# Patient Record
Sex: Male | Born: 1935 | Race: White | Hispanic: No | Marital: Married | Smoking: Former smoker
Health system: Southern US, Community
[De-identification: ages and names within clinical notes are randomized; demographics above are authoritative.]

## PROBLEM LIST (undated history)

## (undated) DIAGNOSIS — I219 Acute myocardial infarction, unspecified: Secondary | ICD-10-CM

## (undated) DIAGNOSIS — I739 Peripheral vascular disease, unspecified: Secondary | ICD-10-CM

## (undated) DIAGNOSIS — Z9981 Dependence on supplemental oxygen: Secondary | ICD-10-CM

## (undated) DIAGNOSIS — C801 Malignant (primary) neoplasm, unspecified: Secondary | ICD-10-CM

## (undated) DIAGNOSIS — I779 Disorder of arteries and arterioles, unspecified: Secondary | ICD-10-CM

## (undated) DIAGNOSIS — I10 Essential (primary) hypertension: Secondary | ICD-10-CM

## (undated) DIAGNOSIS — Z950 Presence of cardiac pacemaker: Secondary | ICD-10-CM

## (undated) DIAGNOSIS — I480 Paroxysmal atrial fibrillation: Secondary | ICD-10-CM

## (undated) DIAGNOSIS — K2980 Duodenitis without bleeding: Secondary | ICD-10-CM

## (undated) DIAGNOSIS — E785 Hyperlipidemia, unspecified: Secondary | ICD-10-CM

## (undated) DIAGNOSIS — E119 Type 2 diabetes mellitus without complications: Secondary | ICD-10-CM

## (undated) DIAGNOSIS — J449 Chronic obstructive pulmonary disease, unspecified: Secondary | ICD-10-CM

## (undated) DIAGNOSIS — I2510 Atherosclerotic heart disease of native coronary artery without angina pectoris: Secondary | ICD-10-CM

## (undated) DIAGNOSIS — R0602 Shortness of breath: Secondary | ICD-10-CM

## (undated) HISTORY — DX: Presence of cardiac pacemaker: Z95.0

## (undated) HISTORY — DX: Atherosclerotic heart disease of native coronary artery without angina pectoris: I25.10

## (undated) HISTORY — DX: Paroxysmal atrial fibrillation: I48.0

## (undated) HISTORY — DX: Disorder of arteries and arterioles, unspecified: I77.9

## (undated) HISTORY — DX: Acute myocardial infarction, unspecified: I21.9

## (undated) HISTORY — DX: Dependence on supplemental oxygen: Z99.81

## (undated) HISTORY — DX: Duodenitis without bleeding: K29.80

## (undated) HISTORY — DX: Type 2 diabetes mellitus without complications: E11.9

## (undated) HISTORY — DX: Peripheral vascular disease, unspecified: I73.9

## (undated) HISTORY — DX: Shortness of breath: R06.02

## (undated) HISTORY — DX: Chronic obstructive pulmonary disease, unspecified: J44.9

## (undated) HISTORY — DX: Hyperlipidemia, unspecified: E78.5

## (undated) HISTORY — PX: OTHER SURGICAL HISTORY: SHX169

---

## 1989-04-10 DIAGNOSIS — I219 Acute myocardial infarction, unspecified: Secondary | ICD-10-CM

## 1989-04-10 HISTORY — DX: Acute myocardial infarction, unspecified: I21.9

## 1998-04-10 HISTORY — PX: CORONARY ARTERY BYPASS GRAFT: SHX141

## 2000-08-20 ENCOUNTER — Encounter: Payer: Self-pay | Admitting: Internal Medicine

## 2000-08-20 ENCOUNTER — Encounter: Admission: RE | Admit: 2000-08-20 | Discharge: 2000-08-20 | Payer: Self-pay | Admitting: Internal Medicine

## 2000-09-17 ENCOUNTER — Encounter (INDEPENDENT_AMBULATORY_CARE_PROVIDER_SITE_OTHER): Payer: Self-pay | Admitting: Specialist

## 2000-09-17 ENCOUNTER — Other Ambulatory Visit: Admission: RE | Admit: 2000-09-17 | Discharge: 2000-09-17 | Payer: Self-pay | Admitting: Urology

## 2001-08-22 ENCOUNTER — Encounter: Admission: RE | Admit: 2001-08-22 | Discharge: 2001-08-22 | Payer: Self-pay | Admitting: Internal Medicine

## 2001-08-22 ENCOUNTER — Encounter: Payer: Self-pay | Admitting: Internal Medicine

## 2002-01-01 ENCOUNTER — Encounter (INDEPENDENT_AMBULATORY_CARE_PROVIDER_SITE_OTHER): Payer: Self-pay | Admitting: Specialist

## 2002-01-01 ENCOUNTER — Ambulatory Visit (HOSPITAL_COMMUNITY): Admission: RE | Admit: 2002-01-01 | Discharge: 2002-01-01 | Payer: Self-pay | Admitting: *Deleted

## 2003-05-12 ENCOUNTER — Encounter: Admission: RE | Admit: 2003-05-12 | Discharge: 2003-05-12 | Payer: Self-pay | Admitting: Internal Medicine

## 2005-08-11 ENCOUNTER — Encounter: Admission: RE | Admit: 2005-08-11 | Discharge: 2005-08-11 | Payer: Self-pay | Admitting: Internal Medicine

## 2008-07-14 ENCOUNTER — Encounter: Admission: RE | Admit: 2008-07-14 | Discharge: 2008-07-14 | Payer: Self-pay | Admitting: Internal Medicine

## 2008-07-16 ENCOUNTER — Ambulatory Visit: Payer: Self-pay | Admitting: Surgery

## 2008-07-16 ENCOUNTER — Encounter (INDEPENDENT_AMBULATORY_CARE_PROVIDER_SITE_OTHER): Payer: Self-pay | Admitting: Internal Medicine

## 2008-07-16 ENCOUNTER — Ambulatory Visit: Admission: RE | Admit: 2008-07-16 | Discharge: 2008-07-16 | Payer: Self-pay | Admitting: Internal Medicine

## 2008-07-18 ENCOUNTER — Ambulatory Visit (HOSPITAL_COMMUNITY): Admission: RE | Admit: 2008-07-18 | Payer: Medicare Other | Source: Ambulatory Visit | Admitting: Cardiothoracic Surgery

## 2010-04-10 HISTORY — PX: PACEMAKER INSERTION: SHX728

## 2010-04-17 ENCOUNTER — Emergency Department (HOSPITAL_COMMUNITY)
Admission: EM | Admit: 2010-04-17 | Discharge: 2010-04-17 | Payer: Self-pay | Source: Home / Self Care | Admitting: Emergency Medicine

## 2010-04-25 LAB — PROTIME-INR
INR: 1.02 (ref 0.00–1.49)
Prothrombin Time: 13.6 seconds (ref 11.6–15.2)

## 2010-04-25 LAB — CBC
HCT: 44.3 % (ref 39.0–52.0)
Hemoglobin: 14.6 g/dL (ref 13.0–17.0)
MCH: 30.5 pg (ref 26.0–34.0)
MCHC: 33 g/dL (ref 30.0–36.0)
MCV: 92.7 fL (ref 78.0–100.0)
Platelets: 130 10*3/uL — ABNORMAL LOW (ref 150–400)
RBC: 4.78 MIL/uL (ref 4.22–5.81)
RDW: 13.4 % (ref 11.5–15.5)
WBC: 6.8 10*3/uL (ref 4.0–10.5)

## 2010-04-25 LAB — DIFFERENTIAL
Basophils Absolute: 0 10*3/uL (ref 0.0–0.1)
Basophils Relative: 1 % (ref 0–1)
Eosinophils Absolute: 0.3 10*3/uL (ref 0.0–0.7)
Eosinophils Relative: 4 % (ref 0–5)
Lymphocytes Relative: 35 % (ref 12–46)
Lymphs Abs: 2.4 10*3/uL (ref 0.7–4.0)
Monocytes Absolute: 0.7 10*3/uL (ref 0.1–1.0)
Monocytes Relative: 10 % (ref 3–12)
Neutro Abs: 3.4 10*3/uL (ref 1.7–7.7)
Neutrophils Relative %: 50 % (ref 43–77)

## 2010-04-25 LAB — URINALYSIS, ROUTINE W REFLEX MICROSCOPIC
Bilirubin Urine: NEGATIVE
Hgb urine dipstick: NEGATIVE
Ketones, ur: NEGATIVE mg/dL
Nitrite: NEGATIVE
Protein, ur: NEGATIVE mg/dL
Specific Gravity, Urine: 1.022 (ref 1.005–1.030)
Urine Glucose, Fasting: NEGATIVE mg/dL
Urobilinogen, UA: 0.2 mg/dL (ref 0.0–1.0)
pH: 5.5 (ref 5.0–8.0)

## 2010-04-25 LAB — POCT I-STAT, CHEM 8
BUN: 22 mg/dL (ref 6–23)
Calcium, Ion: 1.1 mmol/L — ABNORMAL LOW (ref 1.12–1.32)
Chloride: 104 mEq/L (ref 96–112)
Creatinine, Ser: 1.3 mg/dL (ref 0.4–1.5)
Glucose, Bld: 119 mg/dL — ABNORMAL HIGH (ref 70–99)
HCT: 43 % (ref 39.0–52.0)
Hemoglobin: 14.6 g/dL (ref 13.0–17.0)
Potassium: 3.7 mEq/L (ref 3.5–5.1)
Sodium: 141 mEq/L (ref 135–145)
TCO2: 26 mmol/L (ref 0–100)

## 2010-04-25 LAB — CK TOTAL AND CKMB (NOT AT ARMC)
CK, MB: 6.4 ng/mL (ref 0.3–4.0)
Relative Index: 3.1 — ABNORMAL HIGH (ref 0.0–2.5)
Total CK: 204 U/L (ref 7–232)

## 2010-04-25 LAB — COMPREHENSIVE METABOLIC PANEL
ALT: 17 U/L (ref 0–53)
AST: 25 U/L (ref 0–37)
Albumin: 3.8 g/dL (ref 3.5–5.2)
Alkaline Phosphatase: 43 U/L (ref 39–117)
BUN: 19 mg/dL (ref 6–23)
CO2: 24 mEq/L (ref 19–32)
Calcium: 8.9 mg/dL (ref 8.4–10.5)
Chloride: 106 mEq/L (ref 96–112)
Creatinine, Ser: 1.05 mg/dL (ref 0.4–1.5)
GFR calc Af Amer: >60 mL/min (ref >60)
GFR calc non Af Amer: >60 mL/min (ref >60)
Glucose, Bld: 112 mg/dL — ABNORMAL HIGH (ref 70–99)
Potassium: 4.2 mEq/L (ref 3.5–5.1)
Sodium: 138 mEq/L (ref 135–145)
Total Bilirubin: 1.2 mg/dL (ref 0.3–1.2)
Total Protein: 6.5 g/dL (ref 6.0–8.3)

## 2010-04-25 LAB — MAGNESIUM: Magnesium: 2.2 mg/dL (ref 1.5–2.5)

## 2010-04-25 LAB — TROPONIN I: Troponin I: 0.02 ng/mL (ref 0.00–0.06)

## 2010-04-25 LAB — TSH: TSH: 0.955 u[IU]/mL (ref 0.350–4.500)

## 2010-04-25 LAB — POCT CARDIAC MARKERS
CKMB, poc: 1.5 ng/mL (ref 1.0–8.0)
Myoglobin, poc: 212 ng/mL (ref 12–200)
Troponin i, poc: 0.31 ng/mL (ref 0.00–0.09)

## 2010-04-25 LAB — APTT: aPTT: 28 seconds (ref 24–37)

## 2010-04-25 LAB — HEMOGLOBIN A1C
Hgb A1c MFr Bld: 6.3 % — ABNORMAL HIGH (ref <5.7)
Mean Plasma Glucose: 134 mg/dL — ABNORMAL HIGH (ref <117)

## 2010-06-03 ENCOUNTER — Inpatient Hospital Stay (HOSPITAL_COMMUNITY)
Admission: EM | Admit: 2010-06-03 | Discharge: 2010-06-11 | DRG: 243 | Disposition: A | Discharge status: Home / Self Care | Payer: Medicare Other | Attending: Cardiovascular Disease | Admitting: Cardiovascular Disease

## 2010-06-03 ENCOUNTER — Emergency Department (HOSPITAL_COMMUNITY): Payer: Medicare Other

## 2010-06-03 DIAGNOSIS — I200 Unstable angina: Secondary | ICD-10-CM | POA: Diagnosis present

## 2010-06-03 DIAGNOSIS — I4891 Unspecified atrial fibrillation: Secondary | ICD-10-CM | POA: Diagnosis present

## 2010-06-03 DIAGNOSIS — I959 Hypotension, unspecified: Secondary | ICD-10-CM | POA: Diagnosis present

## 2010-06-03 DIAGNOSIS — J4489 Other specified chronic obstructive pulmonary disease: Secondary | ICD-10-CM | POA: Diagnosis present

## 2010-06-03 DIAGNOSIS — E785 Hyperlipidemia, unspecified: Secondary | ICD-10-CM | POA: Diagnosis present

## 2010-06-03 DIAGNOSIS — I129 Hypertensive chronic kidney disease with stage 1 through stage 4 chronic kidney disease, or unspecified chronic kidney disease: Secondary | ICD-10-CM | POA: Diagnosis present

## 2010-06-03 DIAGNOSIS — J449 Chronic obstructive pulmonary disease, unspecified: Secondary | ICD-10-CM | POA: Diagnosis present

## 2010-06-03 DIAGNOSIS — Z87891 Personal history of nicotine dependence: Secondary | ICD-10-CM

## 2010-06-03 DIAGNOSIS — E119 Type 2 diabetes mellitus without complications: Secondary | ICD-10-CM | POA: Diagnosis present

## 2010-06-03 DIAGNOSIS — I25810 Atherosclerosis of coronary artery bypass graft(s) without angina pectoris: Principal | ICD-10-CM | POA: Diagnosis present

## 2010-06-03 DIAGNOSIS — Z7982 Long term (current) use of aspirin: Secondary | ICD-10-CM

## 2010-06-03 DIAGNOSIS — I495 Sick sinus syndrome: Secondary | ICD-10-CM | POA: Diagnosis present

## 2010-06-03 DIAGNOSIS — N189 Chronic kidney disease, unspecified: Secondary | ICD-10-CM | POA: Diagnosis present

## 2010-06-03 DIAGNOSIS — I252 Old myocardial infarction: Secondary | ICD-10-CM

## 2010-06-03 DIAGNOSIS — N289 Disorder of kidney and ureter, unspecified: Secondary | ICD-10-CM | POA: Diagnosis present

## 2010-06-03 LAB — BASIC METABOLIC PANEL
Chloride: 101 mEq/L (ref 96–112)
Creatinine, Ser: 1.91 mg/dL — ABNORMAL HIGH (ref 0.4–1.5)
GFR calc Af Amer: 42 mL/min — ABNORMAL LOW (ref >60)
Potassium: 4.8 mEq/L (ref 3.5–5.1)
Sodium: 138 mEq/L (ref 135–145)

## 2010-06-03 LAB — DIFFERENTIAL
Basophils Absolute: 0 10*3/uL (ref 0.0–0.1)
Basophils Relative: 0 % (ref 0–1)
Eosinophils Absolute: 0.2 10*3/uL (ref 0.0–0.7)
Eosinophils Relative: 2 % (ref 0–5)
Lymphocytes Relative: 16 % (ref 12–46)
Lymphs Abs: 1.3 10*3/uL (ref 0.7–4.0)
Monocytes Absolute: 0.7 10*3/uL (ref 0.1–1.0)
Monocytes Relative: 8 % (ref 3–12)
Neutro Abs: 6.2 10*3/uL (ref 1.7–7.7)
Neutrophils Relative %: 74 % (ref 43–77)

## 2010-06-03 LAB — CBC
HCT: 44.3 % (ref 39.0–52.0)
Hemoglobin: 14.7 g/dL (ref 13.0–17.0)
MCH: 30.6 pg (ref 26.0–34.0)
MCHC: 33.2 g/dL (ref 30.0–36.0)
MCV: 92.1 fL (ref 78.0–100.0)
Platelets: 141 10*3/uL — ABNORMAL LOW (ref 150–400)
RBC: 4.81 MIL/uL (ref 4.22–5.81)
RDW: 13.2 % (ref 11.5–15.5)
WBC: 8.5 10*3/uL (ref 4.0–10.5)

## 2010-06-03 LAB — POCT CARDIAC MARKERS
CKMB, poc: <1 ng/mL — ABNORMAL LOW (ref 1.0–8.0)
Myoglobin, poc: 86.6 ng/mL (ref 12–200)
Troponin i, poc: 0.2 ng/mL — ABNORMAL HIGH (ref 0.00–0.09)

## 2010-06-03 LAB — CK TOTAL AND CKMB (NOT AT ARMC)
CK, MB: 2.3 ng/mL (ref 0.3–4.0)
Relative Index: INVALID (ref 0.0–2.5)
Total CK: 91 U/L (ref 7–232)

## 2010-06-03 LAB — PROTIME-INR
INR: 0.99 (ref 0.00–1.49)
Prothrombin Time: 13.3 seconds (ref 11.6–15.2)

## 2010-06-04 DIAGNOSIS — D381 Neoplasm of uncertain behavior of trachea, bronchus and lung: Secondary | ICD-10-CM

## 2010-06-04 LAB — CBC
MCH: 30.3 pg (ref 26.0–34.0)
MCHC: 33.2 g/dL (ref 30.0–36.0)
MCV: 91.2 fL (ref 78.0–100.0)
Platelets: 119 10*3/uL — ABNORMAL LOW (ref 150–400)
RBC: 4.79 MIL/uL (ref 4.22–5.81)

## 2010-06-04 LAB — HEPARIN LEVEL (UNFRACTIONATED): Heparin Unfractionated: 0.59 IU/mL (ref 0.30–0.70)

## 2010-06-04 LAB — CARDIAC PANEL(CRET KIN+CKTOT+MB+TROPI)
CK, MB: 2.3 ng/mL (ref 0.3–4.0)
Relative Index: 2 (ref 0.0–2.5)
Troponin I: <0.01 ng/mL (ref 0.00–0.06)

## 2010-06-04 LAB — LIPID PANEL
Triglycerides: 150 mg/dL — ABNORMAL HIGH (ref <150)
VLDL: 30 mg/dL (ref 0–40)

## 2010-06-05 LAB — CBC
HCT: 46.2 % (ref 39.0–52.0)
Hemoglobin: 15.7 g/dL (ref 13.0–17.0)
WBC: 7.4 10*3/uL (ref 4.0–10.5)

## 2010-06-05 LAB — BASIC METABOLIC PANEL
GFR calc non Af Amer: >60 mL/min (ref >60)
Potassium: 4.7 mEq/L (ref 3.5–5.1)
Sodium: 136 mEq/L (ref 135–145)

## 2010-06-06 LAB — CBC
HCT: 42.3 % (ref 39.0–52.0)
MCV: 90.8 fL (ref 78.0–100.0)
RBC: 4.66 MIL/uL (ref 4.22–5.81)
WBC: 6.1 10*3/uL (ref 4.0–10.5)

## 2010-06-06 LAB — POCT ACTIVATED CLOTTING TIME: Activated Clotting Time: 99 seconds

## 2010-06-06 LAB — GLUCOSE, CAPILLARY: Glucose-Capillary: 114 mg/dL — ABNORMAL HIGH (ref 70–99)

## 2010-06-07 LAB — BASIC METABOLIC PANEL
Chloride: 110 mEq/L (ref 96–112)
Creatinine, Ser: 0.94 mg/dL (ref 0.4–1.5)
GFR calc Af Amer: >60 mL/min (ref >60)
GFR calc non Af Amer: >60 mL/min (ref >60)
Potassium: 4.1 mEq/L (ref 3.5–5.1)

## 2010-06-07 LAB — CBC
MCH: 29.9 pg (ref 26.0–34.0)
Platelets: 113 10*3/uL — ABNORMAL LOW (ref 150–400)
RBC: 4.11 MIL/uL — ABNORMAL LOW (ref 4.22–5.81)
WBC: 5.2 10*3/uL (ref 4.0–10.5)

## 2010-06-07 LAB — GLUCOSE, CAPILLARY
Glucose-Capillary: 107 mg/dL — ABNORMAL HIGH (ref 70–99)
Glucose-Capillary: 88 mg/dL (ref 70–99)

## 2010-06-08 LAB — CBC
Hemoglobin: 13.8 g/dL (ref 13.0–17.0)
MCHC: 33.7 g/dL (ref 30.0–36.0)
RDW: 13 % (ref 11.5–15.5)
WBC: 6.7 10*3/uL (ref 4.0–10.5)

## 2010-06-08 LAB — GLUCOSE, CAPILLARY
Glucose-Capillary: 103 mg/dL — ABNORMAL HIGH (ref 70–99)
Glucose-Capillary: 121 mg/dL — ABNORMAL HIGH (ref 70–99)

## 2010-06-08 LAB — APTT: aPTT: 31 seconds (ref 24–37)

## 2010-06-09 ENCOUNTER — Inpatient Hospital Stay (HOSPITAL_COMMUNITY): Payer: Medicare Other

## 2010-06-09 HISTORY — PX: CARDIAC CATHETERIZATION: SHX172

## 2010-06-09 HISTORY — PX: CORONARY ANGIOPLASTY: SHX604

## 2010-06-09 LAB — GLUCOSE, CAPILLARY
Glucose-Capillary: 97 mg/dL (ref 70–99)
Glucose-Capillary: 96 mg/dL (ref 70–99)

## 2010-06-09 LAB — CBC
HCT: 40.9 % (ref 39.0–52.0)
Hemoglobin: 13.7 g/dL (ref 13.0–17.0)
WBC: 7.8 10*3/uL (ref 4.0–10.5)

## 2010-06-10 LAB — GLUCOSE, CAPILLARY: Glucose-Capillary: 106 mg/dL — ABNORMAL HIGH (ref 70–99)

## 2010-06-10 LAB — CBC
HCT: 46.2 % (ref 39.0–52.0)
Hemoglobin: 15.6 g/dL (ref 13.0–17.0)
MCH: 30.5 pg (ref 26.0–34.0)
MCHC: 33.8 g/dL (ref 30.0–36.0)
MCV: 90.2 fL (ref 78.0–100.0)

## 2010-06-10 LAB — BASIC METABOLIC PANEL
Chloride: 104 mEq/L (ref 96–112)
Creatinine, Ser: 0.96 mg/dL (ref 0.4–1.5)
GFR calc Af Amer: >60 mL/min (ref >60)
Potassium: 4.5 mEq/L (ref 3.5–5.1)

## 2010-06-10 LAB — PLATELET INHIBITION P2Y12: Platelet Function  P2Y12: 238 [PRU] (ref 194–418)

## 2010-06-10 LAB — PROTIME-INR: INR: 1 (ref 0.00–1.49)

## 2010-06-11 LAB — BASIC METABOLIC PANEL
Calcium: 9.2 mg/dL (ref 8.4–10.5)
Chloride: 105 mEq/L (ref 96–112)
Creatinine, Ser: 0.89 mg/dL (ref 0.4–1.5)
GFR calc Af Amer: >60 mL/min (ref >60)
Sodium: 141 mEq/L (ref 135–145)

## 2010-06-11 LAB — CBC
HCT: 44.3 % (ref 39.0–52.0)
Hemoglobin: 14.9 g/dL (ref 13.0–17.0)
MCHC: 33.6 g/dL (ref 30.0–36.0)

## 2010-06-11 LAB — GLUCOSE, CAPILLARY: Glucose-Capillary: 132 mg/dL — ABNORMAL HIGH (ref 70–99)

## 2010-06-13 LAB — POCT ACTIVATED CLOTTING TIME: Activated Clotting Time: 588 seconds

## 2010-06-13 NOTE — Procedures (Signed)
NAMEJSOEPH, PODESTA                ACCOUNT NO.:  [id number]  MEDICAL RECORD NO.:  [id number]           PATIENT TYPE:  I  LOCATION:  2917                         FACILITY:  [facility]  PHYSICIAN:  Nicki Guadalajara, M.D.     DATE OF BIRTH:  1930-11-10  DATE OF PROCEDURE:  06/06/2010 DATE OF DISCHARGE:                           CARDIAC CATHETERIZATION   INDICATIONS:  [name] is a 77 year old gentleman, who has established coronary artery disease and in 1991, apparently suffered an MI and underwent CABG revascularization surgery x5 with 2 sequential grafts including an SVG to the OM-1 and OM-2 and SVG to the RCA and PDA. He also had a LIMA to his LAD.  I believe the surgery was done by Dr. Particia Lather.  The patient may have seen Dr. Tyrone Sage at some time. He was admitted to [facility] following a syncopal spell in June 03, 2010.  Of note, while the patient has been in the hospital, he did have prolonged sinus arrest with pauses in greater than 5 seconds.  He also had an abnormal chest x-ray raising the possibility of lung CA which will require further workup.  He was seen by Dr. Cornelius Moras. The patient presents now for cardiac catheterization.  Of note, on June 03, 2010, creatinine was 1.91.  Creatinine did improve with hydration.  PROCEDURE:  After premedication with Versed 1 mg plus fentanyl 25 mcg, the patient was prepped and draped in usual fashion.  His right femoral artery was punctured anteriorly and a 5-French sheath was inserted. Diagnostic cardiac catheterization was done utilizing 5-French Judkins for left and right catheters.  The right catheter was also used for selective angiography into both grafts arising from the aorta.  A LIMA catheter was used for selective angiography into the left internal mammary artery.  A 5-French pigtail catheter was used for RAO ventriculography as well as distal aortography.  Hemostasis was obtained by direct manual  pressure.  HEMODYNAMIC DATA:  Central aortic pressure was 120/68.  Left ventricle pressure 120/70.  ANGIOGRAPHIC DATA:  Left main coronary artery at 70% ostial stenosis and bifurcated into LAD and circumflex.  The LAD was occluded after septal and small diagonal vessel.  The circumflex was occluded near its origin after a small atrial bridge.  The right coronary artery was totally occluded in the proximal to mid segment after giving rise to an anterior RV marginal branch.  The sequential vein graft which had supplied the OM-1 and OM-2 vessel was patent, but seemed to only fill one vessel.  There did appear to be 70% ostial narrowing in this graft arising from the aorta that was smooth.  This did not significantly improve following IC nitroglycerin administration.  This was visualized with the catheter outside the graft, arguing against catheter-induced spasm.  The vein graft which sequentially supplied the RCA and PDA gave rise to the proximal limb and then in the distal aspect of the second limb were in the native distal vessel, there was 80% stenosis.  The LIMA graft supplied the mid LAD and was angiographically normal. The LAD wrapped around the LV apex and there  was filling both distally and antegrade from the graft.  RAO ventriculography revealed preserved global LV function with an EF of 55%.  There was mild mid inferior hypocontractility.  Distal aortography demonstrated 20-30% narrowing in the right renal artery.  The left renal artery is widely patent.  There was no significant aortoiliac disease.  IMPRESSION: 1. Preserved global LV function with mild inferior hypocontractility     with an ejection fraction of approximately 55%. 2. Significant native coronary artery disease with 70% ostial left     main stenosis and total occlusion of the native proximal LAD and     circumflex vessels. 3. Total occlusion of the proximal to mid right coronary artery. 4. Patent LIMA  graft supplying the LAD. 5. Vein graft which previously was a sequential graft supplying the OM-     1 and OM-2, now only seemed to supplied one vessel.  There was     ostial narrowing of 70% not improved with IC nitroglycerin in this     vein graft. 6. The right graft is a sequential graft which supplies the RCA and     distal PDA system.  There was focal 80% stenosis either in the     native distal RCA before the PDA or the distal aspect of the graft. 7. A 20-30% right renal artery narrowing.  RECOMMENDATIONS:  Mr. Petrenko will require permanent pacemaker insertion. He does have renal insufficiency.  He will be hydrated following his diagnostic catheterization.  Plans will be made for him to have his pacemaker inserted prior to a dual antiplatelet therapy for his coronary intervention.  He also will require workup for probable primary lung CA.          ______________________________ Nicki Guadalajara, M.D.     TK/MEDQ  D:  06/06/2010  T:  06/07/2010  Job:  [id number]  cc:   Nanetta Batty, M.D. Salvatore Decent. Cornelius Moras, M.D.  Electronically Signed by Nicki Guadalajara M.D. on 06/13/2010 05:18:05 PM

## 2010-06-17 NOTE — Procedures (Signed)
  Keith, Barker                ACCOUNT NO.:  [id number]  MEDICAL RECORD NO.:  [id number]           PATIENT TYPE:  LOCATION:                                 FACILITY:  PHYSICIAN:  Thereasa Solo. Kanijah Groseclose, M.D. DATE OF BIRTH:  [birth year]  DATE OF PROCEDURE:  06/10/2010 DATE OF DISCHARGE:                           CARDIAC CATHETERIZATION   This 77 year old male had bypass surgery in 1991.  He had a cardiac catheterization 5 days earlier.  It revealed high-grade stenosis in the distal RCA that had to be approached through the vein graft.  Because of renal insufficiency, he was staged and comes back in for intervention.  He has a lung nodule that is going to need probably biopsy/excision in the near future, and because of this decision was made not to use a drug- eluting stent.  PROCEDURE:  After obtaining informed consent, the patient was prepped and draped in the usual sterile fashion exposing the right groin. Following local anesthetic with 1% Xylocaine, the Seldinger technique was employed and a 6-French introducer sheath was placed in the right femoral artery.  A JR-4 guide catheter was used.  It easily cannulated the vein graft to the right coronary artery.  A short Luge wire was then placed through the vein graft down the distal right into the posterolateral vessels.  The lesion was predilated with a 2.5 x 20 TREK balloon at 11 atmospheres for 45 seconds.  Stenting was accomplished with a Vision 2.75 x 23 balloon at 11 atmospheres for 40 seconds and final inflation at 12 atmospheres for 38 seconds.  Postdilatation was accomplished with an [place] TREK 3.0 x 20 balloon at 9 atmospheres for 36 seconds.  The patient had been given IV Angiomax and had an ACT well in excess of 400.  The area that had been 80% narrowed preintervention now appeared to be normal post.  There was TIMI 3 flow both pre and postintervention.  No evidence of dissection, distal embolization, or thrombus  formation was seen, and the graft appeared to be unchanged following the intervention.  The patient is already on Plavix.  A total of 75 mL of contrast was used because of his renal insufficiency.  We will hydrate him and check a BMP in the morning.          ______________________________ Thereasa Solo. Manveer Gomes, M.D.     ABL/MEDQ  D:  06/10/2010  T:  06/11/2010  Job:  [id number]  cc:   Nanetta Batty, M.D. Salvatore Decent. Cornelius Moras, M.D.  Electronically Signed by Julieanne Manson M.D. on 06/17/2010 02:26:30 PM

## 2010-06-22 NOTE — Discharge Summary (Signed)
[name], Barker                ACCOUNT NO.:  [id number]  MEDICAL RECORD NO.:  [id number]           PATIENT TYPE:  I  LOCATION:  6525                         FACILITY:  [facility]  PHYSICIAN:  Nanetta Batty, M.D.   DATE OF BIRTH:  1930-09-24  DATE OF ADMISSION:  06/03/2010 DATE OF DISCHARGE:  06/11/2010                              DISCHARGE SUMMARY   DISCHARGE DIAGNOSES: 1. Unstable angina negative myocardial infarction. 2. Coronary artery disease with bypass grafting in 1991, now with     angioplasty and stent to the vein graft to the right coronary     artery on June 09, 2008, with a nondrug-eluting Vision stent.     a.     Residual disease and ostial vein graft stenosis with 70% OM.     b.     EF 55% at cath. 3. Syncope with near-syncope and syncope 5-second pause of asystole     seen in the CCU.     a.     Permanent transvenous pacemaker placed on July 07, 2010,      [employer] pacemaker in the right subclavian. 4. Acute renal insufficiency, resolved. 5. Left upper lung lobe nodule suspicious for primary lung cancer.     a.     Evaluated by Dr. Laneta Simmers during hospitalization.  Plan had      been to treat his coronary issues first, follow up with Dr. Allyson Sabal,      and then we will send for PET scan, and then follow up with Dr.      Tyrone Sage. 6. Chronic obstructive pulmonary disease with desats to 88% and even     as low as 80% with first ambulation, been improved up to 88% and     85%, this is chronic problem for the patient, wife did not wish to     have oxygen at home. 7. Paroxysmal atrial fibrillation, currently maintaining sinus rhythm.  DISCHARGE CONDITION:  Improved.  PROCEDURES: 1. Combined left heart catheterization on June 06, 2010, with     graft visualization revealing vein graft stenosis to the right     coronary artery of 80%. 2. Percutaneous coronary intervention on June 10, 2010, by Dr. Clarene Duke     with percutaneous transluminal coronary angioplasty  and stent     deployment to the vein graft to the right coronary artery that was     successful with MultiVision non-DES stent. 3. June 08, 2010, placement of permanent transvenous pacemaker St.     Jude by Dr. Ritta Slot in the right subclavian.  DISCHARGE MEDICATIONS:  See medication reconciliation sheet.  Plavix was added to medications.  DISCHARGE INSTRUCTIONS: 1. See pacemaker sheet. 2. Wash cath site with soap and water.  Call if any bleeding,     swelling, or drainage. 3. Low-sodium heart-healthy diet. 4. Follow up Dr. Lynnea Ferrier June 13, 2010, for pacer site check. 5. Follow up with Dr. Allyson Sabal in 2-3 weeks, the office will call with     date and time.  The patient was planning to go to the beach,     instructed no swimming.  LABS AT DISCHARGE:  Hemoglobin 14.9, hematocrit 44.3, WBC 7.1, platelets 138.  Glucose 132, sodium 141, potassium 4.2, chloride 105, CO2 of 27, glucose 118, BUN 11, creatinine 0.89, calcium 9.2.  Cardiac enzymes were negative with CKs ranging 91-107, MB 0.03-0.01.  Lipids; total cholesterol 129, triglycerides 150, HDL 38, LDL 61.  MRSA was negative.  RADIOLOGY:  Initial chest x-ray, possible left upper lobe pulmonary nodule.  Further evaluation with nonemergent CT of chest is recommended. Underlying hyperinflation COPD without acute superimposed process.  CT of the chest without contrast, the plain film, abnormalities corresponds to a spiculated left upper lobe lung nodule, highly suspicious for primary bronchiogenic carcinoma.  Thoracic Surgery consultation is recommended.  Mildly enlarged AP window lymph node is suspicious for nodal metastasis especially given its location.  Superior segment left lower lobe spiculated nodule could represent synchronous primary bronchiogenic carcinoma, probable left adrenal adenomas versus hyperplasia and followup chest x-ray on June 09, 2010, pacemaker placement without complicating features.  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  EKG is sinus rhythm, inferior infarction, age indeterminate.  HOSPITAL COURSE:  A 77 year old white male with a history of coronary artery disease and MI in 1991 with bypass grafting in 1991 and history of left carotid endarterectomy presented to the emergency room on June 03, 2010, for chest pain, which lasted for 30 minutes.  He had, had previous episodes 2 days prior to this admission.  The chest pain, shortness of breath, and dyspnea began on Wednesday prior to this admission and most episodes lasted only 5 minutes, was rated 5/10 on 1- 10 scale at the worst, described as dull substernal several times he had radiation to his jaw.  He had associated blurred vision, hypotension, blood pressure down to 68/41, and decreased heart rate.  He did take 2 baby aspirin and he does state it was similar to an episode he had in January, though he did not pass out with this episode and in January he did.  He denied diaphoresis or palpitations at this time.  In the January episode, he had been scheduled for an outpatient stress test, but because he felt well he cancelled the outpatient stress test.  In January, he had a syncopal episode associated with chest pain.  He was admitted to [facility] on June 03, 2010.  During the night, he had significant sinus arrest, asystole the first 5 seconds at least and it was felt since this was not his first episode of syncope and most likely all were related to asystole, he would need a permanent transvenous pacemaker as well to treat his coronary artery disease.  He was on IV heparin, he underwent cardiac catheterization on June 06, 2010, was found to have an 80% vein graft to the RCA stenosis, EF was 55%.  He also was found to have 70% ostial vein graft to the OM stenosis.  Due to his lung nodule, he was originally seen by Dr. Laneta Simmers and  it was felt it is indeed suspicious for primary lung cancer, but to treat the cardiac issues first and then outpatient followup PET scan and CT and follow up with Dr. Tyrone Sage  The patient had a dual-chamber pacemaker placed a [employer]. Jude by Dr. Lynnea Ferrier on June 08, 2010, tolerated procedure well and in 1 day to recover creatinine was stable though he did have one episode of acute chronic kidney disease.  He underwent cardiac cath and intervention to the vein  graft to the RCA on June 10, 2010, and tolerated the procedure well.  On June 11, 2010, he was stable on morning rounds, he had not yet ambulated.  He had no complaints.  He was anxious to go home.  When he ambulated, his sats were up to 80% on room air, with oxygen it came up to 92% and then 94%.  He was ambulated again later and his sats were 88%, I had him ambulate again and he was 84% to 85%.  His wife stated that there were being followed for this, he had been running with oxygen levels that low, and she did not really want him to have oxygen at home. I discussed this with Dr. Royann Shivers and because he has had issues with this in the past, he felt it was safe to let the patient go home, but will need followup as well especially most likely having lung cancer. The wife and husband were quite anxious to go home without the oxygen.  We will need to evaluate the oxygen levels in our office when they come for followup.  He was given instructions on moving his arms.  I am somewhat concerned about both the patient and his wife, in January they did not wish to think there was anything bad happening with this syncopal episode and then cancelled the workup after they were allowed to leave the ER, now they are in denial that he does have oxygen saturations that are low, he probably would feel much better with oxygen.  Dr. Allyson Sabal and/or Dr. Lynnea Ferrier will need to assess this as an outpatient.     [name]. Annie Paras,  N.P.   ______________________________ Nanetta Batty, M.D.    LRI/MEDQ  D:  06/11/2010  T:  06/12/2010  Job:  [id number]  cc:   Dr. Tyrone Sage Dr. Su Hilt  Electronically Signed by Nada Boozer N.P. on 06/14/2010 05:47:44 PM Electronically Signed by Nanetta Batty M.D. on 06/22/2010 01:58:26 PM

## 2010-06-30 ENCOUNTER — Other Ambulatory Visit: Payer: Self-pay | Admitting: Cardiothoracic Surgery

## 2010-06-30 DIAGNOSIS — R918 Other nonspecific abnormal finding of lung field: Secondary | ICD-10-CM

## 2010-07-08 ENCOUNTER — Ambulatory Visit (HOSPITAL_COMMUNITY)
Admission: RE | Admit: 2010-07-08 | Discharge: 2010-07-08 | Disposition: A | Discharge status: Home / Self Care | Payer: Medicare Other | Source: Ambulatory Visit | Attending: Cardiothoracic Surgery | Admitting: Cardiothoracic Surgery

## 2010-07-08 ENCOUNTER — Encounter (HOSPITAL_COMMUNITY): Payer: Self-pay

## 2010-07-08 DIAGNOSIS — D3500 Benign neoplasm of unspecified adrenal gland: Secondary | ICD-10-CM | POA: Insufficient documentation

## 2010-07-08 DIAGNOSIS — Z950 Presence of cardiac pacemaker: Secondary | ICD-10-CM | POA: Insufficient documentation

## 2010-07-08 DIAGNOSIS — R918 Other nonspecific abnormal finding of lung field: Secondary | ICD-10-CM

## 2010-07-08 DIAGNOSIS — N400 Enlarged prostate without lower urinary tract symptoms: Secondary | ICD-10-CM | POA: Insufficient documentation

## 2010-07-08 DIAGNOSIS — I7090 Unspecified atherosclerosis: Secondary | ICD-10-CM | POA: Insufficient documentation

## 2010-07-08 DIAGNOSIS — K5730 Diverticulosis of large intestine without perforation or abscess without bleeding: Secondary | ICD-10-CM | POA: Insufficient documentation

## 2010-07-08 DIAGNOSIS — K8020 Calculus of gallbladder without cholecystitis without obstruction: Secondary | ICD-10-CM | POA: Insufficient documentation

## 2010-07-08 DIAGNOSIS — R599 Enlarged lymph nodes, unspecified: Secondary | ICD-10-CM | POA: Insufficient documentation

## 2010-07-08 DIAGNOSIS — N289 Disorder of kidney and ureter, unspecified: Secondary | ICD-10-CM | POA: Insufficient documentation

## 2010-07-08 DIAGNOSIS — M898X9 Other specified disorders of bone, unspecified site: Secondary | ICD-10-CM | POA: Insufficient documentation

## 2010-07-08 DIAGNOSIS — J438 Other emphysema: Secondary | ICD-10-CM | POA: Insufficient documentation

## 2010-07-08 DIAGNOSIS — J320 Chronic maxillary sinusitis: Secondary | ICD-10-CM | POA: Insufficient documentation

## 2010-07-08 DIAGNOSIS — J984 Other disorders of lung: Secondary | ICD-10-CM | POA: Insufficient documentation

## 2010-07-08 HISTORY — DX: Essential (primary) hypertension: I10

## 2010-07-08 LAB — PULMONARY FUNCTION TEST

## 2010-07-08 LAB — GLUCOSE, CAPILLARY: Glucose-Capillary: 110 mg/dL — ABNORMAL HIGH (ref 70–99)

## 2010-07-08 MED ORDER — FLUDEOXYGLUCOSE F - 18 (FDG) INJECTION
17.5000 | Freq: Once | INTRAVENOUS | Status: AC | PRN
  Administered 2010-07-08: 17.5 via INTRAVENOUS

## 2010-07-14 ENCOUNTER — Encounter (INDEPENDENT_AMBULATORY_CARE_PROVIDER_SITE_OTHER): Payer: Medicare Other | Admitting: Cardiothoracic Surgery

## 2010-07-14 DIAGNOSIS — D381 Neoplasm of uncertain behavior of trachea, bronchus and lung: Secondary | ICD-10-CM

## 2010-07-18 ENCOUNTER — Other Ambulatory Visit: Payer: Self-pay | Admitting: Cardiothoracic Surgery

## 2010-07-18 ENCOUNTER — Ambulatory Visit (HOSPITAL_COMMUNITY)
Admission: RE | Admit: 2010-07-18 | Discharge: 2010-07-18 | Disposition: A | Discharge status: Home / Self Care | Payer: Medicare Other | Source: Ambulatory Visit | Attending: Cardiothoracic Surgery | Admitting: Cardiothoracic Surgery

## 2010-07-18 ENCOUNTER — Encounter (HOSPITAL_COMMUNITY)
Admission: RE | Admit: 2010-07-18 | Discharge: 2010-07-18 | Disposition: A | Discharge status: Home / Self Care | Payer: Medicare Other | Source: Ambulatory Visit | Attending: Cardiothoracic Surgery | Admitting: Cardiothoracic Surgery

## 2010-07-18 DIAGNOSIS — R918 Other nonspecific abnormal finding of lung field: Secondary | ICD-10-CM

## 2010-07-18 DIAGNOSIS — Z01818 Encounter for other preprocedural examination: Secondary | ICD-10-CM | POA: Insufficient documentation

## 2010-07-18 DIAGNOSIS — Z01812 Encounter for preprocedural laboratory examination: Secondary | ICD-10-CM | POA: Insufficient documentation

## 2010-07-18 DIAGNOSIS — Z01810 Encounter for preprocedural cardiovascular examination: Secondary | ICD-10-CM | POA: Insufficient documentation

## 2010-07-18 DIAGNOSIS — J984 Other disorders of lung: Secondary | ICD-10-CM | POA: Insufficient documentation

## 2010-07-18 LAB — CBC
HCT: 43.4 % (ref 39.0–52.0)
Hemoglobin: 14.5 g/dL (ref 13.0–17.0)
MCH: 30.3 pg (ref 26.0–34.0)
MCHC: 33.4 g/dL (ref 30.0–36.0)
MCV: 90.6 fL (ref 78.0–100.0)
Platelets: 134 10*3/uL — ABNORMAL LOW (ref 150–400)
RBC: 4.79 MIL/uL (ref 4.22–5.81)
RDW: 13.8 % (ref 11.5–15.5)
WBC: 7 10*3/uL (ref 4.0–10.5)

## 2010-07-18 LAB — COMPREHENSIVE METABOLIC PANEL
ALT: 17 U/L (ref 0–53)
AST: 19 U/L (ref 0–37)
Albumin: 4.4 g/dL (ref 3.5–5.2)
Alkaline Phosphatase: 51 U/L (ref 39–117)
BUN: 18 mg/dL (ref 6–23)
CO2: 28 mEq/L (ref 19–32)
Calcium: 9.3 mg/dL (ref 8.4–10.5)
Chloride: 106 mEq/L (ref 96–112)
Creatinine, Ser: 1.02 mg/dL (ref 0.4–1.5)
GFR calc Af Amer: >60 mL/min (ref >60)
GFR calc non Af Amer: >60 mL/min (ref >60)
Glucose, Bld: 110 mg/dL — ABNORMAL HIGH (ref 70–99)
Potassium: 4.2 mEq/L (ref 3.5–5.1)
Sodium: 140 mEq/L (ref 135–145)
Total Bilirubin: 1.4 mg/dL — ABNORMAL HIGH (ref 0.3–1.2)
Total Protein: 7 g/dL (ref 6.0–8.3)

## 2010-07-18 LAB — SURGICAL PCR SCREEN
MRSA, PCR: NEGATIVE
Staphylococcus aureus: NEGATIVE

## 2010-07-18 LAB — TYPE AND SCREEN
ABO/RH(D): O NEG
Antibody Screen: NEGATIVE

## 2010-07-18 LAB — ABO/RH: ABO/RH(D): O NEG

## 2010-07-18 LAB — PLATELET INHIBITION P2Y12
P2Y12 % Inhibition: 6 %
Platelet Function  P2Y12: 246 [PRU] (ref 194–418)
Platelet Function Baseline: 262 [PRU] (ref 194–418)

## 2010-07-18 LAB — APTT: aPTT: 28 seconds (ref 24–37)

## 2010-07-18 LAB — PROTIME-INR
INR: 0.97 (ref 0.00–1.49)
Prothrombin Time: 13.1 seconds (ref 11.6–15.2)

## 2010-07-19 ENCOUNTER — Ambulatory Visit (HOSPITAL_COMMUNITY): Payer: Medicare Other

## 2010-07-19 ENCOUNTER — Other Ambulatory Visit: Payer: Self-pay | Admitting: Cardiothoracic Surgery

## 2010-07-19 ENCOUNTER — Ambulatory Visit (HOSPITAL_COMMUNITY)
Admission: RE | Admit: 2010-07-19 | Discharge: 2010-07-19 | Disposition: A | Discharge status: Home / Self Care | Payer: Medicare Other | Source: Ambulatory Visit | Attending: Cardiothoracic Surgery | Admitting: Cardiothoracic Surgery

## 2010-07-19 DIAGNOSIS — J449 Chronic obstructive pulmonary disease, unspecified: Secondary | ICD-10-CM | POA: Insufficient documentation

## 2010-07-19 DIAGNOSIS — Z950 Presence of cardiac pacemaker: Secondary | ICD-10-CM | POA: Insufficient documentation

## 2010-07-19 DIAGNOSIS — D381 Neoplasm of uncertain behavior of trachea, bronchus and lung: Secondary | ICD-10-CM

## 2010-07-19 DIAGNOSIS — Z951 Presence of aortocoronary bypass graft: Secondary | ICD-10-CM | POA: Insufficient documentation

## 2010-07-19 DIAGNOSIS — Z7902 Long term (current) use of antithrombotics/antiplatelets: Secondary | ICD-10-CM | POA: Insufficient documentation

## 2010-07-19 DIAGNOSIS — C801 Malignant (primary) neoplasm, unspecified: Secondary | ICD-10-CM | POA: Insufficient documentation

## 2010-07-19 DIAGNOSIS — Z7982 Long term (current) use of aspirin: Secondary | ICD-10-CM | POA: Insufficient documentation

## 2010-07-19 DIAGNOSIS — I252 Old myocardial infarction: Secondary | ICD-10-CM | POA: Insufficient documentation

## 2010-07-19 DIAGNOSIS — C771 Secondary and unspecified malignant neoplasm of intrathoracic lymph nodes: Secondary | ICD-10-CM | POA: Insufficient documentation

## 2010-07-19 DIAGNOSIS — J4489 Other specified chronic obstructive pulmonary disease: Secondary | ICD-10-CM | POA: Insufficient documentation

## 2010-07-19 LAB — GLUCOSE, CAPILLARY: Glucose-Capillary: 97 mg/dL (ref 70–99)

## 2010-07-21 ENCOUNTER — Encounter (HOSPITAL_BASED_OUTPATIENT_CLINIC_OR_DEPARTMENT_OTHER): Payer: Medicare Other | Admitting: Internal Medicine

## 2010-07-21 DIAGNOSIS — C3490 Malignant neoplasm of unspecified part of unspecified bronchus or lung: Secondary | ICD-10-CM

## 2010-07-21 LAB — CULTURE, RESPIRATORY W GRAM STAIN
Culture: NO GROWTH
Gram Stain: NONE SEEN

## 2010-07-22 ENCOUNTER — Ambulatory Visit (HOSPITAL_COMMUNITY): Payer: Medicare Other

## 2010-07-22 ENCOUNTER — Other Ambulatory Visit (HOSPITAL_COMMUNITY): Payer: Medicare Other

## 2010-07-22 ENCOUNTER — Ambulatory Visit (HOSPITAL_COMMUNITY)
Admission: RE | Admit: 2010-07-22 | Discharge: 2010-07-22 | Disposition: A | Discharge status: Home / Self Care | Payer: Medicare Other | Source: Ambulatory Visit | Attending: Cardiothoracic Surgery | Admitting: Cardiothoracic Surgery

## 2010-07-22 DIAGNOSIS — I2510 Atherosclerotic heart disease of native coronary artery without angina pectoris: Secondary | ICD-10-CM | POA: Insufficient documentation

## 2010-07-22 DIAGNOSIS — C3410 Malignant neoplasm of upper lobe, unspecified bronchus or lung: Secondary | ICD-10-CM | POA: Insufficient documentation

## 2010-07-22 DIAGNOSIS — C3490 Malignant neoplasm of unspecified part of unspecified bronchus or lung: Secondary | ICD-10-CM

## 2010-07-22 LAB — COMPREHENSIVE METABOLIC PANEL
ALT: 17 U/L (ref 0–53)
AST: 21 U/L (ref 0–37)
Albumin: 4.2 g/dL (ref 3.5–5.2)
Alkaline Phosphatase: 57 U/L (ref 39–117)
BUN: 10 mg/dL (ref 6–23)
CO2: 23 mEq/L (ref 19–32)
Calcium: 9 mg/dL (ref 8.4–10.5)
Chloride: 106 mEq/L (ref 96–112)
Creatinine, Ser: 0.9 mg/dL (ref 0.4–1.5)
GFR calc Af Amer: >60 mL/min (ref >60)
GFR calc non Af Amer: >60 mL/min (ref >60)
Glucose, Bld: 128 mg/dL — ABNORMAL HIGH (ref 70–99)
Potassium: 4 mEq/L (ref 3.5–5.1)
Sodium: 137 mEq/L (ref 135–145)
Total Bilirubin: 1.9 mg/dL — ABNORMAL HIGH (ref 0.3–1.2)
Total Protein: 7 g/dL (ref 6.0–8.3)

## 2010-07-22 LAB — PROTIME-INR
INR: 0.98 (ref 0.00–1.49)
Prothrombin Time: 13.2 seconds (ref 11.6–15.2)

## 2010-07-22 LAB — CBC
HCT: 43.7 % (ref 39.0–52.0)
Hemoglobin: 14.8 g/dL (ref 13.0–17.0)
MCH: 30.5 pg (ref 26.0–34.0)
MCHC: 33.9 g/dL (ref 30.0–36.0)
MCV: 90.1 fL (ref 78.0–100.0)
Platelets: 144 10*3/uL — ABNORMAL LOW (ref 150–400)
RBC: 4.85 MIL/uL (ref 4.22–5.81)
RDW: 13.7 % (ref 11.5–15.5)
WBC: 9.9 10*3/uL (ref 4.0–10.5)

## 2010-07-22 LAB — GLUCOSE, CAPILLARY: Glucose-Capillary: 122 mg/dL — ABNORMAL HIGH (ref 70–99)

## 2010-07-22 LAB — APTT: aPTT: 32 seconds (ref 24–37)

## 2010-07-25 LAB — GLUCOSE, CAPILLARY: Glucose-Capillary: 114 mg/dL — ABNORMAL HIGH (ref 70–99)

## 2010-07-26 ENCOUNTER — Other Ambulatory Visit: Payer: Self-pay | Admitting: Internal Medicine

## 2010-07-26 ENCOUNTER — Encounter (HOSPITAL_BASED_OUTPATIENT_CLINIC_OR_DEPARTMENT_OTHER): Payer: Medicare Other | Admitting: Internal Medicine

## 2010-07-26 DIAGNOSIS — C3490 Malignant neoplasm of unspecified part of unspecified bronchus or lung: Secondary | ICD-10-CM

## 2010-07-26 LAB — CBC WITH DIFFERENTIAL/PLATELET
Basophils Absolute: 0 10*3/uL (ref 0.0–0.1)
Eosinophils Absolute: 0.3 10*3/uL (ref 0.0–0.5)
HGB: 14.4 g/dL (ref 13.0–17.1)
MONO#: 0.3 10*3/uL (ref 0.1–0.9)
NEUT#: 4 10*3/uL (ref 1.5–6.5)
RBC: 4.73 10*6/uL (ref 4.20–5.82)
RDW: 14 % (ref 11.0–14.6)
WBC: 5.9 10*3/uL (ref 4.0–10.3)

## 2010-07-26 LAB — COMPREHENSIVE METABOLIC PANEL
Albumin: 4.4 g/dL (ref 3.5–5.2)
Alkaline Phosphatase: 61 U/L (ref 39–117)
BUN: 15 mg/dL (ref 6–23)
CO2: 24 mEq/L (ref 19–32)
Calcium: 9.6 mg/dL (ref 8.4–10.5)
Chloride: 104 mEq/L (ref 96–112)
Glucose, Bld: 100 mg/dL — ABNORMAL HIGH (ref 70–99)
Potassium: 4 mEq/L (ref 3.5–5.3)
Sodium: 139 mEq/L (ref 135–145)
Total Protein: 7.1 g/dL (ref 6.0–8.3)

## 2010-07-27 ENCOUNTER — Encounter (HOSPITAL_BASED_OUTPATIENT_CLINIC_OR_DEPARTMENT_OTHER): Payer: Medicare Other | Admitting: Internal Medicine

## 2010-07-27 DIAGNOSIS — Z5111 Encounter for antineoplastic chemotherapy: Secondary | ICD-10-CM

## 2010-07-27 DIAGNOSIS — C3490 Malignant neoplasm of unspecified part of unspecified bronchus or lung: Secondary | ICD-10-CM

## 2010-07-28 ENCOUNTER — Ambulatory Visit: Payer: Medicare Other | Attending: Radiation Oncology | Admitting: Radiation Oncology

## 2010-07-28 ENCOUNTER — Other Ambulatory Visit (HOSPITAL_COMMUNITY): Payer: Medicare Other

## 2010-07-28 ENCOUNTER — Encounter (HOSPITAL_BASED_OUTPATIENT_CLINIC_OR_DEPARTMENT_OTHER): Payer: Medicare Other | Admitting: Internal Medicine

## 2010-07-28 DIAGNOSIS — E785 Hyperlipidemia, unspecified: Secondary | ICD-10-CM | POA: Insufficient documentation

## 2010-07-28 DIAGNOSIS — Z951 Presence of aortocoronary bypass graft: Secondary | ICD-10-CM | POA: Insufficient documentation

## 2010-07-28 DIAGNOSIS — N400 Enlarged prostate without lower urinary tract symptoms: Secondary | ICD-10-CM | POA: Insufficient documentation

## 2010-07-28 DIAGNOSIS — Z5111 Encounter for antineoplastic chemotherapy: Secondary | ICD-10-CM

## 2010-07-28 DIAGNOSIS — J438 Other emphysema: Secondary | ICD-10-CM | POA: Insufficient documentation

## 2010-07-28 DIAGNOSIS — C3410 Malignant neoplasm of upper lobe, unspecified bronchus or lung: Secondary | ICD-10-CM | POA: Insufficient documentation

## 2010-07-28 DIAGNOSIS — I2510 Atherosclerotic heart disease of native coronary artery without angina pectoris: Secondary | ICD-10-CM | POA: Insufficient documentation

## 2010-07-28 DIAGNOSIS — Z79899 Other long term (current) drug therapy: Secondary | ICD-10-CM | POA: Insufficient documentation

## 2010-07-28 DIAGNOSIS — Z950 Presence of cardiac pacemaker: Secondary | ICD-10-CM | POA: Insufficient documentation

## 2010-07-28 DIAGNOSIS — Z7982 Long term (current) use of aspirin: Secondary | ICD-10-CM | POA: Insufficient documentation

## 2010-07-28 DIAGNOSIS — C3490 Malignant neoplasm of unspecified part of unspecified bronchus or lung: Secondary | ICD-10-CM

## 2010-07-28 DIAGNOSIS — Z510 Encounter for antineoplastic radiation therapy: Secondary | ICD-10-CM | POA: Insufficient documentation

## 2010-07-28 DIAGNOSIS — Z87891 Personal history of nicotine dependence: Secondary | ICD-10-CM | POA: Insufficient documentation

## 2010-07-29 ENCOUNTER — Encounter (HOSPITAL_COMMUNITY): Payer: Self-pay

## 2010-07-29 ENCOUNTER — Ambulatory Visit (HOSPITAL_COMMUNITY)
Admission: RE | Admit: 2010-07-29 | Discharge: 2010-07-29 | Disposition: A | Discharge status: Home / Self Care | Payer: Medicare Other | Source: Ambulatory Visit | Attending: Internal Medicine | Admitting: Internal Medicine

## 2010-07-29 ENCOUNTER — Encounter (HOSPITAL_BASED_OUTPATIENT_CLINIC_OR_DEPARTMENT_OTHER): Payer: Medicare Other | Admitting: Internal Medicine

## 2010-07-29 DIAGNOSIS — C3490 Malignant neoplasm of unspecified part of unspecified bronchus or lung: Secondary | ICD-10-CM | POA: Insufficient documentation

## 2010-07-29 DIAGNOSIS — G319 Degenerative disease of nervous system, unspecified: Secondary | ICD-10-CM | POA: Insufficient documentation

## 2010-07-29 DIAGNOSIS — Z79899 Other long term (current) drug therapy: Secondary | ICD-10-CM | POA: Insufficient documentation

## 2010-07-29 DIAGNOSIS — Z5111 Encounter for antineoplastic chemotherapy: Secondary | ICD-10-CM

## 2010-07-29 HISTORY — DX: Malignant (primary) neoplasm, unspecified: C80.1

## 2010-07-29 MED ORDER — IOHEXOL 300 MG/ML  SOLN
100.0000 mL | Freq: Once | INTRAMUSCULAR | Status: AC | PRN
  Administered 2010-07-29: 100 mL via INTRAVENOUS

## 2010-07-30 ENCOUNTER — Encounter (HOSPITAL_BASED_OUTPATIENT_CLINIC_OR_DEPARTMENT_OTHER): Payer: Medicare Other | Admitting: Internal Medicine

## 2010-07-30 DIAGNOSIS — C3490 Malignant neoplasm of unspecified part of unspecified bronchus or lung: Secondary | ICD-10-CM

## 2010-08-01 NOTE — Op Note (Signed)
  [name], [name]                ACCOUNT NO.:  [id number]  MEDICAL RECORD NO.:  [id number]           PATIENT TYPE:  O  LOCATION:  SDSC                         FACILITY:  [facility]  PHYSICIAN:  Sheliah Plane, MD    DATE OF BIRTH:  Jul 04, 1930  DATE OF PROCEDURE:  07/22/2010 DATE OF DISCHARGE:  07/22/2010                              OPERATIVE REPORT   PREOPERATIVE DIAGNOSIS:  Small cell carcinoma of the lung.  POSTOPERATIVE DIAGNOSIS:  Small cell carcinoma of the lung.  SURGICAL PROCEDURES:  Placement of left subclavian vein Port-A-Cath for vascular access.  SURGEON:  Sheliah Plane, MD  BRIEF HISTORY:  The patient is a 77 year old male who was diagnosed earlier in the week with carcinoma of the lung with a small left upper lobe lung nodule and hilar adenopathy.  On frozen section, this was thought to be non-small cell carcinoma but further testing, the final diagnosis was small cell carcinoma of the lung.  The patient was seen by Dr. [name] and Dr. [name] for evaluation and definitive treatment. Placement of a Port-A-Cath for vascular access was recommended to the patient who agreed and signed informed consent. Previously the patient had had a right subclavian vein, permanent pacemaker placed, so we elected to proceed to the left side.  DESCRIPTION OF PROCEDURE:  With light intravenous sedation and after time-out was performed, the chest was prepped with Betadine and draped in usual sterile manner.  1% lidocaine was infiltrated into the left infraclavicular area and over the left upper chest.  A 16 gauge needle was introduced into the left subclavian vein without difficulty and under fluoroscopic control, the guidewire was placed into this innominate vein and into the superior vena cava.  A counter incision was made over the left upper chest and a subcutaneous pocket created for the carpal port preattached reservoir.  The catheter was trimmed to the estimated final  length.  The port was flushed with heparinized saline and tunneled subcutaneously to the entrance site into the vein. Monitoring fluoroscopically, the dilator and peel-away sheath was placed over the guidewire into the superior vena cava.  The Port-A-Cath was introduced through the peel-away sheath.  The sheath was removed. Fluoroscopically the ports appeared to be in good position.  It aspirated and flushed blood easily.  A concentrated heparin solution 1.2 mL was left instilling in the port and catheter.  The incisions were then closed with interrupted 3-0 Vicryl and a 4-0 subcuticular stitch. Dermabond was placed on both incisions and dressing was applied.  The patient tolerated the procedure without obvious complication and was transferred to the recovery room postoperatively.  He will obtain a PA and lateral chest x-ray to confirm position of the Port-A-Cath for use for chemo starting next week.     Sheliah Plane, MD     EG/MEDQ  D:  07/24/2010  T:  07/24/2010  Job:  [id number]  cc:   Mohamed K. [name], M.D.  Electronically Signed by Sheliah Plane MD on 08/01/2010 11:00:58 AM

## 2010-08-01 NOTE — Op Note (Signed)
[name], [name]                ACCOUNT NO.:  [id number]  MEDICAL RECORD NO.:  [id number]           PATIENT TYPE:  O  LOCATION:  [facility]SC                         FACILITY:  [facility]  PHYSICIAN:  Sheliah Plane, MD    DATE OF BIRTH:  [birth year]  DATE OF PROCEDURE:  07/19/2010 DATE OF DISCHARGE:  07/19/2010                              OPERATIVE REPORT   PREOPERATIVE DIAGNOSIS:  Suspected left upper lobe lung cancer with mediastinal node involvement.  POSTOPERATIVE DIAGNOSIS:  Suspected left upper lobe lung cancer with mediastinal node involvement.  SURGICAL PROCEDURE:  Bronchoscopy, EBUS with transbronchial biopsies.  SURGEONS:  Sheliah Plane, MD/Donald Delrae Alfred, MD  BRIEF HISTORY:  The patient is a 77 year old male who previously had coronary artery bypass grafting.  Last month, he presented to the hospital because of new onset of angina and underwent a coronary stent with a bare-metal stent and also had some significant bradycardia.  A pacemaker was placed and he was discharged home.  During that admission, chest x-ray suggested a small left upper lobe lung nodule suspicious for lung cancer.  The patient was subsequently evaluated in the office including CT and PET scan which confirmed hypermetabolic small lesion in the left upper lobe with enlarged 10 L and 7 L lymph nodes.  To obtain a tissue diagnosis and staging, a bronchoscopy and EBUS was recommended to the patient.  In consultation with his cardiologist, his Plavix was held for several days and will be resumed postoperatively.  DESCRIPTION OF PROCEDURE:  The patient underwent general endotracheal anesthesia.  After standard time-out, a 2.8-mm fiberoptic scope was passed into the tracheobronchial tree and the right and left tracheobronchial tree was examined to the subsegmental level.  There were no obvious endobronchial lesions appreciated.  The cystoscope was then removed and EBUS scope was placed.  With the EBUS  scope identifying lymph nodes the 7 L lymph nodes were identified first just at the takeoff of the left mainstem carina.  Two passes with biopsy needle were taken transbronchial and submitted to pathology.  New needle was then used and the scope was passed just to the carina between the left upper and lower lobe.  The 10 R nodes were identified and two passes with a separate needle were obtained in this area and submitted.  After this, the scope was then brought back to the 7 L level.  An additional two passes with the new needle was performed.  The initial quick interpretation of the cytology confirmed in the 7 L nodes non-small cell carcinoma.  The scope was removed.  The regular bronchoscope was replaced and tracheobronchial trees cleared of secretions.  There was minimal evidence of bleeding.  Scope was removed.  The patient was extubated and transferred to the recovery room having tolerated the procedure without obvious complication.  A followup chest x-ray was obtained in the recovery room.  The patient will resume on his Plavix and will be seen in the multidisciplinary thoracic oncology clinic later this week for definitive planning treatment.     Sheliah Plane, MD     EG/MEDQ  D:  07/19/2010  T:  07/19/2010  Job:  [id number]  cc:   Mohamed K. [name], M.D. Lurline Hare, M.D.  Electronically Signed by Sheliah Plane MD on 08/01/2010 11:00:51 AM

## 2010-08-03 ENCOUNTER — Other Ambulatory Visit: Payer: Self-pay | Admitting: Internal Medicine

## 2010-08-03 ENCOUNTER — Encounter (HOSPITAL_BASED_OUTPATIENT_CLINIC_OR_DEPARTMENT_OTHER): Payer: Medicare Other | Admitting: Internal Medicine

## 2010-08-03 DIAGNOSIS — C3490 Malignant neoplasm of unspecified part of unspecified bronchus or lung: Secondary | ICD-10-CM

## 2010-08-03 LAB — CBC WITH DIFFERENTIAL/PLATELET
Eosinophils Absolute: 0.2 10*3/uL (ref 0.0–0.5)
MCV: 89.2 fL (ref 79.3–98.0)
MONO%: 0.8 % (ref 0.0–14.0)
NEUT#: 2.4 10*3/uL (ref 1.5–6.5)
RBC: 4.5 10*6/uL (ref 4.20–5.82)
RDW: 14 % (ref 11.0–14.6)
WBC: 3.9 10*3/uL — ABNORMAL LOW (ref 4.0–10.3)
lymph#: 1.2 10*3/uL (ref 0.9–3.3)

## 2010-08-03 LAB — COMPREHENSIVE METABOLIC PANEL
AST: 13 U/L (ref 0–37)
Albumin: 4.2 g/dL (ref 3.5–5.2)
Alkaline Phosphatase: 73 U/L (ref 39–117)
Chloride: 100 mEq/L (ref 96–112)
Glucose, Bld: 103 mg/dL — ABNORMAL HIGH (ref 70–99)
Potassium: 4.2 mEq/L (ref 3.5–5.3)
Sodium: 137 mEq/L (ref 135–145)
Total Protein: 6.7 g/dL (ref 6.0–8.3)

## 2010-08-10 ENCOUNTER — Other Ambulatory Visit: Payer: Self-pay | Admitting: Internal Medicine

## 2010-08-10 ENCOUNTER — Encounter (HOSPITAL_BASED_OUTPATIENT_CLINIC_OR_DEPARTMENT_OTHER): Payer: Medicare Other | Admitting: Internal Medicine

## 2010-08-10 DIAGNOSIS — C3490 Malignant neoplasm of unspecified part of unspecified bronchus or lung: Secondary | ICD-10-CM

## 2010-08-10 LAB — CBC WITH DIFFERENTIAL/PLATELET
BASO%: 0 % (ref 0.0–2.0)
Basophils Absolute: 0 10*3/uL (ref 0.0–0.1)
Eosinophils Absolute: 0.1 10*3/uL (ref 0.0–0.5)
HCT: 39.2 % (ref 38.4–49.9)
HGB: 13.4 g/dL (ref 13.0–17.1)
LYMPH%: 12.1 % — ABNORMAL LOW (ref 14.0–49.0)
MCHC: 34 g/dL (ref 32.0–36.0)
MONO#: 0.6 10*3/uL (ref 0.1–0.9)
NEUT%: 83.5 % — ABNORMAL HIGH (ref 39.0–75.0)
Platelets: 70 10*3/uL — ABNORMAL LOW (ref 140–400)
lymph#: 1.8 10*3/uL (ref 0.9–3.3)

## 2010-08-10 LAB — COMPREHENSIVE METABOLIC PANEL
ALT: 22 U/L (ref 0–53)
BUN: 17 mg/dL (ref 6–23)
CO2: 23 mEq/L (ref 19–32)
Calcium: 9.5 mg/dL (ref 8.4–10.5)
Chloride: 104 mEq/L (ref 96–112)
Creatinine, Ser: 0.89 mg/dL (ref 0.40–1.50)
Glucose, Bld: 114 mg/dL — ABNORMAL HIGH (ref 70–99)
Total Bilirubin: 0.4 mg/dL (ref 0.3–1.2)

## 2010-08-15 LAB — FUNGUS CULTURE W SMEAR: Fungal Smear: NONE SEEN

## 2010-08-17 ENCOUNTER — Other Ambulatory Visit: Payer: Self-pay | Admitting: Internal Medicine

## 2010-08-17 ENCOUNTER — Encounter (HOSPITAL_BASED_OUTPATIENT_CLINIC_OR_DEPARTMENT_OTHER): Payer: Medicare Other | Admitting: Internal Medicine

## 2010-08-17 DIAGNOSIS — Z5111 Encounter for antineoplastic chemotherapy: Secondary | ICD-10-CM

## 2010-08-17 DIAGNOSIS — C3490 Malignant neoplasm of unspecified part of unspecified bronchus or lung: Secondary | ICD-10-CM

## 2010-08-17 LAB — COMPREHENSIVE METABOLIC PANEL
ALT: 18 U/L (ref 0–53)
AST: 17 U/L (ref 0–37)
Albumin: 4.1 g/dL (ref 3.5–5.2)
BUN: 22 mg/dL (ref 6–23)
CO2: 24 mEq/L (ref 19–32)
Calcium: 8.6 mg/dL (ref 8.4–10.5)
Chloride: 102 mEq/L (ref 96–112)
Creatinine, Ser: 0.93 mg/dL (ref 0.40–1.50)
Potassium: 3.9 mEq/L (ref 3.5–5.3)

## 2010-08-17 LAB — CBC WITH DIFFERENTIAL/PLATELET
BASO%: 0.5 % (ref 0.0–2.0)
Eosinophils Absolute: 0 10*3/uL (ref 0.0–0.5)
HCT: 37.1 % — ABNORMAL LOW (ref 38.4–49.9)
HGB: 12.5 g/dL — ABNORMAL LOW (ref 13.0–17.1)
LYMPH%: 12.3 % — ABNORMAL LOW (ref 14.0–49.0)
MCHC: 33.7 g/dL (ref 32.0–36.0)
MONO#: 0.5 10*3/uL (ref 0.1–0.9)
NEUT#: 6.9 10*3/uL — ABNORMAL HIGH (ref 1.5–6.5)
NEUT%: 80.7 % — ABNORMAL HIGH (ref 39.0–75.0)
Platelets: 144 10*3/uL (ref 140–400)
WBC: 8.5 10*3/uL (ref 4.0–10.3)
lymph#: 1.1 10*3/uL (ref 0.9–3.3)
nRBC: 0 % (ref 0–0)

## 2010-08-18 ENCOUNTER — Encounter (HOSPITAL_BASED_OUTPATIENT_CLINIC_OR_DEPARTMENT_OTHER): Payer: Medicare Other | Admitting: Internal Medicine

## 2010-08-18 DIAGNOSIS — Z5111 Encounter for antineoplastic chemotherapy: Secondary | ICD-10-CM

## 2010-08-18 DIAGNOSIS — C3490 Malignant neoplasm of unspecified part of unspecified bronchus or lung: Secondary | ICD-10-CM

## 2010-08-19 ENCOUNTER — Encounter (HOSPITAL_BASED_OUTPATIENT_CLINIC_OR_DEPARTMENT_OTHER): Payer: Medicare Other | Admitting: Internal Medicine

## 2010-08-19 DIAGNOSIS — C3490 Malignant neoplasm of unspecified part of unspecified bronchus or lung: Secondary | ICD-10-CM

## 2010-08-19 DIAGNOSIS — Z5111 Encounter for antineoplastic chemotherapy: Secondary | ICD-10-CM

## 2010-08-20 ENCOUNTER — Encounter (HOSPITAL_BASED_OUTPATIENT_CLINIC_OR_DEPARTMENT_OTHER): Payer: Medicare Other | Admitting: Internal Medicine

## 2010-08-20 DIAGNOSIS — C3490 Malignant neoplasm of unspecified part of unspecified bronchus or lung: Secondary | ICD-10-CM

## 2010-08-31 LAB — AFB CULTURE WITH SMEAR (NOT AT ARMC): Acid Fast Smear: NONE SEEN

## 2010-09-02 ENCOUNTER — Ambulatory Visit (HOSPITAL_COMMUNITY)
Admission: RE | Admit: 2010-09-02 | Discharge: 2010-09-02 | Disposition: A | Discharge status: Home / Self Care | Payer: Medicare Other | Source: Ambulatory Visit | Attending: Internal Medicine | Admitting: Internal Medicine

## 2010-09-02 DIAGNOSIS — C3490 Malignant neoplasm of unspecified part of unspecified bronchus or lung: Secondary | ICD-10-CM | POA: Insufficient documentation

## 2010-09-02 DIAGNOSIS — D3500 Benign neoplasm of unspecified adrenal gland: Secondary | ICD-10-CM | POA: Insufficient documentation

## 2010-09-02 DIAGNOSIS — K8020 Calculus of gallbladder without cholecystitis without obstruction: Secondary | ICD-10-CM | POA: Insufficient documentation

## 2010-09-02 DIAGNOSIS — N289 Disorder of kidney and ureter, unspecified: Secondary | ICD-10-CM | POA: Insufficient documentation

## 2010-09-02 DIAGNOSIS — J984 Other disorders of lung: Secondary | ICD-10-CM | POA: Insufficient documentation

## 2010-09-02 DIAGNOSIS — K449 Diaphragmatic hernia without obstruction or gangrene: Secondary | ICD-10-CM | POA: Insufficient documentation

## 2010-09-02 DIAGNOSIS — R0602 Shortness of breath: Secondary | ICD-10-CM | POA: Insufficient documentation

## 2010-09-02 DIAGNOSIS — J438 Other emphysema: Secondary | ICD-10-CM | POA: Insufficient documentation

## 2010-09-02 MED ORDER — IOHEXOL 300 MG/ML  SOLN
100.0000 mL | Freq: Once | INTRAMUSCULAR | Status: AC | PRN
  Administered 2010-09-02: 100 mL via INTRAVENOUS

## 2010-09-07 ENCOUNTER — Encounter (HOSPITAL_BASED_OUTPATIENT_CLINIC_OR_DEPARTMENT_OTHER): Payer: Medicare Other | Admitting: Internal Medicine

## 2010-09-07 ENCOUNTER — Other Ambulatory Visit: Payer: Self-pay | Admitting: Internal Medicine

## 2010-09-07 DIAGNOSIS — C3490 Malignant neoplasm of unspecified part of unspecified bronchus or lung: Secondary | ICD-10-CM

## 2010-09-07 DIAGNOSIS — Z5111 Encounter for antineoplastic chemotherapy: Secondary | ICD-10-CM

## 2010-09-07 LAB — CBC WITH DIFFERENTIAL/PLATELET
BASO%: 0.4 % (ref 0.0–2.0)
EOS%: 1.2 % (ref 0.0–7.0)
MCH: 30 pg (ref 27.2–33.4)
MCHC: 33.4 g/dL (ref 32.0–36.0)
MONO#: 0.6 10*3/uL (ref 0.1–0.9)
RDW: 15.9 % — ABNORMAL HIGH (ref 11.0–14.6)
WBC: 6.9 10*3/uL (ref 4.0–10.3)
lymph#: 0.6 10*3/uL — ABNORMAL LOW (ref 0.9–3.3)

## 2010-09-07 LAB — COMPREHENSIVE METABOLIC PANEL
ALT: 11 U/L (ref 0–53)
AST: 16 U/L (ref 0–37)
Albumin: 4 g/dL (ref 3.5–5.2)
CO2: 24 mEq/L (ref 19–32)
Calcium: 9.2 mg/dL (ref 8.4–10.5)
Chloride: 104 mEq/L (ref 96–112)
Potassium: 3.9 mEq/L (ref 3.5–5.3)
Sodium: 139 mEq/L (ref 135–145)
Total Protein: 6.2 g/dL (ref 6.0–8.3)

## 2010-09-08 ENCOUNTER — Encounter (HOSPITAL_BASED_OUTPATIENT_CLINIC_OR_DEPARTMENT_OTHER): Payer: Medicare Other | Admitting: Internal Medicine

## 2010-09-08 DIAGNOSIS — Z5111 Encounter for antineoplastic chemotherapy: Secondary | ICD-10-CM

## 2010-09-08 DIAGNOSIS — C3490 Malignant neoplasm of unspecified part of unspecified bronchus or lung: Secondary | ICD-10-CM

## 2010-09-09 ENCOUNTER — Encounter (HOSPITAL_BASED_OUTPATIENT_CLINIC_OR_DEPARTMENT_OTHER): Payer: Medicare Other | Admitting: Internal Medicine

## 2010-09-09 DIAGNOSIS — Z5111 Encounter for antineoplastic chemotherapy: Secondary | ICD-10-CM

## 2010-09-09 DIAGNOSIS — C3490 Malignant neoplasm of unspecified part of unspecified bronchus or lung: Secondary | ICD-10-CM

## 2010-09-10 ENCOUNTER — Encounter (HOSPITAL_BASED_OUTPATIENT_CLINIC_OR_DEPARTMENT_OTHER): Payer: Medicare Other | Admitting: Internal Medicine

## 2010-09-10 DIAGNOSIS — C3490 Malignant neoplasm of unspecified part of unspecified bronchus or lung: Secondary | ICD-10-CM

## 2010-09-13 ENCOUNTER — Inpatient Hospital Stay (HOSPITAL_COMMUNITY)
Admission: EM | Admit: 2010-09-13 | Discharge: 2010-09-23 | DRG: 377 | Disposition: A | Discharge status: Home / Self Care | Payer: Medicare Other | Attending: Internal Medicine | Admitting: Internal Medicine

## 2010-09-13 DIAGNOSIS — I2510 Atherosclerotic heart disease of native coronary artery without angina pectoris: Secondary | ICD-10-CM | POA: Diagnosis present

## 2010-09-13 DIAGNOSIS — Z923 Personal history of irradiation: Secondary | ICD-10-CM

## 2010-09-13 DIAGNOSIS — D6481 Anemia due to antineoplastic chemotherapy: Secondary | ICD-10-CM | POA: Diagnosis present

## 2010-09-13 DIAGNOSIS — R1319 Other dysphagia: Secondary | ICD-10-CM | POA: Diagnosis present

## 2010-09-13 DIAGNOSIS — Z951 Presence of aortocoronary bypass graft: Secondary | ICD-10-CM

## 2010-09-13 DIAGNOSIS — K560 Paralytic ileus: Secondary | ICD-10-CM | POA: Diagnosis present

## 2010-09-13 DIAGNOSIS — C3490 Malignant neoplasm of unspecified part of unspecified bronchus or lung: Secondary | ICD-10-CM | POA: Diagnosis present

## 2010-09-13 DIAGNOSIS — Z9221 Personal history of antineoplastic chemotherapy: Secondary | ICD-10-CM

## 2010-09-13 DIAGNOSIS — J449 Chronic obstructive pulmonary disease, unspecified: Secondary | ICD-10-CM | POA: Diagnosis present

## 2010-09-13 DIAGNOSIS — K208 Other esophagitis without bleeding: Secondary | ICD-10-CM | POA: Diagnosis present

## 2010-09-13 DIAGNOSIS — E876 Hypokalemia: Secondary | ICD-10-CM | POA: Diagnosis not present

## 2010-09-13 DIAGNOSIS — Y842 Radiological procedure and radiotherapy as the cause of abnormal reaction of the patient, or of later complication, without mention of misadventure at the time of the procedure: Secondary | ICD-10-CM | POA: Diagnosis present

## 2010-09-13 DIAGNOSIS — Z950 Presence of cardiac pacemaker: Secondary | ICD-10-CM

## 2010-09-13 DIAGNOSIS — D62 Acute posthemorrhagic anemia: Secondary | ICD-10-CM | POA: Diagnosis present

## 2010-09-13 DIAGNOSIS — I10 Essential (primary) hypertension: Secondary | ICD-10-CM | POA: Diagnosis present

## 2010-09-13 DIAGNOSIS — T451X5A Adverse effect of antineoplastic and immunosuppressive drugs, initial encounter: Secondary | ICD-10-CM | POA: Diagnosis present

## 2010-09-13 DIAGNOSIS — K922 Gastrointestinal hemorrhage, unspecified: Principal | ICD-10-CM | POA: Diagnosis present

## 2010-09-13 DIAGNOSIS — J4489 Other specified chronic obstructive pulmonary disease: Secondary | ICD-10-CM | POA: Diagnosis present

## 2010-09-13 DIAGNOSIS — D61810 Antineoplastic chemotherapy induced pancytopenia: Secondary | ICD-10-CM | POA: Diagnosis present

## 2010-09-13 DIAGNOSIS — J189 Pneumonia, unspecified organism: Secondary | ICD-10-CM | POA: Diagnosis present

## 2010-09-13 LAB — ABO/RH: ABO/RH(D): O NEG

## 2010-09-13 LAB — DIFFERENTIAL
Basophils Relative: 0 % (ref 0–1)
Eosinophils Relative: 0 % (ref 0–5)
Lymphocytes Relative: 1 % — ABNORMAL LOW (ref 12–46)
Monocytes Absolute: 0.1 10*3/uL (ref 0.1–1.0)
Monocytes Relative: 1 % — ABNORMAL LOW (ref 3–12)
Neutrophils Relative %: 98 % — ABNORMAL HIGH (ref 43–77)

## 2010-09-13 LAB — CBC
HCT: 17.8 % — ABNORMAL LOW (ref 39.0–52.0)
MCV: 89.4 fL (ref 78.0–100.0)
RBC: 1.99 MIL/uL — ABNORMAL LOW (ref 4.22–5.81)
RDW: 16.5 % — ABNORMAL HIGH (ref 11.5–15.5)
WBC: 14.7 10*3/uL — ABNORMAL HIGH (ref 4.0–10.5)

## 2010-09-13 LAB — COMPREHENSIVE METABOLIC PANEL
BUN: 66 mg/dL — ABNORMAL HIGH (ref 6–23)
Calcium: 8.1 mg/dL — ABNORMAL LOW (ref 8.4–10.5)
Glucose, Bld: 190 mg/dL — ABNORMAL HIGH (ref 70–99)
Sodium: 133 mEq/L — ABNORMAL LOW (ref 135–145)
Total Protein: 4.7 g/dL — ABNORMAL LOW (ref 6.0–8.3)

## 2010-09-13 LAB — PROTIME-INR
INR: 1.21 (ref 0.00–1.49)
Prothrombin Time: 15.5 seconds — ABNORMAL HIGH (ref 11.6–15.2)

## 2010-09-14 LAB — CBC
HCT: 22.9 % — ABNORMAL LOW (ref 39.0–52.0)
Hemoglobin: 8.1 g/dL — ABNORMAL LOW (ref 13.0–17.0)
MCHC: 35.4 g/dL (ref 30.0–36.0)
RDW: 15.7 % — ABNORMAL HIGH (ref 11.5–15.5)
WBC: 5 10*3/uL (ref 4.0–10.5)

## 2010-09-14 LAB — HEMOGLOBIN AND HEMATOCRIT, BLOOD
HCT: 25 % — ABNORMAL LOW (ref 39.0–52.0)
Hemoglobin: 8.7 g/dL — ABNORMAL LOW (ref 13.0–17.0)

## 2010-09-14 LAB — BASIC METABOLIC PANEL
CO2: 27 mEq/L (ref 19–32)
Calcium: 8.2 mg/dL — ABNORMAL LOW (ref 8.4–10.5)
GFR calc Af Amer: >60 mL/min (ref >60)
GFR calc non Af Amer: >60 mL/min (ref >60)
Potassium: 3.8 mEq/L (ref 3.5–5.1)
Sodium: 139 mEq/L (ref 135–145)

## 2010-09-15 ENCOUNTER — Inpatient Hospital Stay (HOSPITAL_COMMUNITY): Payer: Medicare Other

## 2010-09-15 LAB — CBC
Hemoglobin: 6.8 g/dL — CL (ref 13.0–17.0)
MCV: 84 fL (ref 78.0–100.0)
Platelets: 69 10*3/uL — ABNORMAL LOW (ref 150–400)
Platelets: 52 10*3/uL — ABNORMAL LOW (ref 150–400)
RBC: 2.3 MIL/uL — ABNORMAL LOW (ref 4.22–5.81)
RDW: 15.1 % (ref 11.5–15.5)
WBC: 0.9 10*3/uL — CL (ref 4.0–10.5)
WBC: 0.9 10*3/uL — CL (ref 4.0–10.5)

## 2010-09-15 LAB — DIFFERENTIAL
Eosinophils Absolute: 0 10*3/uL (ref 0.0–0.7)
Lymphocytes Relative: 0 % — ABNORMAL LOW (ref 12–46)
Monocytes Absolute: 0 10*3/uL — ABNORMAL LOW (ref 0.1–1.0)
Monocytes Relative: 0 % — ABNORMAL LOW (ref 3–12)
Neutrophils Relative %: 0 % — ABNORMAL LOW (ref 43–77)

## 2010-09-15 LAB — BASIC METABOLIC PANEL
CO2: 27 mEq/L (ref 19–32)
Chloride: 107 mEq/L (ref 96–112)
GFR calc Af Amer: >60 mL/min (ref >60)
Potassium: 3.7 mEq/L (ref 3.5–5.1)
Sodium: 137 mEq/L (ref 135–145)

## 2010-09-16 LAB — CROSSMATCH
Unit division: 0
Unit division: 0

## 2010-09-16 LAB — DIFFERENTIAL
Band Neutrophils: 0 % (ref 0–10)
Basophils Absolute: 0 10*3/uL (ref 0.0–0.1)
Eosinophils Absolute: 0 10*3/uL (ref 0.0–0.7)
Lymphocytes Relative: 0 % — ABNORMAL LOW (ref 12–46)
Monocytes Absolute: 0 10*3/uL — ABNORMAL LOW (ref 0.1–1.0)
Neutrophils Relative %: 0 % — ABNORMAL LOW (ref 43–77)

## 2010-09-16 LAB — MRSA CULTURE

## 2010-09-16 LAB — CBC
MCH: 29.1 pg (ref 26.0–34.0)
MCHC: 34.5 g/dL (ref 30.0–36.0)
Platelets: 42 10*3/uL — ABNORMAL LOW (ref 150–400)
RBC: 3.13 MIL/uL — ABNORMAL LOW (ref 4.22–5.81)

## 2010-09-16 LAB — BASIC METABOLIC PANEL
Calcium: 7.5 mg/dL — ABNORMAL LOW (ref 8.4–10.5)
Chloride: 104 mEq/L (ref 96–112)
Creatinine, Ser: 0.57 mg/dL (ref 0.4–1.5)
GFR calc Af Amer: >60 mL/min (ref >60)

## 2010-09-17 ENCOUNTER — Inpatient Hospital Stay (HOSPITAL_COMMUNITY): Payer: Medicare Other

## 2010-09-17 LAB — DIFFERENTIAL
Eosinophils Absolute: 0 10*3/uL (ref 0.0–0.7)
Lymphocytes Relative: 0 % — ABNORMAL LOW (ref 12–46)
Monocytes Absolute: 0 10*3/uL — ABNORMAL LOW (ref 0.1–1.0)
Neutrophils Relative %: 0 % — ABNORMAL LOW (ref 43–77)

## 2010-09-17 LAB — URINALYSIS, ROUTINE W REFLEX MICROSCOPIC
Glucose, UA: NEGATIVE mg/dL
Ketones, ur: NEGATIVE mg/dL
Leukocytes, UA: NEGATIVE
Specific Gravity, Urine: 1.019 (ref 1.005–1.030)
pH: 5.5 (ref 5.0–8.0)

## 2010-09-17 LAB — ALBUMIN: Albumin: 2.5 g/dL — ABNORMAL LOW (ref 3.5–5.2)

## 2010-09-17 LAB — CBC
HCT: 32.6 % — ABNORMAL LOW (ref 39.0–52.0)
MCH: 29.4 pg (ref 26.0–34.0)
MCV: 85.6 fL (ref 78.0–100.0)
Platelets: 24 10*3/uL — CL (ref 150–400)
Platelets: 17 10*3/uL — CL (ref 150–400)
RDW: 14.7 % (ref 11.5–15.5)
RDW: 14.4 % (ref 11.5–15.5)
WBC: 0.4 10*3/uL — CL (ref 4.0–10.5)

## 2010-09-17 LAB — BASIC METABOLIC PANEL
Chloride: 104 mEq/L (ref 96–112)
GFR calc Af Amer: >60 mL/min (ref >60)
Potassium: 3.8 mEq/L (ref 3.5–5.1)

## 2010-09-17 LAB — MAGNESIUM: Magnesium: 1.9 mg/dL (ref 1.5–2.5)

## 2010-09-18 LAB — CROSSMATCH
ABO/RH(D): O NEG
Unit division: 0

## 2010-09-18 LAB — DIFFERENTIAL
Band Neutrophils: 0 % (ref 0–10)
Basophils Absolute: 0 10*3/uL (ref 0.0–0.1)
Eosinophils Absolute: 0 10*3/uL (ref 0.0–0.7)
Eosinophils Relative: 0 % (ref 0–5)
Lymphs Abs: 0 10*3/uL — ABNORMAL LOW (ref 0.7–4.0)
Monocytes Relative: 0 % — ABNORMAL LOW (ref 3–12)
nRBC: 0 /100 WBC

## 2010-09-18 LAB — URINE CULTURE
Colony Count: NO GROWTH
Culture  Setup Time: 201206092130
Special Requests: NEGATIVE

## 2010-09-18 LAB — COMPREHENSIVE METABOLIC PANEL
CO2: 27 mEq/L (ref 19–32)
Calcium: 5.6 mg/dL — CL (ref 8.4–10.5)
Creatinine, Ser: 0.57 mg/dL (ref 0.4–1.5)
GFR calc Af Amer: >60 mL/min (ref >60)
GFR calc non Af Amer: >60 mL/min (ref >60)
Glucose, Bld: 159 mg/dL — ABNORMAL HIGH (ref 70–99)
Sodium: 137 mEq/L (ref 135–145)
Total Protein: 5 g/dL — ABNORMAL LOW (ref 6.0–8.3)

## 2010-09-18 LAB — PHOSPHORUS: Phosphorus: 2.3 mg/dL (ref 2.3–4.6)

## 2010-09-18 LAB — MAGNESIUM: Magnesium: 1.8 mg/dL (ref 1.5–2.5)

## 2010-09-18 LAB — EXPECTORATED SPUTUM ASSESSMENT W GRAM STAIN, RFLX TO RESP C

## 2010-09-18 LAB — STREP PNEUMONIAE URINARY ANTIGEN: Strep Pneumo Urinary Antigen: NEGATIVE

## 2010-09-19 ENCOUNTER — Inpatient Hospital Stay (HOSPITAL_COMMUNITY): Payer: Medicare Other

## 2010-09-19 LAB — CBC
Platelets: 6 10*3/uL — CL (ref 150–400)
RBC: 3.44 MIL/uL — ABNORMAL LOW (ref 4.22–5.81)
RDW: 14.4 % (ref 11.5–15.5)
WBC: 1.7 10*3/uL — ABNORMAL LOW (ref 4.0–10.5)

## 2010-09-19 LAB — DIFFERENTIAL
Band Neutrophils: 27 % — ABNORMAL HIGH (ref 0–10)
Basophils Absolute: 0 10*3/uL (ref 0.0–0.1)
Basophils Relative: 0 % (ref 0–1)
Blasts: 0 %
Myelocytes: 0 %
Neutro Abs: 1.1 10*3/uL — ABNORMAL LOW (ref 1.7–7.7)
Neutrophils Relative %: 37 % — ABNORMAL LOW (ref 43–77)
Promyelocytes Absolute: 1 %

## 2010-09-19 LAB — COMPREHENSIVE METABOLIC PANEL
AST: 11 U/L (ref 0–37)
CO2: 29 mEq/L (ref 19–32)
Chloride: 102 mEq/L (ref 96–112)
Creatinine, Ser: 0.58 mg/dL (ref 0.4–1.5)
GFR calc Af Amer: >60 mL/min (ref >60)
GFR calc non Af Amer: >60 mL/min (ref >60)
Glucose, Bld: 122 mg/dL — ABNORMAL HIGH (ref 70–99)
Total Bilirubin: 0.5 mg/dL (ref 0.3–1.2)

## 2010-09-19 LAB — LEGIONELLA ANTIGEN, URINE: Legionella Antigen, Urine: NEGATIVE

## 2010-09-20 ENCOUNTER — Inpatient Hospital Stay (HOSPITAL_COMMUNITY): Payer: Medicare Other

## 2010-09-20 LAB — DIFFERENTIAL
Band Neutrophils: 0 % (ref 0–10)
Basophils Absolute: 0.1 10*3/uL (ref 0.0–0.1)
Basophils Relative: 1 % (ref 0–1)
Eosinophils Absolute: 0.1 10*3/uL (ref 0.0–0.7)
Eosinophils Relative: 1 % (ref 0–5)
Metamyelocytes Relative: 0 %
Myelocytes: 0 %

## 2010-09-20 LAB — CBC
Hemoglobin: 10.3 g/dL — ABNORMAL LOW (ref 13.0–17.0)
MCH: 29.3 pg (ref 26.0–34.0)
MCHC: 34.6 g/dL (ref 30.0–36.0)
MCV: 84.9 fL (ref 78.0–100.0)
MCV: 87.4 fL (ref 78.0–100.0)
Platelets: 7 10*3/uL — CL (ref 150–400)
RBC: 3.51 MIL/uL — ABNORMAL LOW (ref 4.22–5.81)
RDW: 14.4 % (ref 11.5–15.5)
WBC: 7 10*3/uL (ref 4.0–10.5)

## 2010-09-20 LAB — BASIC METABOLIC PANEL
Chloride: 103 mEq/L (ref 96–112)
Creatinine, Ser: 0.79 mg/dL (ref 0.4–1.5)
GFR calc Af Amer: >60 mL/min (ref >60)
Potassium: 3.8 mEq/L (ref 3.5–5.1)

## 2010-09-20 LAB — CULTURE, RESPIRATORY W GRAM STAIN: Culture: NORMAL

## 2010-09-20 LAB — VANCOMYCIN, TROUGH: Vancomycin Tr: 15.1 ug/mL (ref 10.0–20.0)

## 2010-09-21 LAB — BASIC METABOLIC PANEL
BUN: 11 mg/dL (ref 6–23)
Calcium: 8.8 mg/dL (ref 8.4–10.5)
Creatinine, Ser: 0.87 mg/dL (ref 0.4–1.5)
GFR calc non Af Amer: >60 mL/min (ref >60)
Glucose, Bld: 96 mg/dL (ref 70–99)
Potassium: 3.6 mEq/L (ref 3.5–5.1)

## 2010-09-21 LAB — CBC
MCH: 29.1 pg (ref 26.0–34.0)
MCHC: 33.4 g/dL (ref 30.0–36.0)
MCV: 86.9 fL (ref 78.0–100.0)
Platelets: 27 10*3/uL — CL (ref 150–400)
RDW: 14.6 % (ref 11.5–15.5)

## 2010-09-21 LAB — DIFFERENTIAL
Band Neutrophils: 0 % (ref 0–10)
Basophils Absolute: 0 10*3/uL (ref 0.0–0.1)
Basophils Relative: 0 % (ref 0–1)
Eosinophils Absolute: 0.1 10*3/uL (ref 0.0–0.7)
Lymphocytes Relative: 1 % — ABNORMAL LOW (ref 12–46)
Lymphs Abs: 0.1 10*3/uL — ABNORMAL LOW (ref 0.7–4.0)
Monocytes Absolute: 0.4 10*3/uL (ref 0.1–1.0)
Monocytes Relative: 4 % (ref 3–12)
Promyelocytes Absolute: 0 %

## 2010-09-21 LAB — PREPARE PLATELET PHERESIS: Unit division: 0

## 2010-09-21 NOTE — Group Therapy Note (Signed)
[name], [name] NO.:  [id number]  MEDICAL RECORD NO.:  [id number]  LOCATION:  1234                         FACILITY:  [facility]  PHYSICIAN:  Ramiro Harvest, MD    DATE OF BIRTH:  03-11-1931                                PROGRESS NOTE   PRIMARY CARE PHYSICIAN: Antony Madura, M.D.  CARDIOLOGIST: Nanetta Batty, M.D.  ONCOLOGIST: Lajuana Matte, M.D.  CURRENT DIAGNOSES: 1. Lower gastrointestinal bleed, likely secondary to presumed     diverticular bleed, improved. 2. Anemia, multifactorial secondary to lower gastrointestinal bleed     and recent chemotherapy status post 6 units packed red blood cells. 3. Pancytopenia secondary to recent chemo.  The patient on Neupogen.     Being followed per oncology. 4. Left basilar pneumonia/healthcare-associated pneumonia, improving. 5. Hypocalcemia likely secondary to vitamin D deficiency, improving. 6. Probable ileus. 7. 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 8. Hypertension. 9. Coronary artery disease status post coronary artery bypass     grafting, stable. 10.Chronic obstructive pulmonary disease. 11.Small cell lung cancer, being followed per oncology.  CONSULTATIONS DONE DURING THIS HOSPITALIZATION: 1. GI consultation was done.  The patient was seen by Dr. Madilyn Fireman of     [facility] on September 13, 2010 and has been followed by Dr. Randa Evens and     Dr. Matthias Hughs of [facility]. 2. Oncology consultation was done.  The patient was seen in     consultation by Dr. Si Gaul of Hematology/Oncology on September 15, 2010.  DISCHARGE MEDICATIONS: Will be dictated per discharging physician.  DISPOSITION AND FOLLOWUP: Will be dictated per discharging physician.  PROCEDURES PERFORMED: 1. EGD was done on September 13, 2010 per Dr. Dorena Cookey that showed small     hiatal hernia, minimal changes of radiation esophagitis, otherwise     normal study with no upper GI source of active bleeding. 2. Colonoscopy was done  on September 16, 2010 per Dr. Carman Ching that     showed pan diverticulosis with a few small balls of what appeared     to be round clots, probably from a diverticular bleed without     active bleeding at this time.  The patient was also transfused 6     units packed red blood cells. 3. Abdominal x-ray was done September 15, 2010 that showed feeding tube     terminated at the proximal stomach.  A Panda tube was placed on     September 15, 2010 and [place] tube was removed on September 19, 2010. 4. Chest x-ray was done September 17, 2010 that showed new left basilar     airspace disease, raises concern for early pneumonia, emphysema,     prior to this is stable. 5. Acute abdominal series done September 19, 2010 shows small bowel     dilatation and air-fluid levels with minimal colonic gas indicative     of small-bowel obstruction, left basilar atelectasis.  BRIEF ADMISSION HISTORY AND PHYSICAL: [name] is a 77 year old pleasant Caucasian gentleman who recently was diagnosed with small-cell lung cancer.  The patient has been receiving chemo with cisplatin, carboplatin and etoposide with last chemo on Friday,  June 1.  The patient had also been receiving radiation therapy.  Last radiation was day prior to admission on September 12, 2010. The patient stated that he had been having fatigue, nausea and pain in the esophagus, likely reflecting esophagitis.  For the last few days, he had noticed blood in his urine and the morning of admission, he went to the bathroom and had significant bright red blood in addition to dark stool in the toilet.  The patient presented to the ED for further evaluation and management.  Denied any chest pain.  No loss of consciousness.  Did have some complaints of dizziness on the morning of admission.  Has had no vomiting but he has been nauseous.  However, this had started since chemo.  The patient had Neulasta but unable to tell on what date he had that.  For the rest of admission history and  physical, please see H&P dictated by Dr. Ashley Royalty of job number [id number].  PROBLEMS: 1. Lower GI bleed.  The patient was admitted with what looks like an     active GI bleed.  He did have marked elevation in his BUN and was     having active bleeding rectally.  The patient was made n.p.o.,     placed on IV fluids, placed on PPI.  He was typed and crossed and     transfused 2 units of packed red blood cells and was placed in the     step-down unit, monitored and followed.  A GI consultation was     obtained.  The patient was seen in consultation by Dr. Madilyn Fireman on     September 13, 2010, at which point in time it was felt the patient would     benefit from an EGD.  The patient did have a EGD which was done on     September 13, 2010 with results as stated above which were negative for a     source of GI bleed.  The patient was monitored and followed during     the hospitalization.  He had his GI bleed slowly improved.     However, his hemoglobin counts kept dropping during the     hospitalization.  His hemoglobin dropped down to 6.0 on the day of     admission where he was transfused 2 units and subsequently dropped     down to 6.8 whereby he was transfused another 2 units and the     patient did have a total of 6 units of packed red blood cells     during this time.  The patient did have a colonoscopy as stated     above, which showed clots of blood with no active bleeding.  It was     felt that the patient did have a presumed diverticular bleed.  His     diverticular bleed slowed down.  The patient has not had any     further bloody bowel movements over the past 2 to 3 days.  His     hemoglobin is currently 10 and his anemia is now subsequently felt     to be multifactorial secondary to lower GI bleed and his     pancytopenia secondary to chemo.  The patient has been placed on     MiraLax indefinitely.  She has been followed per GI during this     hospitalization.  The patient is currently in stable  condition in     terms of his GI  bleed. 2. Anemia.  On admission, the patient was noted to be anemic.     Initially felt the patient's anemia was secondary to acute blood     loss anemia secondary to lower GI bleed.  After the evaluation of     lower GI bleed, the patient with no further GI bleed continued to     have drops in his hemoglobin.  He was transfused a total of 6 units     packed red blood cells.  It was felt that the patient's anemia was     likely multifactorial secondary to GI bleed and his pancytopenia     secondary to recent chemo therapy.  He is being followed also and     transfusion threshold will be for hemoglobin less than 8.  His     hemoglobin as of today is currently stable at 10.0.  This will need     to be monitored and followed. 3. Pancytopenia.  During the hospitalization, the patient was noted to     be pancytopenic.  His white count dropped as low as 0.4.  His     platelets steadily declined down to 6,000.  The patient has been     placed on Neupogen.  He was initially placed on Avelox     prophylactically and it was being followed per oncology during this     hospitalization.  The patient has not had anymore further GI bleed.     Needs to be monitored fairly closely with his severe     thrombocytopenia.  We will defer to oncology as to when to     transfuse the patient platelets.  The patient is currently on     Neupogen.  His antibiotic coverage has been broadened.  He is     currently on vancomycin and Zosyn for presumed new left basilar     pneumonia.  The patient's pancytopenia has been followed per     oncology. 4. Left basilar pneumonia.  During the hospitalization, the patient     continued to have neutropenia.  He was placed on neutropenic     precautions.  He was placed initially empirically on Avelox.  The     patient continued to have some coughing spells.  His white count     continued to decrease and as such, a chest x-ray was obtained.      Chest x-ray which was obtained as stated above did show a new left     basilar pneumonia due to the patient's immunocompromised state and     active GI bleed.  His antibiotic coverage was broadened from Avelox     to vancomycin and Zosyn and he was also placed on Mucinex.  The     patient has improved clinically.  He initially did have Panda tube     placed secondary to odynophagia.  Panda tube has been removed.  The     patient is currently on a full liquid diet and tolerating.  The     patient is improving clinically.  He will maintain on vancomycin     and Zosyn for now and antibiotic coverage can be narrowed.  She     will need a total over 7 to 8 days course of antibiotic therapy. 5. Hypocalcemia.  During the hospitalization, the patient was noted to     be severely hypocalcemic and intact PTH was obtained which came     back elevated at 132.3.  Phosphorus  levels were in the low levels     of normal at 2.3.  It was felt the patient's hypocalcemia was     likely secondary to vitamin D deficiency.  The patient has been     placed on oral calcium and vitamin D with improvement with his     hypocalcemia as of September 20, 2010.  The patient's calcium is 8.4. 6. Probable ileus.  During the hospitalization on September 19, 2010, the     patient did have some abdominal distention, was having some emesis     as well and coughing.  Acute abdominal series was obtained and     there was some concern for small-bowel obstruction.  However, the     patient was passing gas.  The patient was having bowel movements.     Did not have any abdominal pain and was tolerating full liquids.     It was felt that the patient likely did have an ileus.  His     narcotics need to be monitored.  The patient has been mobilized.     Patient is having bowel movements.  No abdominal pain.  His     potassium needs to be kept greater than 4 and magnesium greater     than 2.  The patient needs to be mobilized and minimize  narcotics.     He is also being followed per GI. 7. Odynophagia.  The patient did have some complaints of odynophagia.     It was felt this was likely secondary to radiation esophagitis as     noted on upper EGD.  The patient was placed on Magic mouth wash.     Initially due to his odynophagia, he was placed on Panda tube for     nutrition.  The patient was monitored and Panda tube had     subsequently been removed secondary to his probable ileus.  The     patient is currently tolerating full liquids and this is per GI.     The rest of the patient's chronic issues have remained stable     during this hospitalization.  It has been a pleasure taking care of [name].     Ramiro Harvest, MD     DT/MEDQ  D:  09/20/2010  T:  09/20/2010  Job:  [id number]  cc:   Antony Madura, M.D. Fax: [phone]  Lajuana Matte, M.D. Fax: [phone]  Everardo All. Madilyn Fireman, M.D. Fax: [phone]  Bernette Redbird, M.D. Fax: [phone]  Llana Aliment. Malon Kindle., M.D. Fax: [phone]  Electronically Signed by Ramiro Harvest MD on 09/21/2010 02:50:24 PM

## 2010-09-22 LAB — CBC
MCH: 29.6 pg (ref 26.0–34.0)
MCV: 87.9 fL (ref 78.0–100.0)
Platelets: 25 10*3/uL — CL (ref 150–400)
RBC: 3.07 MIL/uL — ABNORMAL LOW (ref 4.22–5.81)

## 2010-09-23 LAB — CBC
HCT: 25.8 % — ABNORMAL LOW (ref 39.0–52.0)
MCHC: 34.1 g/dL (ref 30.0–36.0)
RDW: 14.5 % (ref 11.5–15.5)

## 2010-09-28 ENCOUNTER — Other Ambulatory Visit: Payer: Self-pay | Admitting: Internal Medicine

## 2010-09-28 ENCOUNTER — Encounter (HOSPITAL_BASED_OUTPATIENT_CLINIC_OR_DEPARTMENT_OTHER): Payer: Medicare Other | Admitting: Internal Medicine

## 2010-09-28 DIAGNOSIS — C3490 Malignant neoplasm of unspecified part of unspecified bronchus or lung: Secondary | ICD-10-CM

## 2010-09-28 DIAGNOSIS — Z5111 Encounter for antineoplastic chemotherapy: Secondary | ICD-10-CM

## 2010-09-28 LAB — CBC WITH DIFFERENTIAL/PLATELET
BASO%: 0.3 % (ref 0.0–2.0)
EOS%: 0.5 % (ref 0.0–7.0)
MCH: 29.6 pg (ref 27.2–33.4)
MCV: 88.4 fL (ref 79.3–98.0)
MONO%: 6.4 % (ref 0.0–14.0)
NEUT#: 9.8 10*3/uL — ABNORMAL HIGH (ref 1.5–6.5)
RBC: 3.72 10*6/uL — ABNORMAL LOW (ref 4.20–5.82)
RDW: 17.4 % — ABNORMAL HIGH (ref 11.0–14.6)
nRBC: 0 % (ref 0–0)

## 2010-09-28 LAB — COMPREHENSIVE METABOLIC PANEL
ALT: 14 U/L (ref 0–53)
Alkaline Phosphatase: 79 U/L (ref 39–117)
Sodium: 142 mEq/L (ref 135–145)
Total Bilirubin: 0.4 mg/dL (ref 0.3–1.2)
Total Protein: 6.5 g/dL (ref 6.0–8.3)

## 2010-09-29 ENCOUNTER — Encounter (HOSPITAL_BASED_OUTPATIENT_CLINIC_OR_DEPARTMENT_OTHER): Payer: Medicare Other | Admitting: Internal Medicine

## 2010-09-29 DIAGNOSIS — C3410 Malignant neoplasm of upper lobe, unspecified bronchus or lung: Secondary | ICD-10-CM

## 2010-09-29 DIAGNOSIS — Z5111 Encounter for antineoplastic chemotherapy: Secondary | ICD-10-CM

## 2010-09-30 ENCOUNTER — Encounter (HOSPITAL_BASED_OUTPATIENT_CLINIC_OR_DEPARTMENT_OTHER): Payer: Medicare Other | Admitting: Internal Medicine

## 2010-09-30 DIAGNOSIS — C3490 Malignant neoplasm of unspecified part of unspecified bronchus or lung: Secondary | ICD-10-CM

## 2010-09-30 DIAGNOSIS — Z5111 Encounter for antineoplastic chemotherapy: Secondary | ICD-10-CM

## 2010-10-01 ENCOUNTER — Encounter (HOSPITAL_BASED_OUTPATIENT_CLINIC_OR_DEPARTMENT_OTHER): Payer: Medicare Other | Admitting: Internal Medicine

## 2010-10-01 DIAGNOSIS — C3410 Malignant neoplasm of upper lobe, unspecified bronchus or lung: Secondary | ICD-10-CM

## 2010-10-01 DIAGNOSIS — Z5111 Encounter for antineoplastic chemotherapy: Secondary | ICD-10-CM

## 2010-10-05 ENCOUNTER — Encounter (HOSPITAL_BASED_OUTPATIENT_CLINIC_OR_DEPARTMENT_OTHER): Payer: Medicare Other | Admitting: Internal Medicine

## 2010-10-05 ENCOUNTER — Other Ambulatory Visit: Payer: Self-pay | Admitting: Internal Medicine

## 2010-10-05 DIAGNOSIS — Z5111 Encounter for antineoplastic chemotherapy: Secondary | ICD-10-CM

## 2010-10-05 DIAGNOSIS — C3490 Malignant neoplasm of unspecified part of unspecified bronchus or lung: Secondary | ICD-10-CM

## 2010-10-05 LAB — CBC WITH DIFFERENTIAL/PLATELET
BASO%: 0.3 % (ref 0.0–2.0)
Eosinophils Absolute: 0 10*3/uL (ref 0.0–0.5)
LYMPH%: 9.8 % — ABNORMAL LOW (ref 14.0–49.0)
MCHC: 34.3 g/dL (ref 32.0–36.0)
MCV: 90.7 fL (ref 79.3–98.0)
MONO%: 0.6 % (ref 0.0–14.0)
NEUT%: 88.7 % — ABNORMAL HIGH (ref 39.0–75.0)
Platelets: 104 10*3/uL — ABNORMAL LOW (ref 140–400)
RBC: 3.39 10*6/uL — ABNORMAL LOW (ref 4.20–5.82)

## 2010-10-05 LAB — COMPREHENSIVE METABOLIC PANEL
Alkaline Phosphatase: 87 U/L (ref 39–117)
Creatinine, Ser: 1.32 mg/dL (ref 0.50–1.35)
Glucose, Bld: 105 mg/dL — ABNORMAL HIGH (ref 70–99)
Sodium: 139 mEq/L (ref 135–145)
Total Bilirubin: 1.4 mg/dL — ABNORMAL HIGH (ref 0.3–1.2)
Total Protein: 6.5 g/dL (ref 6.0–8.3)

## 2010-10-13 ENCOUNTER — Other Ambulatory Visit: Payer: Self-pay | Admitting: Internal Medicine

## 2010-10-13 ENCOUNTER — Ambulatory Visit (HOSPITAL_COMMUNITY)
Admission: RE | Admit: 2010-10-13 | Discharge: 2010-10-13 | Disposition: A | Discharge status: Home / Self Care | Payer: Medicare Other | Source: Ambulatory Visit | Attending: Internal Medicine | Admitting: Internal Medicine

## 2010-10-13 DIAGNOSIS — Z951 Presence of aortocoronary bypass graft: Secondary | ICD-10-CM | POA: Insufficient documentation

## 2010-10-13 DIAGNOSIS — D3500 Benign neoplasm of unspecified adrenal gland: Secondary | ICD-10-CM | POA: Insufficient documentation

## 2010-10-13 DIAGNOSIS — Z950 Presence of cardiac pacemaker: Secondary | ICD-10-CM | POA: Insufficient documentation

## 2010-10-13 DIAGNOSIS — J984 Other disorders of lung: Secondary | ICD-10-CM | POA: Insufficient documentation

## 2010-10-13 DIAGNOSIS — J438 Other emphysema: Secondary | ICD-10-CM | POA: Insufficient documentation

## 2010-10-13 DIAGNOSIS — C3490 Malignant neoplasm of unspecified part of unspecified bronchus or lung: Secondary | ICD-10-CM | POA: Insufficient documentation

## 2010-10-13 DIAGNOSIS — R0789 Other chest pain: Secondary | ICD-10-CM | POA: Insufficient documentation

## 2010-10-13 MED ORDER — IOHEXOL 300 MG/ML  SOLN
100.0000 mL | Freq: Once | INTRAMUSCULAR | Status: AC | PRN
  Administered 2010-10-13: 100 mL via INTRAVENOUS

## 2010-10-13 NOTE — Discharge Summary (Signed)
[name], YELLIN NO.:  [id number]  MEDICAL RECORD NO.:  [id number]  LOCATION:  1421                         FACILITY:  [facility]  PHYSICIAN:  Keith Llano, MD       DATE OF BIRTH:  [birth year]  DATE OF ADMISSION:  09/13/2010 DATE OF DISCHARGE:                              DISCHARGE SUMMARY   PRIMARY CARE PHYSICIAN:  Dr. Burton Barker  ONCOLOGIST:  Dr. Lajuana Barker  REASON FOR ADMISSION:  Bleeding per rectum.  DISCHARGE DIAGNOSES: 1. Lower gastrointestinal bleed likely secondary to a presumed     diverticular bleed, resolved. 2. Anemia, multifactorial secondary to lower GI bleed and recent     chemotherapy status post 6 units of packed RBC transfusion. 3. Pancytopenia secondary to recent chemo. 4. Left basilar pneumonia. 5. Hypocalcemia likely secondary to vitamin D deficiency. 6. Probable ileus. 7. 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 8. Hypertension. 9. Coronary artery disease. 10.Chronic obstructive pulmonary disease. 11.Small-cell cancer, being followed by Oncology.  CONSULTATIONS: 1. GI consultation.  Dr. Matthias Barker from [facility] 2. Oncology consult.  DISCHARGE MEDICATIONS: 1. Multivitamin therapeutic 1 tablet p.o. daily. 2. Sucralfate 1 g per 10 mL suspension 1 g three times a day. 3. Advair Diskus 100/50 1 puff b.i.d. as needed for shortness of     breath. 4. Aspirin enteric coated 325 p.o. daily. 5. Acetaminophen 2 tablets every 4 hours as needed for pain. 6. Plavix 75 mg p.o. daily with meals. 7. Fentanyl patch 25 mcg per hour one patch every 3 days. 8. Hydrocodone/APAP  7.5/500 mg two tablets every 4 hours as needed     for pain. 9. Imdur 30 mg p.o. daily. 10.Lisinopril/hydrochlorothiazide 10/12.5 mg p.o. daily. 11.Metoprolol 25 mg p.o. b.i.d. 12.Nitroglycerin 0.4 every 5 days as needed up to 3 doses for chest     pain. 13.Simvastatin 20 mg p.o. daily. 14.Magic mouthwash with lidocaine 1 teaspoon full by mouth 15 to  20     minutes before meals.  BRIEF HISTORY EXAMINATION:  [name] is 77 year old Caucasian gentleman with recently diagnosed small cell cancer.  The patient was started on chemo with cisplatin, carboplatin and etoposide with last chemo being on September 09, 2010.  The patient also receiving radiation.  The patient came into the hospital complaining of fatigue, nausea and pain in the esophagus.  The patient also noticed some blood in his urine the morning of admission as well as significant bright red blood in the stool in toilet.  The patient presented to the ED for further evaluation.  Denies any chest pain, loss of consciousness, admitted for further evaluation.  RADIOLOGY: 1. Abdominal x-ray on September 20, 2010, showed stable partial small-bowel     obstruction. 2. Abdominal x-ray on September 19, 2010, small bowel dilatation, air-fluid     level with minimal colonic gas indicative of small-bowel     obstruction. 3. Small portable chest x-ray on September 17, 2010, new left basilar     airspace disease.  There is concern about early pneumonia.4. Feeding tube terminating in the proximal pouch.  BRIEF HOSPITAL COURSE: 1. Lower GI bleed.  The patient admitted to the hospital and because  of the bright red blood per rectum, GI was called.  The patient was     made n.p.o. and placed on IV fluids, placed on PPI, type and     crossed, transfused 2 units of packed RBCs and was placed in step-     down unit.  Monitored and followed.  EGD and colonoscopy was done     by GI, they were negative for GI bleed and the bleeding is probably     secondary to chemo.  They also recommended the patient to be on     MiraLax.  The patient and family indicated about that which can be     found as outpatient. 2. Anemia.  On admission, the patient noted to be anemic which was     felt to be secondary to acute blood loss anemia secondary to lower     GI bleed.  After the evaluation of lower GI bleed, the patient had      no further GI bleed and the H and H continued to drop.  The patient     got transfusion of total 6 units of packed RBCs.  Felt the     patient's anemia is likely to be multifactorial secondary to GI     bleed with pancytopenia secondary to recent chemotherapy.  The     patient is being followed every day by CBC.  On the day of     discharge, hemoglobin is 8.8 which has dropped from 10 two days     ago.  The patient will follow up with his oncologist 5 days from     now.  The patient instructed that if he has any symptoms of fatigue     or shortness of breath to call his oncologist to double check his     blood counts but his albumin should recover.  The patient placed on     multivitamins.  No evidence of further GI bleed. 3. Pancytopenia during this hospitalization secondary to recent     chemotherapy.  The patient ended up with 6 units of packed red     blood transfusion.  Neupogen was given for the leukopenia and 1     unit of apheresed platelet was transfused when his platelets dipped     down to 6.  The platelets on the day of discharge is 28 which is     better than the posttransfusion specimen which was 25. 4. Left basilar pneumonia.  During this admission, the patient     continued to have neutropenia, was placed on neutropenic     precautions.  The patient was placed empirically on Avelox and then     chest x-ray showed that he had left lower lobe pneumonia.  The     Avelox was switched to Zosyn and vancomycin.  The patient also was     placed on mucolytics and bronchodilators.  The patient had a total     of 10 days of antibiotics as he was receiving Avelox since     admission and then switched to Zosyn and vanc for 5 days. 5. Hypocalcemia.  The patient noted to be severely hypocalcemic with     intact PTH obtained, came back elevated at 132.3.  Phosphorus level     was low.  It was felt that hypocalcemia likely secondary to vitamin     D deficiency.  The patient has been  placed on oral calcium and     vitamin D.  His hypocalcemia improved.  His calcium is 8.4,     actually the corrected calcium is around 10. 6. Questionable ileus.  The patient did have some abdominal distention     and he had also some had some emesis, abdominal x-ray showed     questionable bowel obstruction.  The patient was placed on NG tube     for suction.  GI, the patient found to have bowel movements of the     advance diet and he was doing fine and the NG tube was removed.     The patient is doing fine. 7. Odynophagia.  Odynophagia is secondary to radiation esophagitis.     The patient placed on Magic mouthwash with lidocaine which helps a     lot.  The patient also on Carafate and the Carafate was switched to     suspension type.  At some point the patient was unable to meet his     nutritional requirements and he required Panda tube for nutrition     which was removed and the patient is eating well right now.  DISCHARGE INSTRUCTIONS: 1. Diet, heart-healthy with soft mechanical diet consistency because     of odynophagia and not dysphagia. 2. Activity as tolerated. 3. Disposition, home.     Keith Llano, MD     ME/MEDQ  D:  09/23/2010  T:  09/23/2010  Job:  [id number]  cc:   Antony Madura, M.D. Fax: [phone]  [name], M.D. Fax: [phone]  Electronically Signed by [name]  on 10/13/2010 01:59:46 PM

## 2010-10-17 NOTE — Op Note (Signed)
[name], [name]                ACCOUNT NO.:  [id number]  MEDICAL RECORD NO.:  [id number]  LOCATION:  1234                         FACILITY:  [facility]  PHYSICIAN:  Amsi Grimley L. Malon Kindle., M.D.DATE OF BIRTH:  06/25/1930  DATE OF PROCEDURE:  09/16/2010 DATE OF DISCHARGE:                              OPERATIVE REPORT   PROCEDURE:  Colonoscopy.  MEDICATIONS:  Fentanyl 50 mcg, Versed 3 mg IV.  SCOPE:  Pediatric colonoscope.  INDICATIONS:  The patient is a very unfortunate 77 year old gentleman who was recently diagnosed with small cell lung cancer, has undergone chemotherapy and radiation therapy.  He came into the hospital with an active GI bleed and has had persistent problems with chest pain with swallowing.  He underwent an endoscopy earlier which showed slight radiation irritation to his esophagus with no active bleeding.  This has continued to be a problem for him.  He has continued to drop his hemoglobin, have a small amount of blood, at times it has been dark and other times bright.  He has been gently cleaned out for colonoscopy. His chest discomfort precluded him drinking the colonoscopy prep, so a Panda tube was inserted for assistance with the prep as well as for enteral feedings.  His white counts have declined to less than 1000 white cells.  The plan was to go ahead and do the colonoscopy if this was an easy procedure or at least a sigmoidoscopy just to make certain that there was no left colon cancer.  This was discussed in detail with the patient and his wife.  If the colonoscopy was difficult in view of the low white count, we would simply just of a sigmoidoscopy.  DESCRIPTION OF PROCEDURE:  The procedure had been explained in detail to the patient's wife and consent obtained.  In the left lateral decubitus position, a digital exam was performed.  The scope was inserted, the prep was quite good.  There were several balls of hard stool and a few that appeared to be  balls of clots in the colon but the prep was really quite good.  We were easily able to advance to the cecum with no pressure, no position changes and no manual abdominal pressure.  It was very easy to reach.  The ileocecal valve and appendiceal orifice were identified.  There was pandiverticulosis throughout the colon but no active bleeding.  No polyps, AVMs or neoplasms were seen.  The scope was withdrawn after the colon was decompressed.  ASSESSMENT:  Pandiverticulosis with a few small balls of what appeared to be round clots probably from a diverticular bleed without active bleeding at this time.  PLAN:  We will keep the patient on MiraLax indefinitely to keep his stools soft and loose and we will go ahead and initiate tube feedings.          ______________________________ Llana Aliment Malon Kindle., M.D.     Waldron Session  D:  09/16/2010  T:  09/16/2010  Job:  [id number]  cc:   Antony Madura, M.D. Fax: [phone]  Nanetta Batty, M.D. Fax: [phone]  Lajuana Matte, M.D. Fax: [phone]  Lurline Hare, M.D.  Electronically Signed by Carman Ching M.D. on  10/17/2010 10:24:47 AM

## 2010-10-18 NOTE — H&P (Signed)
[name], [name] NO.:  [id number]  MEDICAL RECORD NO.:  [id number]           PATIENT TYPE:  LOCATION:                                 FACILITY:  PHYSICIAN:  Sheliah Plane, MD    DATE OF BIRTH:  [birth year]  DATE OF ADMISSION: DATE OF DISCHARGE:                             HISTORY & PHYSICAL   REQUESTING PHYSICIAN:  Nanetta Batty, MD  REASON FOR CONSULTATION:  New left lung lesion.  HISTORY OF PRESENT ILLNESS:  The patient is a 77 year old male known to me from coronary artery bypass grafting in the late 1990s.  He had done well until he presented to [facility] on several occasions with syncope, finally on June 03, 2010, he was admitted and further evaluation found to have greater than 5-second pauses and underwent cardiac catheterization to evaluate his grafts initially placed in 1991. A stent was placed in the distal vein graft to the right with a non drug- eluting stent with a Multi-Link vision cobalt chromium stent and a pacemaker was also placed.  On his chest x-ray relating to the pacemaker placement, a left upper lobe lung nodule was noted.  This led to a CT scan, which suggested a spiculated nodule in the left upper lobe with mediastinal adenopathy.  The patient is now referred as an outpatient for further evaluation of his left upper lobe lung nodule as this is suspicious for primary lung cancer.  Pulmonary functions and PET scan were performed.  PAST MEDICAL HISTORY:  Significant as noted above for, 1. Coronary artery disease. 2. Pacemaker placement. 3. History of cholelithiasis. 4. Left adrenal adenoma. 5. Prostatic hypertrophy. 6. Severe emphysema with baseline FEV-1 of 0.94 and diffusion capacity     in the 26% to 45% range.  CURRENT MEDICATIONS: 1. Plavix 75 mg daily. 2. Advair 100/50 two puffs b.i.d. 3. Simvastatin 20 a day. 4. Lisinopril/hydrochlorothiazide 10/12.5. 5. Aspirin 325 mg. 6. Metoprolol 25 b.i.d. 7.  Hydrocodone. 8. Imdur 30 mg a day. 9. Nitroglycerin p.r.n. 10.Benadryl 25 p.r.n.  He has no known allergies.  Fifteen-point review of systems is completed and significant for shortness of breath with exertion, especially when walking.  He has had no change in bowel or bladder.  Denies hemoptysis.  Did have a pneumococcal vaccination in 2011.  Flu shot in 2011.  He is currently a nonsmoker and has not smoked for 5 years.  PHYSICAL EXAMINATION:  VITAL SIGNS:  The patient's blood pressure 125/76, pulse 76, respiratory rate is 20, O2 sats 90% on room air. NECK:  I do not appreciate any cervical or supraclavicular adenopathy. LUNGS:  He has distant breath sounds. CARDIAC:  Regular rate and rhythm. EXTREMITIES:  He has no calf tenderness. ABDOMEN:  Benign without palpable masses.  The patient's CT scan shows hypermetabolic activity in a relatively small left upper lobe lesion and significant uptake and left hilar nodes and AP window nodes.  At this point, it is highly likely that the patient has a primary carcinoma of the lung with metastasis to the local lymph nodes possibly stage IIIA by scanning.  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  His case has been presented at the [facility].  At this point, I have recommended to him that we proceed with bronchoscopy and EBUS and possible mediastinoscopy to obtain a tissue diagnosis with his poor pulmonary function and what appears to be significant nodal disease, therapy will likely involve radiation and chemotherapy and not primary resection.  I have discussed the probable diagnosis with the patient and he is willing to proceed.     Sheliah Plane, MD     EG/MEDQ  D:  07/14/2010  T:  07/15/2010  Job:  [id number]  cc:   Nanetta Batty, M.D.  Electronically Signed by Sheliah Plane MD on 08/01/2010  11:00:42 AM

## 2010-10-20 ENCOUNTER — Other Ambulatory Visit: Payer: Self-pay | Admitting: Internal Medicine

## 2010-10-20 ENCOUNTER — Encounter (HOSPITAL_BASED_OUTPATIENT_CLINIC_OR_DEPARTMENT_OTHER): Payer: Medicare Other | Admitting: Internal Medicine

## 2010-10-20 DIAGNOSIS — C3490 Malignant neoplasm of unspecified part of unspecified bronchus or lung: Secondary | ICD-10-CM

## 2010-10-20 DIAGNOSIS — C3410 Malignant neoplasm of upper lobe, unspecified bronchus or lung: Secondary | ICD-10-CM

## 2010-10-26 NOTE — Op Note (Signed)
  [name], [name]                ACCOUNT NO.:  [id number]  MEDICAL RECORD NO.:  [id number]  LOCATION:  1234                         FACILITY:  [facility]  PHYSICIAN:  Jobin Montelongo C. Madilyn Fireman, M.D.    DATE OF BIRTH:  05/31/1930  DATE OF PROCEDURE: DATE OF DISCHARGE:                              OPERATIVE REPORT   PROCEDURE:  Esophagogastroduodenoscopy.  INDICATIONS FOR PROCEDURE:  Hematochezia with increased BUN and creatinine ratio and recent history of radiation with symptoms of radiation esophagitis, he is also on Plavix.  PROCEDURE IN DETAIL:  The patient was placed in the left lateral decubitus position and placed on the pulse monitor with continuous low- flow oxygen delivered by nasal cannula.  He was sedated with 50 mcg IV fentanyl and 4 mg IV Versed.  Olympus video endoscope was advanced under direct vision into the oropharynx and esophagus.  The esophagus was straight and of normal caliber with the squamocolumnar line at 38 cm above at 2-cm hiatal hernia.  There appeared to be some mild thickening and mild fibrosis in the middle esophagus consistent with mild radiation esophagitis with no friability or stigma of hemorrhage.  The stomach was entered and a small amount of liquid secretions were suctioned from the fundus.  Retroflexed view of the cardia was unremarkable except for the hiatal hernia.  The fundus, body, antrum, and pylorus all appeared normal.  The duodenum was entered and both bulb and second portion were inspected and appeared to be within normal limits.  The scope was then withdrawn, and the patient returned to the recovery room in stable condition.  He tolerated the procedure well.  There were no immediate complications.  IMPRESSION: 1. Small hiatal hernia. 2. Minimal changes of radiation esophagitis, otherwise normal study     with no upper gastrointestinal source of active bleeding.  PLAN:  Treat his lower GI bleed with colonoscopy in the next 1-3 days.         ______________________________ Everardo All Madilyn Fireman, M.D.     JCH/MEDQ  D:  09/13/2010  T:  09/14/2010  Job:  [id number]  Electronically Signed by Dorena Cookey M.D. on 10/26/2010 07:01:09 PM

## 2010-10-26 NOTE — Consult Note (Signed)
  [name], [name]                ACCOUNT NO.:  [id number]  MEDICAL RECORD NO.:  [id number]  LOCATION:  1234                         FACILITY:  [facility]  PHYSICIAN:  Brekyn Huntoon C. Madilyn Fireman, M.D.    DATE OF BIRTH:  September 08, 1930  DATE OF CONSULTATION:  09/13/2010 DATE OF DISCHARGE:                                CONSULTATION   REASON FOR CONSULTATION:  GI bleeding.  HISTORY OF PRESENT ILLNESS:  The patient is a 77 year old white male recently diagnosed with lung cancer status post of 2 cycles of radiation and cisplatin based chemotherapy who presents with reported painless bright red blood per rectum x4 with near syncope today.  He had hemoglobin 6.0, BUN 66, creatinine 1.2.  Two months ago, his hemoglobin was normal at 14, his BUN was normal at 10.  The patient is on Plavix since having coronary stent placed in February.  He was presenting with syncope at that time and he had a pacemaker placed as well and it was then when his lung cancer was incidentally diagnosed and confirmed by biopsy by Dr. Tyrone Sage.  This is non-small cell cancer being treated by radiation and chemo at this time.  The patient does admit to some odynophagia which he likely relates to his probable radiation esophagitis, although this has not been diagnosed endoscopically.  He had a colonoscopy by Dr. Bosie Clos in 2008 which showed pan- diverticulosis and one small colon polyp.  He has never had an upper endoscopy.  PAST MEDICAL HISTORY:  Bare-metal stent for coronary artery disease placed in February 2012, lung cancer, history of cholelithiasis, prostatic hypertrophy, emphysema, pacemaker implantation  MEDICATIONS:  Plavix, Advair, simvastatin, lisinopril/hydrochlorothiazide, aspirin 325 mg a day, metoprolol 25 mg b.i.d., Imdur 30 mg a day.  PHYSICAL EXAMINATION:  GENERAL:  Pale with scalp alopecia.  Alert, oriented, no acute distress. HEART:  Regular rate and rhythm without murmurs. LUNGS:  Clear. ABDOMEN:  Soft,  nondistended with normoactive bowel sounds.  No hepatosplenomegaly, mass or guarding.  IMPRESSION:  GI bleeding which sounds on the surface like lower GI bleeding but with elevated BUN, dramatic drop in hemoglobin and being on Plavix.  He needs EGD to rule out upper GI bleeding.  PLAN:  We will proceed with EGD later today.  If lower GI bleed, we will manage expectantly at least for a couple of days and then if bleeding persists pursue colonoscopy.          ______________________________ Everardo All. Madilyn Fireman, M.D.     JCH/MEDQ  D:  09/13/2010  T:  09/14/2010  Job:  [id number]  cc:   Lajuana Matte, M.D. Fax: [phone]  Thereasa Solo. Little, M.D. Fax: [phone]  Antony Madura, M.D. Fax: [phone]  Electronically Signed by Dorena Cookey M.D. on 10/26/2010 07:01:06 PM

## 2010-10-27 NOTE — H&P (Signed)
NAMECLAYDEN, Barker NO.:  [id number]  MEDICAL RECORD NO.:  [id number]  LOCATION:  1234                         FACILITY:  [facility]  PHYSICIAN:  Altha Harm, MDDATE OF BIRTH:  07-16-1930  DATE OF ADMISSION:  09/13/2010 DATE OF DISCHARGE:                             HISTORY & PHYSICAL   PRIMARY CARE PHYSICIAN:  Burton Apley, MD  CARDIOLOGIST:  Nanetta Batty, MD  MEDICAL ONCOLOGIST:  Si Gaul, MD  RADIATION ONCOLOGIST:  Lurline Hare, MD  CHIEF COMPLAINT:  Bleeding per rectum.  HISTORY OF PRESENT ILLNESS:  [name] is a 77 year old gentleman who was recently diagnosed with small-cell lung cancer this year.  The patient has been receiving chemotherapy with cisplatin, carboplatin, and etoposide with last chemo on Friday, 6/1.  He has also been receiving radiation therapy, last radiation was yesterday, 09/12/2010.  The patient states he has been having fatigue, nausea, and pain in the esophagus, likely reflecting esophagitis.  For the last few days, he has noticed blood in his urine and this morning when he went bathroom, he had significant bright red blood in addition to dark stool in the toilet.  The patient came to the emergency room for further evaluation and management.  The patient denies any chest pain.  He denies any loss of consciousness.  He does have complaints of dizziness this morning. He has had no vomiting but he has been nauseous.  However, this is since starting his chemotherapy.  The patient had Neulasta but he is unable to tell me the date on which he had it.  PAST MEDICAL HISTORY:  Significant for: 1. Coronary artery disease, status post CABG remotely and recent     stenting. 2. Adrenal adenoma. 3. 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 4. Small-cell lung cancer. 5. COPD. 6. Cholelithiasis.  FAMILY HISTORY:  Significant for coronary artery disease in the father, mother and  sister.  SOCIAL HISTORY:  The patient lives with his wife, [name], who can be reached at [phone].  She is the healthcare power of attorney. There is no tobacco, alcohol or drug use.  CURRENT MEDICATIONS:  While not available at this time, will be reviewed once the pharmacy has reconciled them .  However, the patient is on Plavix and aspirin at home.  ALLERGIES:  No known drug allergies.  REVIEW OF SYSTEMS:  All other systems negative.  STUDIES IN THE EMERGENCY ROOM:  Hemogram shows a white blood cell count of 14.7, hemoglobin of 6.0, hematocrit of 17.8, platelet count of 154. Sodium 133, potassium 4.0, chloride 101, bicarb 23, BUN 66, creatinine 1.12.  Please note that the patient had a normal creatinine of 0.9 on August 08, 2010, and a BUN of 10 at that time.  No radiologic abdominal studies were done and a urinalysis is not done at this time.  PHYSICAL EXAMINATION:  GENERAL:  The patient is very pale appearing, lying in bed in no acute distress. VITAL SIGNS:  Temperature is 98, heart rate 85, blood pressure 103/50, respiratory rate 18, O2 saturations are 99% on 2 L. HEENT:  The patient has conjunctival pallor.  Equal excursion bilaterally.  Pupils equally round and  reactive to light.  Extraocular movements are intact.  Oropharynx is moist.  No exudate, erythema or lesions are noted. NECK:  Trachea is midline.  No masses, no thyromegaly, no JVD, no carotid bruit. CARDIOVASCULAR:  The patient is tachycardic.  No murmurs, rubs or gallops are noted.  PMI is nondisplaced.  No heaves or thrills on palpation. LUNGS:  Clear to auscultation.  No wheezing or rhonchi noted. ABDOMEN:  Obese, soft, nontender, nondistended.  No masses, no hepatosplenomegaly is noted. EXTREMITIES:  No clubbing, cyanosis or edema.  Capillary refill is greater than 5 seconds and the patient has pallor in the nail beds. LYMPH NODE SURVEY:  No cervical, axillary or inguinal  lymphadenopathy noted. MUSCULOSKELETAL:  No warmth, swelling or erythema around the joints. NEUROLOGIC:  No focal neurological deficits noted.  Cranial nerves II- XII are grossly intact. PSYCHIATRIC:  Alert and oriented x3, good insight and cognition, good recent and remote recall.  ASSESSMENT AND PLAN:  This is a patient who presents with what appears to be an active GI bleed.  Given his markedly elevated BUN and the active bleeding that he is having rectally, I think the patient will likely need to have an endoscopy done this evening.  I have asked Dr. Madilyn Fireman to see the patient; he is currently evaluating the patient.  In the meantime, we will type and cross and transfuse the patient 2 units of blood to improve his oxygen-carrying capacity and to replace the ongoing blood loss.  The patient will be admitted to the step-down unit at this time.  In terms of his elevation of white blood cell count, the patient has recently received Neulasta and we will verify that with Dr. [name] tomorrow.  I do not think that this reflects an infectious process, however, we will watch the patient carefully.  The patient does have signs of the blood loss and likely has some orthostatic changes. However, given the fact that the patient is having dizziness, his hemoglobin this low and he is still having active bleeding, we will not ask the patient to stand up to get orthostatics on him, but we will gently give the patient IV fluids in addition to his blood.  I will check an hemoglobin and hematocrit q.6 h on the patient once he has completed endoscopy.  I just spoke with Dr. Madilyn Fireman and he plans to take the patient to upper endoscopy emergently.  In terms of his small-cell lung cancer, we will notify Radiation Oncology and Medical Oncology of the patient's presence here and at this time they will be held until he is more medically stable.  From a cardiovascular standpoint, the patient does not appear to  have any problems, however, given his coronary artery disease it is even more important to have the patient transfused to improve his oxygen-carrying capacity.  In terms of his chronic medications, they will be reviewed and ordered as indicated.  Further testing and therapeutic approach will be dependent on the patient's clinical course and initial testing.     Altha Harm, MD     MAM/MEDQ  D:  09/13/2010  T:  09/13/2010  Job:  [id number]  cc:   Nanetta Batty, M.D. Fax: [phone]  Lurline Hare, M.D.  Lajuana Matte, M.D. Fax: [phone]  Antony Madura, M.D. Fax: [phone]  Electronically Signed by Marthann Schiller MD on 10/27/2010 11:57:29 AM

## 2010-11-03 ENCOUNTER — Ambulatory Visit
Admission: RE | Admit: 2010-11-03 | Discharge: 2010-11-03 | Disposition: A | Discharge status: Home / Self Care | Payer: Medicare Other | Source: Ambulatory Visit | Attending: Radiation Oncology | Admitting: Radiation Oncology

## 2010-11-03 DIAGNOSIS — C3490 Malignant neoplasm of unspecified part of unspecified bronchus or lung: Secondary | ICD-10-CM | POA: Insufficient documentation

## 2010-11-09 ENCOUNTER — Ambulatory Visit
Admission: RE | Admit: 2010-11-09 | Discharge: 2010-11-09 | Disposition: A | Discharge status: Home / Self Care | Payer: Medicare Other | Source: Ambulatory Visit | Attending: Radiation Oncology | Admitting: Radiation Oncology

## 2010-11-09 DIAGNOSIS — L538 Other specified erythematous conditions: Secondary | ICD-10-CM | POA: Insufficient documentation

## 2010-11-09 DIAGNOSIS — R5383 Other fatigue: Secondary | ICD-10-CM | POA: Insufficient documentation

## 2010-11-09 DIAGNOSIS — R110 Nausea: Secondary | ICD-10-CM | POA: Insufficient documentation

## 2010-11-09 DIAGNOSIS — R5381 Other malaise: Secondary | ICD-10-CM | POA: Insufficient documentation

## 2010-11-09 DIAGNOSIS — C3490 Malignant neoplasm of unspecified part of unspecified bronchus or lung: Secondary | ICD-10-CM | POA: Insufficient documentation

## 2010-11-09 DIAGNOSIS — Z510 Encounter for antineoplastic radiation therapy: Secondary | ICD-10-CM | POA: Insufficient documentation

## 2010-12-01 ENCOUNTER — Encounter (HOSPITAL_BASED_OUTPATIENT_CLINIC_OR_DEPARTMENT_OTHER): Payer: Medicare Other | Admitting: Internal Medicine

## 2010-12-01 DIAGNOSIS — C3410 Malignant neoplasm of upper lobe, unspecified bronchus or lung: Secondary | ICD-10-CM

## 2010-12-05 ENCOUNTER — Encounter: Payer: Self-pay | Admitting: Emergency Medicine

## 2010-12-05 ENCOUNTER — Ambulatory Visit (INDEPENDENT_AMBULATORY_CARE_PROVIDER_SITE_OTHER): Payer: Medicare Other | Admitting: Emergency Medicine

## 2010-12-05 DIAGNOSIS — C3490 Malignant neoplasm of unspecified part of unspecified bronchus or lung: Secondary | ICD-10-CM

## 2010-12-05 DIAGNOSIS — J449 Chronic obstructive pulmonary disease, unspecified: Secondary | ICD-10-CM

## 2010-12-05 NOTE — Assessment & Plan Note (Signed)
Following with Drs [name] and [name] - planning for repeat imaging in October

## 2010-12-05 NOTE — Assessment & Plan Note (Signed)
We will start Spiriva 1 inhalation daily Continue your Advair twice a day Please start using your oxygen with all exertion. We will work on getting you a more portable system.  Follow up with Dr Gillis Boardley in 3 -4 weeks to asses your progress.  

## 2010-12-05 NOTE — Progress Notes (Signed)
  Subjective:    Patient ID: [name], male    DOB: Feb 13, 1931, 77 y.o.   MRN: [id number]  HPI 77 yo man, former tobacco 100 pk-yr, CAD s/p CABG and PTCI, hx small cell lung CA in 3/12 s/p chemo and XRT last in 6/12, just completed whole brain radiation. F/u imaging with improvement in the small cell CA, to have repeat CT in October. He presents today with fatigue, exertional dyspnea. Had been on Advair prior to the lung CA. He does not have SABA at this time. He was started on O2, doesn't wear reliably, needs it w exertion.    Review of Systems  Constitutional: Positive for appetite change and unexpected weight change. Negative for fever.  HENT: Negative for ear pain, nosebleeds, congestion, sore throat, rhinorrhea, sneezing, trouble swallowing, dental problem, postnasal drip and sinus pressure.   Eyes: Negative for redness and itching.  Respiratory: Positive for cough and shortness of breath. Negative for chest tightness and wheezing.   Cardiovascular: Negative for palpitations and leg swelling.  Gastrointestinal: Negative for nausea and vomiting.  Genitourinary: Negative for dysuria.  Musculoskeletal: Negative for joint swelling.  Skin: Negative for rash.  Neurological: Negative for headaches.  Hematological: Bruises/bleeds easily.  Psychiatric/Behavioral: Negative for dysphoric mood. The patient is not nervous/anxious.    Past Medical History  Diagnosis Date  . Hypertension   . Cancer     small cell lung cancer  . Heart attack   . Emphysema   . Hyperlipidemia   . Allergic rhinitis   . Heart failure   . Cholelithiasis      Family History  Problem Relation Age of Onset  . Coronary artery disease Mother   . Coronary artery disease Father   . Coronary artery disease Sister      History   Social History  . Marital Status: Married    Spouse Name: N/A    Number of Children: N/A  . Years of Education: N/A   Occupational History  . Not on file.   Social History  Main Topics  . Smoking status: Former Smoker -- 2.0 packs/day for 50 years    Types: Cigarettes    Quit date: 02/08/2006  . Smokeless tobacco: Never Used  . Alcohol Use: Yes     rare  . Drug Use: Not on file  . Sexually Active: Not on file   Other Topics Concern  . Not on file   Social History Narrative  . No narrative on file     No Known Allergies   No outpatient prescriptions prior to visit.        Objective:   Physical Exam  Gen: Pleasant, well-nourished, in no distress,  normal affect  ENT: No lesions,  mouth clear,  oropharynx clear, no postnasal drip  Neck: No JVD, no TMG, no carotid bruits  Lungs: No use of accessory muscles, very distant, no wheeze  Cardiovascular: RRR, heart sounds normal, no murmur or gallops, no peripheral edema  Musculoskeletal: No deformities, no cyanosis or clubbing  Neuro: alert, non focal  Skin: Warm, no lesions or rashes     Assessment & Plan:

## 2010-12-05 NOTE — Patient Instructions (Signed)
We will start Spiriva 1 inhalation daily Continue your Advair twice a day Please start using your oxygen with all exertion. We will work on getting you a more portable system.  Follow up with Dr [name] in 3 -4 weeks to asses your progress.

## 2010-12-29 ENCOUNTER — Ambulatory Visit
Admission: RE | Admit: 2010-12-29 | Discharge: 2010-12-29 | Disposition: A | Discharge status: Home / Self Care | Payer: Medicare Other | Source: Ambulatory Visit | Attending: Radiation Oncology | Admitting: Radiation Oncology

## 2010-12-30 ENCOUNTER — Encounter: Payer: Self-pay | Admitting: Emergency Medicine

## 2010-12-30 ENCOUNTER — Ambulatory Visit (INDEPENDENT_AMBULATORY_CARE_PROVIDER_SITE_OTHER): Payer: Medicare Other | Admitting: Emergency Medicine

## 2010-12-30 VITALS — BP 98/60 | HR 68 | Temp 97.5°F | Ht 68.0 in | Wt 148.8 lb

## 2010-12-30 DIAGNOSIS — J449 Chronic obstructive pulmonary disease, unspecified: Secondary | ICD-10-CM

## 2010-12-30 NOTE — Progress Notes (Signed)
  Subjective:    Patient ID: [name], male    DOB: July 09, 1930, 77 y.o.   MRN: [id number]  HPI 77 yo man, former tobacco 100 pk-yr, CAD s/p CABG and PTCI, hx small cell lung CA in 3/12 s/p chemo and XRT last in 6/12, just completed whole brain radiation. F/u imaging with improvement in the small cell CA, to have repeat CT in October. He presents today with fatigue, exertional dyspnea. Had been on Advair prior to the lung CA. He does not have SABA at this time. He was started on O2, doesn't wear reliably, needs it w exertion.   ROV 12/30/10 -- follow up, hx CAD, tobacco w COPD, small cell lung CA s/p chemo and XRT.  Last time we added Spiriva to Advair - he believes that his breathing is much better. He has not been wearing his his O2 as reliably. He had PFT in April '12. He has some congestion/URI type symptoms. Improved. Still with some UA irritation.    Objective:   Physical Exam  Gen: Pleasant, well-nourished, in no distress,  normal affect  ENT: No lesions,  mouth clear,  oropharynx clear, no postnasal drip, some hoarse voice  Neck: No JVD, no TMG, no carotid bruits  Lungs: No use of accessory muscles, very distant, no wheeze  Cardiovascular: RRR, heart sounds normal, no murmur or gallops, no peripheral edema  Musculoskeletal: No deformities, no cyanosis or clubbing  Neuro: alert, non focal  Skin: Warm, no lesions or rashes     Assessment & Plan:  COPD (chronic obstructive pulmonary disease) Improved on Spiriva + Advair. Has some hoarse voice (on Advair, ACE-I)  - continue these BD's - get him SABA to use prn - walking oximetry today - ROV 6 months or prn

## 2010-12-30 NOTE — Patient Instructions (Signed)
Please continue your Spiriva and Advair as you are taking them Use Ventolin 2 puffs if needed for shortness of breath Walking oximetry today Follow with Dr [name] in 6 months or sooner if you have any problems.

## 2010-12-30 NOTE — Assessment & Plan Note (Signed)
Improved on Spiriva + Advair. Has some hoarse voice (on Advair, ACE-I)  - continue these BD's - get him SABA to use prn - walking oximetry today - ROV 6 months or prn

## 2011-01-10 ENCOUNTER — Telehealth: Payer: Self-pay | Admitting: Emergency Medicine

## 2011-01-10 MED ORDER — TIOTROPIUM BROMIDE MONOHYDRATE 18 MCG IN CAPS: 18.0000 ug | ORAL_CAPSULE | Freq: Every day | RESPIRATORY_TRACT | Status: DC

## 2011-01-10 MED ORDER — ALBUTEROL SULFATE HFA 108 (90 BASE) MCG/ACT IN AERS: 2.0000 | INHALATION_SPRAY | RESPIRATORY_TRACT | Status: DC | PRN

## 2011-01-10 NOTE — Telephone Encounter (Signed)
I spoke with pt wife and she states pt needed a refill on his spiriva and ventolin. I advised will send new rx's and nothing further was needed

## 2011-01-12 ENCOUNTER — Other Ambulatory Visit: Payer: Self-pay | Admitting: Internal Medicine

## 2011-01-12 ENCOUNTER — Ambulatory Visit (HOSPITAL_COMMUNITY)
Admission: RE | Admit: 2011-01-12 | Discharge: 2011-01-12 | Disposition: A | Discharge status: Home / Self Care | Payer: Medicare Other | Source: Ambulatory Visit | Attending: Internal Medicine | Admitting: Internal Medicine

## 2011-01-12 ENCOUNTER — Encounter (HOSPITAL_BASED_OUTPATIENT_CLINIC_OR_DEPARTMENT_OTHER): Payer: Medicare Other | Admitting: Internal Medicine

## 2011-01-12 DIAGNOSIS — C3490 Malignant neoplasm of unspecified part of unspecified bronchus or lung: Secondary | ICD-10-CM | POA: Insufficient documentation

## 2011-01-12 DIAGNOSIS — J438 Other emphysema: Secondary | ICD-10-CM | POA: Insufficient documentation

## 2011-01-12 LAB — CBC WITH DIFFERENTIAL/PLATELET
Eosinophils Absolute: 0.3 10*3/uL (ref 0.0–0.5)
MCV: 87.6 fL (ref 79.3–98.0)
MONO#: 0.4 10*3/uL (ref 0.1–0.9)
MONO%: 8.6 % (ref 0.0–14.0)
NEUT#: 3.6 10*3/uL (ref 1.5–6.5)
RBC: 4.04 10*6/uL — ABNORMAL LOW (ref 4.20–5.82)
RDW: 15.1 % — ABNORMAL HIGH (ref 11.0–14.6)
WBC: 5.1 10*3/uL (ref 4.0–10.3)
lymph#: 0.8 10*3/uL — ABNORMAL LOW (ref 0.9–3.3)
nRBC: 0 % (ref 0–0)

## 2011-01-12 LAB — CMP (CANCER CENTER ONLY)
ALT(SGPT): 15 U/L (ref 10–47)
CO2: 27 mEq/L (ref 18–33)
Potassium: 4.6 mEq/L (ref 3.3–4.7)
Sodium: 142 mEq/L (ref 128–145)
Total Bilirubin: 0.7 mg/dl (ref 0.20–1.60)
Total Protein: 6.7 g/dL (ref 6.4–8.1)

## 2011-01-12 MED ORDER — IOHEXOL 300 MG/ML  SOLN
80.0000 mL | Freq: Once | INTRAMUSCULAR | Status: AC | PRN
  Administered 2011-01-12: 80 mL via INTRAVENOUS

## 2011-01-17 ENCOUNTER — Encounter (HOSPITAL_BASED_OUTPATIENT_CLINIC_OR_DEPARTMENT_OTHER): Payer: Medicare Other | Admitting: Internal Medicine

## 2011-01-17 DIAGNOSIS — Z85118 Personal history of other malignant neoplasm of bronchus and lung: Secondary | ICD-10-CM

## 2011-01-17 DIAGNOSIS — F411 Generalized anxiety disorder: Secondary | ICD-10-CM

## 2011-01-17 DIAGNOSIS — R110 Nausea: Secondary | ICD-10-CM

## 2011-01-28 ENCOUNTER — Emergency Department (HOSPITAL_COMMUNITY): Payer: Medicare Other

## 2011-01-28 ENCOUNTER — Inpatient Hospital Stay (HOSPITAL_COMMUNITY)
Admission: EM | Admit: 2011-01-28 | Discharge: 2011-02-03 | DRG: 381 | Disposition: A | Discharge status: Home / Self Care | Payer: Medicare Other | Attending: Internal Medicine | Admitting: Internal Medicine

## 2011-01-28 DIAGNOSIS — J449 Chronic obstructive pulmonary disease, unspecified: Secondary | ICD-10-CM | POA: Diagnosis present

## 2011-01-28 DIAGNOSIS — I2510 Atherosclerotic heart disease of native coronary artery without angina pectoris: Secondary | ICD-10-CM | POA: Diagnosis present

## 2011-01-28 DIAGNOSIS — I9589 Other hypotension: Secondary | ICD-10-CM | POA: Diagnosis present

## 2011-01-28 DIAGNOSIS — I4891 Unspecified atrial fibrillation: Secondary | ICD-10-CM | POA: Diagnosis present

## 2011-01-28 DIAGNOSIS — C3410 Malignant neoplasm of upper lobe, unspecified bronchus or lung: Secondary | ICD-10-CM | POA: Diagnosis present

## 2011-01-28 DIAGNOSIS — N400 Enlarged prostate without lower urinary tract symptoms: Secondary | ICD-10-CM | POA: Diagnosis present

## 2011-01-28 DIAGNOSIS — J4489 Other specified chronic obstructive pulmonary disease: Secondary | ICD-10-CM | POA: Diagnosis present

## 2011-01-28 DIAGNOSIS — K315 Obstruction of duodenum: Principal | ICD-10-CM | POA: Diagnosis present

## 2011-01-28 DIAGNOSIS — E559 Vitamin D deficiency, unspecified: Secondary | ICD-10-CM | POA: Diagnosis present

## 2011-01-28 DIAGNOSIS — K2980 Duodenitis without bleeding: Secondary | ICD-10-CM | POA: Diagnosis present

## 2011-01-28 DIAGNOSIS — Z9861 Coronary angioplasty status: Secondary | ICD-10-CM

## 2011-01-28 DIAGNOSIS — R112 Nausea with vomiting, unspecified: Secondary | ICD-10-CM | POA: Diagnosis present

## 2011-01-28 DIAGNOSIS — N179 Acute kidney failure, unspecified: Secondary | ICD-10-CM | POA: Diagnosis present

## 2011-01-28 DIAGNOSIS — Z950 Presence of cardiac pacemaker: Secondary | ICD-10-CM

## 2011-01-28 DIAGNOSIS — Z923 Personal history of irradiation: Secondary | ICD-10-CM

## 2011-01-28 DIAGNOSIS — E860 Dehydration: Secondary | ICD-10-CM | POA: Diagnosis present

## 2011-01-28 DIAGNOSIS — E119 Type 2 diabetes mellitus without complications: Secondary | ICD-10-CM | POA: Diagnosis present

## 2011-01-28 DIAGNOSIS — T502X5A Adverse effect of carbonic-anhydrase inhibitors, benzothiadiazides and other diuretics, initial encounter: Secondary | ICD-10-CM | POA: Diagnosis present

## 2011-01-28 LAB — DIFFERENTIAL
Basophils Absolute: 0 10*3/uL (ref 0.0–0.1)
Eosinophils Absolute: 0.1 10*3/uL (ref 0.0–0.7)
Eosinophils Relative: 2 % (ref 0–5)

## 2011-01-28 LAB — COMPREHENSIVE METABOLIC PANEL
ALT: 10 U/L (ref 0–53)
Alkaline Phosphatase: 63 U/L (ref 39–117)
CO2: 26 mEq/L (ref 19–32)
Calcium: 9.2 mg/dL (ref 8.4–10.5)
GFR calc Af Amer: 43 mL/min — ABNORMAL LOW (ref >90)
GFR calc non Af Amer: 37 mL/min — ABNORMAL LOW (ref >90)
Glucose, Bld: 102 mg/dL — ABNORMAL HIGH (ref 70–99)
Potassium: 4 mEq/L (ref 3.5–5.1)
Sodium: 132 mEq/L — ABNORMAL LOW (ref 135–145)

## 2011-01-28 LAB — CBC
Platelets: 159 10*3/uL (ref 150–400)
RDW: 15.3 % (ref 11.5–15.5)
WBC: 6 10*3/uL (ref 4.0–10.5)

## 2011-01-28 LAB — POCT I-STAT TROPONIN I: Troponin i, poc: 0.01 ng/mL (ref 0.00–0.08)

## 2011-01-29 LAB — GLUCOSE, CAPILLARY
Glucose-Capillary: 97 mg/dL (ref 70–99)
Glucose-Capillary: 120 mg/dL — ABNORMAL HIGH (ref 70–99)

## 2011-01-29 LAB — BASIC METABOLIC PANEL
Calcium: 8.7 mg/dL (ref 8.4–10.5)
Chloride: 103 mEq/L (ref 96–112)
Creatinine, Ser: 1.28 mg/dL (ref 0.50–1.35)
GFR calc Af Amer: 61 mL/min — ABNORMAL LOW (ref >90)
GFR calc non Af Amer: 52 mL/min — ABNORMAL LOW (ref >90)

## 2011-01-29 LAB — URINALYSIS, ROUTINE W REFLEX MICROSCOPIC
Bilirubin Urine: NEGATIVE
Nitrite: NEGATIVE
Specific Gravity, Urine: 1.02 (ref 1.005–1.030)
Urobilinogen, UA: 0.2 mg/dL (ref 0.0–1.0)

## 2011-01-29 LAB — DIFFERENTIAL
Eosinophils Absolute: 0.1 10*3/uL (ref 0.0–0.7)
Eosinophils Relative: 3 % (ref 0–5)
Lymphs Abs: 0.7 10*3/uL (ref 0.7–4.0)

## 2011-01-29 LAB — POCT I-STAT TROPONIN I: Troponin i, poc: 0.01 ng/mL (ref 0.00–0.08)

## 2011-01-29 LAB — CBC
MCHC: 33.5 g/dL (ref 30.0–36.0)
MCV: 85.9 fL (ref 78.0–100.0)
Platelets: 129 10*3/uL — ABNORMAL LOW (ref 150–400)
RDW: 15.3 % (ref 11.5–15.5)
WBC: 4.5 10*3/uL (ref 4.0–10.5)

## 2011-01-30 ENCOUNTER — Inpatient Hospital Stay (HOSPITAL_COMMUNITY): Payer: Medicare Other

## 2011-01-30 DIAGNOSIS — K2980 Duodenitis without bleeding: Secondary | ICD-10-CM

## 2011-01-30 HISTORY — DX: Duodenitis without bleeding: K29.80

## 2011-01-30 LAB — BASIC METABOLIC PANEL
CO2: 27 mEq/L (ref 19–32)
Chloride: 103 mEq/L (ref 96–112)
Glucose, Bld: 96 mg/dL (ref 70–99)
Potassium: 3.9 mEq/L (ref 3.5–5.1)
Sodium: 136 mEq/L (ref 135–145)

## 2011-01-30 LAB — GLUCOSE, CAPILLARY
Glucose-Capillary: 115 mg/dL — ABNORMAL HIGH (ref 70–99)
Glucose-Capillary: 105 mg/dL — ABNORMAL HIGH (ref 70–99)

## 2011-01-31 ENCOUNTER — Other Ambulatory Visit: Payer: Self-pay | Admitting: Gastroenterology

## 2011-01-31 LAB — BASIC METABOLIC PANEL
BUN: 8 mg/dL (ref 6–23)
Chloride: 105 mEq/L (ref 96–112)
GFR calc Af Amer: >90 mL/min (ref >90)
GFR calc non Af Amer: 80 mL/min — ABNORMAL LOW (ref >90)
Glucose, Bld: 85 mg/dL (ref 70–99)
Potassium: 3.7 mEq/L (ref 3.5–5.1)
Sodium: 138 mEq/L (ref 135–145)

## 2011-01-31 LAB — GLUCOSE, CAPILLARY
Glucose-Capillary: 98 mg/dL (ref 70–99)
Glucose-Capillary: 100 mg/dL — ABNORMAL HIGH (ref 70–99)

## 2011-02-01 LAB — GLUCOSE, CAPILLARY
Glucose-Capillary: 96 mg/dL (ref 70–99)
Glucose-Capillary: 123 mg/dL — ABNORMAL HIGH (ref 70–99)
Glucose-Capillary: 112 mg/dL — ABNORMAL HIGH (ref 70–99)

## 2011-02-02 LAB — BASIC METABOLIC PANEL
BUN: 6 mg/dL (ref 6–23)
CO2: 25 mEq/L (ref 19–32)
Calcium: 8.3 mg/dL — ABNORMAL LOW (ref 8.4–10.5)
Chloride: 101 mEq/L (ref 96–112)
Creatinine, Ser: 0.84 mg/dL (ref 0.50–1.35)
Glucose, Bld: 93 mg/dL (ref 70–99)

## 2011-02-02 LAB — GLUCOSE, CAPILLARY
Glucose-Capillary: 107 mg/dL — ABNORMAL HIGH (ref 70–99)
Glucose-Capillary: 116 mg/dL — ABNORMAL HIGH (ref 70–99)
Glucose-Capillary: 109 mg/dL — ABNORMAL HIGH (ref 70–99)
Glucose-Capillary: 123 mg/dL — ABNORMAL HIGH (ref 70–99)

## 2011-02-03 LAB — BASIC METABOLIC PANEL
Calcium: 8.7 mg/dL (ref 8.4–10.5)
GFR calc Af Amer: >90 mL/min (ref >90)
GFR calc non Af Amer: 80 mL/min — ABNORMAL LOW (ref >90)
Glucose, Bld: 103 mg/dL — ABNORMAL HIGH (ref 70–99)
Potassium: 3.8 mEq/L (ref 3.5–5.1)
Sodium: 136 mEq/L (ref 135–145)

## 2011-02-05 NOTE — H&P (Signed)
[name], STIPP NO.:  [id number]  MEDICAL RECORD NO.:  [id number]  LOCATION:  1303                         FACILITY:  [facility]  PHYSICIAN:  Della Goo, M.D. DATE OF BIRTH:  Jul 23, 1930  DATE OF ADMISSION:  01/28/2011 DATE OF DISCHARGE:                             HISTORY & PHYSICAL   PRIMARY CARE PHYSICIAN:  Antony Madura, MD.  ONCOLOGIST:  Lajuana Matte, MD  CHIEF COMPLAINT:  Weakness.  HISTORY OF PRESENT ILLNESS:  This is a 77 year old male with a history of small cell lung cancer whose last chemotherapy treatments had been months ago per his report.  He presents to the emergency department with complaints of severe weakness and decreased p.o. intake of foods and liquids secondary to chronic nausea.  He states he has had nausea for months.  He denies having any vomiting.  Denies having any diarrhea. Denies having any chest pain or shortness of breath.  PAST MEDICAL HISTORY:  Significant for: 1. Small cell lung cancer. 2. Coronary artery disease. 3. Hypertension. 4. COPD. 5. Atrial fibrillation. 6. Asthma. 7. Previous history of GI bleeding. 8. Cholelithiasis. 9. Type 2 diabetes mellitus. 10.Benign prostatic hypertrophy. 11.Vitamin D deficiency.  PAST SURGICAL HISTORY:  History of a coronary artery bypass grafting surgery and a pacemaker placement.  MEDICATIONS:  Advair Diskus, aspirin, Plavix, Imdur, lisinopril/HCTZ, metoprolol tartrate, multivitamins, nitroglycerin p.r.n., omeprazole, Reglan, simvastatin, and Tylenol.  ALLERGIES:  No known drug allergies.  SOCIAL HISTORY:  The patient is a nonsmoker and nondrinker.  No history of illicit drug usage.  FAMILY HISTORY:  Noncontributory.  REVIEW OF SYSTEMS:  Pertinent as mentioned above.  PHYSICAL EXAMINATION FINDINGS:  GENERAL:  This is a 77 year old elderly Caucasian male who is well-developed and in no acute distress currently. VITAL SIGNS:  Temperature 98.3, blood  pressure is 95/50 to 93/53, heart rate 90, respirations 24, and O2 saturations 98% to 100%. HEENT:  Normocephalic and atraumatic.  Pupils equally round, reactive to light.  Extraocular movements are intact.  Funduscopic benign.  There is no scleral icterus.  Nares are patent bilaterally.  Oropharynx is clear. NECK:  Supple.  Full range of motion.  No thyromegaly, adenopathy, or jugular venous distention. CARDIOVASCULAR:  Regular rate and rhythm.  No murmurs, gallops, or rubs appreciated. LUNGS:  Clear to auscultation bilaterally.  No rales, rhonchi, or wheezes. ABDOMEN:  Positive bowel sounds.  Soft, nontender, and nondistended.  No hepatosplenomegaly.  No rebound, no guarding. EXTREMITIES:  Without cyanosis, clubbing, or edema. NEUROLOGIC:  Generalized weakness, but otherwise, there are no focal deficits.  LABORATORY STUDIES:  White blood cell count 6.0, hemoglobin 11.0, hematocrit 33.8, MCV 87.8, platelets 159, neutrophils 76%, and lymphocytes 12%.  Sodium 132, potassium 4.0, chloride 97, carbon dioxide 26, BUN 34, creatinine 1.69, and glucose of 102.  Urinalysis negative. Troponin was 0.01.  Chest x-ray reveals no acute disease processes.  ASSESSMENT:  A 77 year old male being admitted with: 1. Hypotension. 2. Generalized weakness secondary to #1. 3. Chronic nausea. 4. Failure to thrive syndrome. 5. Small cell lung cancer. 6. Acute renal failure. 7. Anemia of chronic disease.  PLAN:  The patient will be admitted.  He will be placed on  IV fluids for fluid resuscitation.  Antiemetic therapy has been ordered as needed as well.  His blood pressure medications will be placed on hold for now. The rounding MD will resume his blood pressure medications once his blood pressure has improved and stabilized.  His regular medications will be further reconciled.  DVT prophylaxis with SCDs have been ordered.  The patient's code status is a full code at this time. Further workup will ensue  pending results of the patient's clinical course.     Della Goo, M.D.     HJ/MEDQ  D:  01/29/2011  T:  01/29/2011  Job:  [id number]  cc:   Antony Madura, M.D. Fax: [phone]  Lajuana Matte, M.D.  Electronically Signed by Della Goo M.D. on 02/05/2011 07:18:51 PM

## 2011-02-09 NOTE — Consult Note (Signed)
NAMEHERVEY, [name]                ACCOUNT NO.:  [id number]  MEDICAL RECORD NO.:  [id number]  LOCATION:  1303                         FACILITY:  [facility]  PHYSICIAN:  Shirley Friar, MDDATE OF BIRTH:  21-Dec-1930  DATE OF CONSULTATION: DATE OF DISCHARGE:                                CONSULTATION   REASON FOR CONSULT:  Chronic nausea, failure to thrive.  HISTORY OF PRESENT ILLNESS:  [name] is a 77 year old white male who was seen by me last week for chronic nausea in the setting of chemoradiation therapy.  He finished chemo and chest radiation in June 2012 and completed prophylactic cranial radiation 2 months ago.  He has been having persistent nausea without vomiting, despite trials of various antiemetics.  He has had a poor appetite.  He denies any associated abdominal pain, odynophagia, or dysphagia.  He has lost 17 pounds unintentionally since April when his chemotherapy was started for colon cancer.  Of note, he had a GI bleed in June and had an upper endoscopy at that time, which showed mild radiation esophagitis and a colonoscopy which showed pandiverticulosis concerning for diverticular source for the bleeding.  The Plavix and aspirin that he was on was stopped during the GI bleeding episode and the Plavix was restarted due to presence of a coronary stent put in January 2012.  Chest CT in July 2012 showed no evidence for mets.  He was scheduled to have an outpatient upper endoscopy this week, but over the weekend, his wife reports that he refused to take in any food or liquid at all and she called the doctor on call, did not know what to do and was told to come to the emergency room.  She says his blood pressure was low in the 60s systolic.  On presentation is blood pressure is in the 90s systolic over 50s diastolic.  His blood pressure in the office was 100/64.  He reports he denies any vomiting and denies any abdominal pain.  He has no appetite due to chronic  nausea.  He has been given Zofran in the hospital and despite Zofran since admission overnight, he has continued to have nausea.  PAST MEDICAL HISTORY: 1. Small cell lung cancer status post chemoradiation and prophylactic     cranial radiation. 2. Coronary artery disease status post coronary stent. 3. Hypertension. 4. COPD. 5. Atrial fibrillation. 6. Asthma. 7. History of GI bleeding as stated above. 8. Cholelithiasis. 9. Type 2 diabetes. 10.Benign prostatic hypertrophy. 11.Status post coronary artery bypass graft. 12.Status post pacemaker placement.  CURRENT MEDICINES:  Insulin, Biotene, Zofran, p.o. scheduled and IV p.r.n.  ALLERGIES:  No known drug allergies.  FAMILY HISTORY:  Noncontributory.  SOCIAL HISTORY:  Former smoker, denies alcohol or drugs.  REVIEW OF SYSTEMS:  Negative from GI standpoint except as stated above.  PHYSICAL EXAMINATION:  VITAL SIGNS: Temperature 99.5, pulse 69, blood pressure 118/71. GENERAL:  Elderly frail, awake, no acute distress. ABDOMEN:  Soft, nontender, nondistended.  Active bowel sounds. LABORATORIES:  White blood count 4.5, hemoglobin 10.4, platelet count 129, BUN 14 creatinine 0.97.  Other labs listed in hospital record and reviewed.  IMPRESSION:  A 77 year old white male with chronic  nausea in the setting of lung cancer status post chemoradiation and prophylactic cranial radiation.  He needs an upper endoscopy to look for gastric source, but I doubt he has a gastric outlet obstruction.  I think his nausea is due to his prophylactic brain radiation possibly causing a CNS cause of his nausea.   Would do an upper endoscopy to rule out any gastric or esophageal source and if this is unremarkable then we will defer to Oncology for further management and treatment of his chronic nausea.  Agree with the Zofran.     Shirley Friar, MD     VCS/MEDQ  D:  01/30/2011  T:  01/30/2011  Job:  [id number]  cc:   Lajuana Matte,  M.D.  Nanetta Batty, M.D. Fax: [phone]  Electronically Signed by Charlott Rakes MD on 02/09/2011 10:50:41 AM

## 2011-02-09 NOTE — Discharge Summary (Signed)
[name], [name] NO.:  [id number]  MEDICAL RECORD NO.:  [id number]  LOCATION:  1303                         FACILITY:  [facility]  PHYSICIAN:  Ramiro Harvest, MD    DATE OF BIRTH:  26-Feb-1931  DATE OF ADMISSION:  01/28/2011 DATE OF DISCHARGE:  02/03/2011                        DISCHARGE SUMMARY    PRIMARY CARE PHYSICIAN:  Antony Madura, M.D.  GASTROENTEROLOGIST:  Shirley Friar, MD of [facility]  ONCOLOGIST:  Lajuana Matte, M.D.  DISCHARGE DIAGNOSES: 1. Benign duodenal stenosis. 2. Acute-on-chronic nausea secondary to benign duodenal stenosis,     resolved/improved. 3. Acute renal failure, resolved. 4. Hypotension, resolved. 5. History of lung cancer. 6. History of radiation esophagitis. 7. History of small cell lung cancer status post chemoradiation and     prophylactic cranial radiation. 8. Coronary artery disease, status post coronary stent. 9. Hypertension. 10.History of chronic obstructive pulmonary disease. 11.History of atrial fibrillation. 12.Asthma. 13.History of gastrointestinal bleed in the past. 14.History of cholelithiasis. 15.Type 2 diabetes. 16.Benign prostatic hypertrophy. 17.Status post coronary artery bypass graft. 18.Status post permanent pacemaker placement. 19.Vitamin D deficiency.  DISCHARGE MEDICATIONS: 1. Tylenol 650 mg p.o. q.4 hours p.r.n. 2. Glucerna 237 mL p.o. b.i.d. 3. Oxycodone 5 mg p.o. q.4 hours p.r.n. 4. Protonix 40 mg p.o. daily. 5. Advair Diskus 100/50 one puff twice daily. 6. Albuterol inhaler 2 puffs every 4 hours as needed. 7. Aspirin enteric-coated 325 mg p.o. daily. 8. Imdur 30 mg p.o. q.a.m. 9. Lisinopril/HCTZ 10/12.5 mg p.o. daily to resume in 3-4 days. 10.Metoprolol 25 mg p.o. b.i.d. 11.Nitroglycerin 0.4 mg sublingual q.5 minutes x3 p.r.n. 12.Plavix 75 mg p.o. daily.  Patient is to hold his Plavix on January 29, 2011. 13.Simvastatin 20 mg p.o. daily. 14.Spiriva 18 mcg inhalation  daily. 15.Zofran 8 mg p.o. t.i.d. p.r.n.  DISPOSITION AND FOLLOWUP:  Patient will be discharged home.  Patient is to follow up with Dr. Bosie Clos of [facility] in about 3-4 weeks.  Patient is also to follow up with Dr. Si Gaul of Oncology on February 22, 2011 at which time patient will need to be reassessed.  He will need a CBC done as well as a BMET.  Patient is also to follow up with PCP 1 week post-discharge.  Patient's ACE inhibitor/diuretic was held during this hospitalization secondary to hypotension.  Patient will be resumed on this 3-4 days post-discharge. He will need to follow up with PCP to reassess his blood pressure and other chronic medical issues.  CONSULTATIONS DONE:  GI consultation was done.  Patient was seen in consultation by Dr. Charlott Rakes of [facility] on January 30, 2011.  PROCEDURES PERFORMED: 1. Chest x-ray was done, January 28, 2011 that showed no acute changes     seen from prior recent CT.  Left upper lobe and paramediastinal     radiation changes again noted with stable appearance to associated     spiculation, stability is difficult to fully assess on radiograph,     findings of COPD. 2. Acute abdominal series done, January 30, 2011, shows no acute     findings. 3. An upper endoscopy was done on January 31, 2011 by  Dr. Charlott Rakes that showed duodenal stenosis status post biopsies, small     hiatal hernia.  BRIEF ADMISSION HISTORY AND PHYSICAL:  [name] is a 77 year old Caucasian gentleman with history of small cell lung cancer whose last chemo treatments had been months ago.  Patient presented to the ED with complaints of severe weakness and decreased oral intake of food and liquids secondary to his chronic nausea. Patient stated that he had nausea for months, denied having any vomiting, denied having any diarrhea, denied having any chest pain or shortness of breath.  For the rest of admission history and  physical, please see H and P that was dictated by Dr. Lovell Sheehan of job [phone].  HOSPITAL COURSE: 1. Benign duodenal stenosis.  Patient was admitted with acute-on-     chronic nausea.  Patient was placed on a scheduled Zofran with     antiemetics, also placed on IV fluids and supportive care and     monitored and followed.  Patient was made NPO.  Patient was     monitored during the hospitalization, however, patient did not have     any significant improvement.  Abdominal x-rays, which were done     were unremarkable.  A GI consultation was subsequently obtained.     Patient was seen in consultation by Dr. Charlott Rakes on January 30, 2011 and at that time, it was felt that the patient will     benefit from an upper endoscopy to rule out any gastric sources for     his nausea and vomiting.  Patient subsequently underwent an upper     EGD with results as stated above, which did show a duodenal     stenosis.  Biopsies were taken, also showed a small hiatal hernia.     Pathology for those biopsies, which were taken did show chronic     duodenitis consistent with peptic duodenitis, no villous atrophy,     no H. pylori, no malignancy was identified.  Patient was maintained     on scheduled Zofran.  His diet was slowly advanced.  He was placed     on a clear liquid diet and also placed on a proton pump inhibitor.     Patient improved slowly and clinically during the hospitalization.     His diet was further advanced to a solid meals/heart-healthy diet,     which the patient tolerated.  Patient's nausea improved     significantly, did not have any emesis, and did not have any     further nausea by day of discharge.  Patient was seen in     consultation by GI and followed throughout the hospitalization.     Patient will follow up as an outpatient with Dr. Bosie Clos secondary     to his duodenal stenosis, which was benign.  He will also follow up     with his oncologist on February 22, 2011  to include his PCP 1 week     post-discharge.  Patient will be discharged in stable and improved     condition. 2. Acute-on-chronic nausea secondary to benign duodenal stenosis. 3. Acute renal failure.  On admission, patient was noted to be     hypotensive.  Initial lab work obtained showed that patient was in     acute renal failure.  His creatinine was up to 1.69 with a BUN of     34.  It was felt this was  likely secondary to a prerenal azotemia.     Patient was hydrated with IV fluids with resolution of his acute     renal failure.  By day of discharge, patient's creatinine was down     to 0.91.  His ACE inhibitor and diuretics were held on admission.     As his renal function improved, it has been recommended that the     patient may resume this about 3-4 days post-discharge.  He will     need close followup with his PCP to assess his blood pressure and     his renal function.  Patient will be discharged in a stable and     improved condition. 4. Hypotension on admission.  Patient was noted to be hypotensive with     systolic blood pressures in the 90s. It was felt this was secondary     to dehydration and volume issues due to decreased oral intake.     Initial set of troponins, which were drawn were negative x2.     Patient did not have any chest pain.  Patient was hydrated with IV     fluids with clinical improvement and improvement in his blood     pressure.  Patient was maintained on his metoprolol.  His Imdur was     slowly resumed, but his ACE inhibitor and diuretic were held.     Patient's blood pressure improved clinically such that by day of     discharge, his blood pressure was 118/69.  Patient will be     discharged in stable and improved condition with close followup     with his PCP as stated above.  The rest of the patient's chronic     medical issues remained stable throughout the hospitalization and     patient will be discharged in a stable and improved  condition.  DISCHARGE VITAL SIGNS:  On the day of discharge, vital signs; temperature 97.9, pulse of 76, respirations 16, blood pressure 118/69, satting 96% on room air.  DISCHARGE LABS:  Sodium 136, potassium 3.8, chloride 103, bicarb 24, glucose 103, BUN 7, creatinine 0.91, calcium of 8.7.  It has been a pleasure taking care of [name].     Ramiro Harvest, MD     DT/MEDQ  D:  02/03/2011  T:  02/03/2011  Job:  [id number]  cc:   Antony Madura, M.D. Fax: [phone]  Shirley Friar, MD Fax: [phone]  Lajuana Matte, M.D.  Electronically Signed by Ramiro Harvest MD on 02/09/2011 10:31:53 AM

## 2011-02-16 ENCOUNTER — Ambulatory Visit
Admission: RE | Admit: 2011-02-16 | Discharge: 2011-02-16 | Disposition: A | Discharge status: Home / Self Care | Payer: Medicare Other | Source: Ambulatory Visit | Attending: Internal Medicine | Admitting: Internal Medicine

## 2011-02-16 ENCOUNTER — Other Ambulatory Visit: Payer: Self-pay | Admitting: Internal Medicine

## 2011-02-16 DIAGNOSIS — M25559 Pain in unspecified hip: Secondary | ICD-10-CM

## 2011-02-16 DIAGNOSIS — M549 Dorsalgia, unspecified: Secondary | ICD-10-CM

## 2011-02-18 ENCOUNTER — Encounter: Payer: Self-pay | Admitting: *Deleted

## 2011-02-21 ENCOUNTER — Other Ambulatory Visit: Payer: Self-pay | Admitting: Internal Medicine

## 2011-02-21 DIAGNOSIS — C3490 Malignant neoplasm of unspecified part of unspecified bronchus or lung: Secondary | ICD-10-CM

## 2011-02-22 ENCOUNTER — Other Ambulatory Visit: Payer: Self-pay | Admitting: Internal Medicine

## 2011-02-22 ENCOUNTER — Other Ambulatory Visit: Payer: Self-pay | Admitting: *Deleted

## 2011-02-22 ENCOUNTER — Other Ambulatory Visit (HOSPITAL_BASED_OUTPATIENT_CLINIC_OR_DEPARTMENT_OTHER): Payer: Medicare Other | Admitting: Lab

## 2011-02-22 ENCOUNTER — Telehealth: Payer: Self-pay | Admitting: Internal Medicine

## 2011-02-22 ENCOUNTER — Ambulatory Visit: Payer: Medicare Other

## 2011-02-22 ENCOUNTER — Ambulatory Visit (HOSPITAL_BASED_OUTPATIENT_CLINIC_OR_DEPARTMENT_OTHER): Payer: Medicare Other | Admitting: Physician Assistant

## 2011-02-22 VITALS — BP 139/72 | HR 80 | Temp 96.7°F | Ht 67.0 in | Wt 147.5 lb

## 2011-02-22 VITALS — BP 132/69 | HR 74 | Temp 96.8°F

## 2011-02-22 DIAGNOSIS — C3490 Malignant neoplasm of unspecified part of unspecified bronchus or lung: Secondary | ICD-10-CM

## 2011-02-22 DIAGNOSIS — C3410 Malignant neoplasm of upper lobe, unspecified bronchus or lung: Secondary | ICD-10-CM

## 2011-02-22 DIAGNOSIS — R0602 Shortness of breath: Secondary | ICD-10-CM

## 2011-02-22 DIAGNOSIS — M25559 Pain in unspecified hip: Secondary | ICD-10-CM

## 2011-02-22 LAB — CBC WITH DIFFERENTIAL/PLATELET
BASO%: 0.4 % (ref 0.0–2.0)
LYMPH%: 13.4 % — ABNORMAL LOW (ref 14.0–49.0)
MCHC: 33.7 g/dL (ref 32.0–36.0)
MONO#: 0.5 10*3/uL (ref 0.1–0.9)
MONO%: 9.4 % (ref 0.0–14.0)
NEUT#: 3.8 10*3/uL (ref 1.5–6.5)
Platelets: 128 10*3/uL — ABNORMAL LOW (ref 140–400)
RBC: 4.13 10*6/uL — ABNORMAL LOW (ref 4.20–5.82)
RDW: 15.8 % — ABNORMAL HIGH (ref 11.0–14.6)
WBC: 5.2 10*3/uL (ref 4.0–10.3)
nRBC: 0 % (ref 0–0)

## 2011-02-22 LAB — COMPREHENSIVE METABOLIC PANEL
AST: 13 U/L (ref 0–37)
Albumin: 3.8 g/dL (ref 3.5–5.2)
BUN: 17 mg/dL (ref 6–23)
CO2: 25 mEq/L (ref 19–32)
Calcium: 9.1 mg/dL (ref 8.4–10.5)
Chloride: 99 mEq/L (ref 96–112)
Creatinine, Ser: 0.89 mg/dL (ref 0.50–1.35)
Glucose, Bld: 97 mg/dL (ref 70–99)

## 2011-02-22 MED ORDER — SODIUM CHLORIDE 0.9 % IJ SOLN
10.0000 mL | INTRAMUSCULAR | Status: DC | PRN
  Administered 2011-02-22: 10 mL via INTRAVENOUS
  Filled 2011-02-22: qty 10

## 2011-02-22 MED ORDER — ALTEPLASE 2 MG IJ SOLR
2.0000 mg | Freq: Once | INTRAMUSCULAR | Status: DC | PRN
  Filled 2011-02-22: qty 2

## 2011-02-22 MED ORDER — HEPARIN SOD (PORK) LOCK FLUSH 100 UNIT/ML IV SOLN
500.0000 [IU] | Freq: Once | INTRAVENOUS | Status: AC | PRN
  Administered 2011-02-22: 500 [IU] via INTRAVENOUS
  Filled 2011-02-22: qty 5

## 2011-02-22 NOTE — Telephone Encounter (Signed)
gve the pt's wife the jan 2013 appt calendar along with the ct scan appt

## 2011-02-22 NOTE — Patient Instructions (Signed)
Call MD for problems 

## 2011-02-22 NOTE — Progress Notes (Signed)
No images are attached to the encounter. No scans are attached to the encounter. No scans are attached to the encounter. [facility] OFFICE PROGRESS NOTE  ROBERTS, [name], MD, MD [address] [place] [address]  DIAGNOSIS: Limited stage small cell lung cancer diagnosed in April of 2012  PRIOR THERAPY: 1. Status post systemic chemotherapy with carboplatin and etoposide, last dose was given 09/30/2010. This was concurrent with radiotherapy under the care of Dr. Champ Mungo. 2. Status post prophylactic cranial irradiation completed on August 21,2012.   CURRENT THERAPY: Observation  INTERVAL HISTORY: [name] 77 y.o. male returns for scheduled  hospital follow-up visit, accompanied by his wife. Since being discharged from the hospital, he has stopped taking Zofran because he no longer is troubled by nausea. His bowels are moving regularly and he has not noticed any blood in his urine or stool. He continues to have shortness of breath with exertion. He complains of left hip and leg pain. He was recently seen by his primary care physician for these complaint. X-rays were done and revealed "arthritis". He was placed on Robaxin and takes a half tablet by mouth twice daily.Marland Kitchen He uses oxygen at 3L/minute when he is exerting himself. He does not currently use the oxygen at rest or at night. His wife states that his PSA followed by Dr. Su Hilt has decreased recently from 15 down to 11.4.  MEDICAL HISTORY: Past Medical History  Diagnosis Date  . Hypertension   . Cancer     small cell lung cancer  . Heart attack   . Emphysema   . Hyperlipidemia   . Allergic rhinitis   . Heart failure   . Cholelithiasis     ALLERGIES:   has no known allergies.  MEDICATIONS:  Current Outpatient Prescriptions  Medication Sig Dispense Refill  . aspirin EC 325 MG tablet Take 325 mg by mouth daily.       . feeding supplement (GLUCERNA SHAKE) LIQD Take 237 mLs by mouth 2 (two)  times daily before a meal.        . oxycodone (OXY-IR) 5 MG capsule Take 5 mg by mouth every 4 (four) hours as needed.        Marland Kitchen acetaminophen (TYLENOL) 325 MG tablet Take 650 mg by mouth every 6 (six) hours as needed.        Marland Kitchen ADVAIR DISKUS 100-50 MCG/DOSE AEPB Take 1 puff by mouth daily. And an additional if needed      . albuterol (VENTOLIN HFA) 108 (90 BASE) MCG/ACT inhaler Inhale 2 puffs into the lungs every 4 (four) hours as needed for wheezing.  1 Inhaler  3  . aspirin 81 MG tablet Take 81 mg by mouth daily.       . clopidogrel (PLAVIX) 75 MG tablet Take 1 tablet by mouth daily.      . isosorbide mononitrate (IMDUR) 30 MG 24 hr tablet Take 1 tablet by mouth daily.      Marland Kitchen lisinopril-hydrochlorothiazide (PRINZIDE,ZESTORETIC) 10-12.5 MG per tablet Take 1 tablet by mouth daily.        . methocarbamol (ROBAXIN) 750 MG tablet       . metoCLOPramide (REGLAN) 10 MG tablet Take 1 tablet by mouth Twice daily.      . metoprolol tartrate (LOPRESSOR) 25 MG tablet Take 25 mg by mouth 2 (two) times daily.        . Multiple Vitamin (MULTIVITAMIN) tablet Take 1 tablet by mouth daily.        Marland Kitchen  nitroGLYCERIN (NITROSTAT) 0.4 MG SL tablet Place 0.4 mg under the tongue every 5 (five) minutes as needed.        Marland Kitchen omeprazole (PRILOSEC) 40 MG capsule Take 40 mg by mouth daily.        . pantoprazole (PROTONIX) 40 MG tablet       . prochlorperazine (COMPAZINE) 10 MG tablet as needed.      . simvastatin (ZOCOR) 20 MG tablet Take 20 mg by mouth at bedtime.        Marland Kitchen tiotropium (SPIRIVA HANDIHALER) 18 MCG inhalation capsule Place 1 capsule (18 mcg total) into inhaler and inhale daily.  30 capsule  3   No current facility-administered medications for this visit.   Facility-Administered Medications Ordered in Other Visits  Medication Dose Route Frequency Provider Last Rate Last Dose  . heparin lock flush 100 unit/mL  500 Units Intravenous Once PRN Mohamed K. Mohamed, MD   500 Units at 02/22/11 1558  . DISCONTD:  alteplase (CATHFLO ACTIVASE) injection 2 mg  2 mg Intracatheter Once PRN Mohamed K. Mohamed, MD      . DISCONTD: sodium chloride 0.9 % injection 10 mL  10 mL Intravenous PRN Mohamed K. [name], MD   10 mL at 02/22/11 1557    SURGICAL HISTORY:  Past Surgical History  Procedure Date  . Coronary artery bypass graft   . Pacemaker insertion   . Portacath placement4/18/12 tip svc     REVIEW OF SYSTEMS:  A comprehensive review of systems was negative except for: Respiratory: positive for dyspnea on exertion Musculoskeletal: positive for arthralgias and bone pain   PHYSICAL EXAMINATION: General appearance: alert, cooperative and no distress Head: Normocephalic, without obvious abnormality, atraumatic Neck: no adenopathy, no carotid bruit, no JVD, supple, symmetrical, trachea midline and thyroid not enlarged, symmetric, no tenderness/mass/nodules Lymph nodes: Cervical, supraclavicular, and axillary nodes normal. Resp: clear to auscultation bilaterally Cardio: regular rate and rhythm, S1, S2 normal, no murmur, click, rub or gallop GI: soft, non-tender; bowel sounds normal; no masses,  no organomegaly Extremities: extremities normal, atraumatic, no cyanosis or edema  ECOG PERFORMANCE STATUS: 1 - Symptomatic but completely ambulatory  Blood pressure 139/72, pulse 80, temperature 96.7 F (35.9 C), height 5\' 7"  (1.702 m), weight 147 lb 8 oz (66.906 kg).  LABORATORY DATA: Lab Results  Component Value Date   WBC 5.2 02/22/2011   HGB 11.9* 02/22/2011   HCT 35.3* 02/22/2011   MCV 85.5 02/22/2011   PLT 128* 02/22/2011      Chemistry      Component Value Date/Time   NA 137 02/22/2011 1411   NA 142 01/12/2011 1102   K 3.4* 02/22/2011 1411   K 4.6 01/12/2011 1102   CL 99 02/22/2011 1411   CL 104 01/12/2011 1102   CO2 25 02/22/2011 1411   CO2 27 01/12/2011 1102   BUN 17 02/22/2011 1411   BUN 14 01/12/2011 1102   CREATININE 0.89 02/22/2011 1411   CREATININE 0.8 01/12/2011 1102      Component  Value Date/Time   CALCIUM 9.1 02/22/2011 1411   CALCIUM 8.9 01/12/2011 1102   CALCIUM 7.6* 09/18/2010 1136   ALKPHOS 70 02/22/2011 1411   ALKPHOS 70 01/12/2011 1102   AST 13 02/22/2011 1411   AST 20 01/12/2011 1102   ALT 9 02/22/2011 1411   BILITOT 0.5 02/22/2011 1411   BILITOT 0.70 01/12/2011 1102       RADIOGRAPHIC STUDIES:  No results found.  ASSESSMENT/PLAN:  This is a very pleasant 77 year old white  male with a history of limited stage small cell lung cancer, treated to date as described above. Since his hospital discharge, he has returned to his baseline performance status and remains stable. He is due for a Port-A-Cath flush today and will proceed with that today as scheduled The patient was discussed with Dr. [name]. He will follo-up with Dr. [name] in 2 months with a repeat CBC-diff, CMET, and CT of the chest with contrast and a CT of the head with and with out contrast to reevaluate his disease.He will be due for another Port-A-Cath flush when he returns in January 2013 as well.     Conni Slipper, PA-C     All questions were answered. The patient knows to call the clinic with any problems, questions or concerns. We can certainly see the patient much sooner if necessary.

## 2011-03-11 HISTORY — PX: CAROTID ENDARTERECTOMY: SUR193

## 2011-03-29 ENCOUNTER — Encounter: Payer: Self-pay | Admitting: Emergency Medicine

## 2011-03-29 ENCOUNTER — Ambulatory Visit (INDEPENDENT_AMBULATORY_CARE_PROVIDER_SITE_OTHER): Payer: Medicare Other | Admitting: Emergency Medicine

## 2011-03-29 DIAGNOSIS — C3490 Malignant neoplasm of unspecified part of unspecified bronchus or lung: Secondary | ICD-10-CM

## 2011-03-29 DIAGNOSIS — J449 Chronic obstructive pulmonary disease, unspecified: Secondary | ICD-10-CM

## 2011-03-29 NOTE — Assessment & Plan Note (Signed)
For repeat Ct scans head and chest in January, then f/u Drs [name] and [name]

## 2011-03-29 NOTE — Patient Instructions (Signed)
Please continue your Advair and Spiriva Use albuterol if needed Wear your oxygen with exertion  Get your CT scans of the chest and head in January as planned Follow with Dr [name] in 6 months or sooner if you have any problems

## 2011-03-29 NOTE — Progress Notes (Signed)
  Subjective:    Patient ID: [name], male    DOB: 10-09-1930, 77 y.o.   MRN: [id number]  HPI 77 yo man, former tobacco 100 pk-yr, CAD s/p CABG and PTCI, hx small cell lung CA in 3/12 s/p chemo and XRT last in 6/12, just completed whole brain radiation. F/u imaging with improvement in the small cell CA, to have repeat CT in October. He presents today with fatigue, exertional dyspnea. Had been on Advair prior to the lung CA. He does not have SABA at this time. He was started on O2, doesn't wear reliably, needs it w exertion.   ROV 12/30/10 -- follow up, hx CAD, tobacco w COPD, small cell lung CA s/p chemo and XRT.  Last time we added Spiriva to Advair - he believes that his breathing is much better. He has not been wearing his his O2 as reliably. He had PFT in April '12. He has some congestion/URI type symptoms. Improved. Still with some UA irritation.   ROV 03/29/11 -- follow up, hx CAD, tobacco w COPD, small cell lung CA s/p chemo and XRT.  He has finished brain XRT. Was admitted to [facility] with severe nausea, GERD, wt loss.  He is doing much better now - planning for CT chest and brain 04/13/10 [name] and [name]). He is tolerating Advair and Spiriva. Hoarse voice is better now that GERD is treated. He has not needed SABA.    Objective:   Physical Exam  Gen: Pleasant, well-nourished, in no distress,  normal affect  ENT: No lesions,  mouth clear,  oropharynx clear, no postnasal drip, some hoarse voice  Neck: No JVD, no TMG, no carotid bruits  Lungs: No use of accessory muscles, very distant, no wheeze  Cardiovascular: RRR, heart sounds normal, no murmur or gallops, no peripheral edema  Musculoskeletal: No deformities, no cyanosis or clubbing  Neuro: alert, non focal  Skin: Warm, no lesions or rashes     Assessment & Plan:  COPD (chronic obstructive pulmonary disease) Please continue your Advair and Spiriva Use albuterol if needed Wear your oxygen with exertion  Follow with [name] in 6 months or sooner if you have any problems  Small cell lung cancer For repeat Ct scans head and chest in January, then f/u Drs [name] and [name]

## 2011-03-29 NOTE — Assessment & Plan Note (Signed)
Please continue your Advair and Spiriva Use albuterol if needed Wear your oxygen with exertion  Follow with Dr [name] in 6 months or sooner if you have any problems

## 2011-03-30 ENCOUNTER — Telehealth: Payer: Self-pay | Admitting: Emergency Medicine

## 2011-03-30 NOTE — Telephone Encounter (Signed)
Pt brought a letter from [facility] requesting that pt requalify for oxygen within 90 days of requalification in March 2013. Pt was in for OV on 03/29/11 w/ Rb and this was done. Sats from 03/29/11 Ov  faxed to [place] at [phone].

## 2011-04-13 ENCOUNTER — Ambulatory Visit (HOSPITAL_BASED_OUTPATIENT_CLINIC_OR_DEPARTMENT_OTHER): Payer: Medicare Other

## 2011-04-13 ENCOUNTER — Ambulatory Visit (HOSPITAL_COMMUNITY)
Admission: RE | Admit: 2011-04-13 | Discharge: 2011-04-13 | Disposition: A | Discharge status: Home / Self Care | Payer: Medicare Other | Source: Ambulatory Visit | Attending: Physician Assistant | Admitting: Physician Assistant

## 2011-04-13 ENCOUNTER — Other Ambulatory Visit (HOSPITAL_BASED_OUTPATIENT_CLINIC_OR_DEPARTMENT_OTHER): Payer: Medicare Other | Admitting: Lab

## 2011-04-13 ENCOUNTER — Other Ambulatory Visit: Payer: Self-pay | Admitting: Physician Assistant

## 2011-04-13 VITALS — BP 132/65 | HR 80 | Temp 97.0°F

## 2011-04-13 DIAGNOSIS — G319 Degenerative disease of nervous system, unspecified: Secondary | ICD-10-CM | POA: Insufficient documentation

## 2011-04-13 DIAGNOSIS — R0602 Shortness of breath: Secondary | ICD-10-CM | POA: Insufficient documentation

## 2011-04-13 DIAGNOSIS — Z8673 Personal history of transient ischemic attack (TIA), and cerebral infarction without residual deficits: Secondary | ICD-10-CM | POA: Insufficient documentation

## 2011-04-13 DIAGNOSIS — J438 Other emphysema: Secondary | ICD-10-CM | POA: Insufficient documentation

## 2011-04-13 DIAGNOSIS — C3490 Malignant neoplasm of unspecified part of unspecified bronchus or lung: Secondary | ICD-10-CM

## 2011-04-13 DIAGNOSIS — N289 Disorder of kidney and ureter, unspecified: Secondary | ICD-10-CM | POA: Insufficient documentation

## 2011-04-13 DIAGNOSIS — C3410 Malignant neoplasm of upper lobe, unspecified bronchus or lung: Secondary | ICD-10-CM

## 2011-04-13 LAB — CBC WITH DIFFERENTIAL/PLATELET
Basophils Absolute: 0 10*3/uL (ref 0.0–0.1)
EOS%: 3.8 % (ref 0.0–7.0)
Eosinophils Absolute: 0.2 10*3/uL (ref 0.0–0.5)
HCT: 36.3 % — ABNORMAL LOW (ref 38.4–49.9)
HGB: 12.4 g/dL — ABNORMAL LOW (ref 13.0–17.1)
MCH: 30.1 pg (ref 27.2–33.4)
MCV: 88.1 fL (ref 79.3–98.0)
MONO%: 9.3 % (ref 0.0–14.0)
NEUT#: 4.5 10*3/uL (ref 1.5–6.5)
NEUT%: 74.8 % (ref 39.0–75.0)
Platelets: 131 10*3/uL — ABNORMAL LOW (ref 140–400)

## 2011-04-13 LAB — CMP (CANCER CENTER ONLY)
ALT(SGPT): 19 U/L (ref 10–47)
AST: 18 U/L (ref 11–38)
CO2: 29 mEq/L (ref 18–33)
Calcium: 8.8 mg/dL (ref 8.0–10.3)
Chloride: 104 mEq/L (ref 98–108)
Creat: 0.8 mg/dl (ref 0.6–1.2)
Potassium: 4 mEq/L (ref 3.3–4.7)
Sodium: 142 mEq/L (ref 128–145)
Total Protein: 6.9 g/dL (ref 6.4–8.1)

## 2011-04-13 MED ORDER — IOHEXOL 300 MG/ML  SOLN
100.0000 mL | Freq: Once | INTRAMUSCULAR | Status: AC | PRN
  Administered 2011-04-13: 100 mL via INTRAVENOUS

## 2011-04-13 MED ORDER — SODIUM CHLORIDE 0.9 % IJ SOLN
10.0000 mL | INTRAMUSCULAR | Status: DC | PRN
  Administered 2011-04-13: 10 mL via INTRAVENOUS
  Filled 2011-04-13: qty 10

## 2011-04-13 MED ORDER — HEPARIN SOD (PORK) LOCK FLUSH 100 UNIT/ML IV SOLN
500.0000 [IU] | Freq: Once | INTRAVENOUS | Status: AC | PRN
  Administered 2011-04-13: 500 [IU] via INTRAVENOUS
  Filled 2011-04-13: qty 5

## 2011-04-13 NOTE — Progress Notes (Signed)
Port accessed, labs drawn and port left accessed for CT scan

## 2011-04-13 NOTE — Patient Instructions (Signed)
Call MD for problems 

## 2011-04-19 ENCOUNTER — Telehealth: Payer: Self-pay | Admitting: Internal Medicine

## 2011-04-19 ENCOUNTER — Ambulatory Visit (HOSPITAL_BASED_OUTPATIENT_CLINIC_OR_DEPARTMENT_OTHER): Payer: Medicare Other | Admitting: Internal Medicine

## 2011-04-19 VITALS — BP 112/67 | HR 74 | Temp 96.7°F | Ht 68.0 in | Wt 149.0 lb

## 2011-04-19 DIAGNOSIS — C3490 Malignant neoplasm of unspecified part of unspecified bronchus or lung: Secondary | ICD-10-CM

## 2011-04-19 DIAGNOSIS — Z9221 Personal history of antineoplastic chemotherapy: Secondary | ICD-10-CM

## 2011-04-19 DIAGNOSIS — Z85118 Personal history of other malignant neoplasm of bronchus and lung: Secondary | ICD-10-CM

## 2011-04-19 DIAGNOSIS — Z923 Personal history of irradiation: Secondary | ICD-10-CM

## 2011-04-19 NOTE — Telephone Encounter (Signed)
gve the pt his April 2013 appt calendar along with the ct scan appt 

## 2011-04-19 NOTE — Progress Notes (Signed)
[facility] OFFICE PROGRESS NOTE  ROBERTS, Keith Ammons, MD, MD [address] [place] [address]  PRINCIPAL DIAGNOSIS:  Limited-stage small cell lung cancer diagnosed in April of 2012.  PRIOR THERAPY:   1. Status post systemic chemotherapy with carboplatin and etoposide, last dose was given 09/30/2010.  This was concurrent with radiotherapy under the care of Dr. [name]. 2. Status post prophylactic cranial irradiation completed on November 29, 2010.  CURRENT THERAPY:  Observation.  INTERVAL HISTORY: [name] 78 y.o. male returns to the clinic today for followup visit that time by several family members. The patient is feeling very well today with no specific complaints. He denied having any significant chest pain or shortness of breath, no cough or hemoptysis. Has no significant weight loss or night sweats. The patient has repeat CT scan of the head and chest performed recently and he is here today for evaluation and discussion of his scan results.  MEDICAL HISTORY: Past Medical History  Diagnosis Date  . Hypertension   . Cancer     small cell lung cancer  . Heart attack   . Emphysema   . Hyperlipidemia   . Allergic rhinitis   . Heart failure   . Cholelithiasis     ALLERGIES:   has no known allergies.  MEDICATIONS:  Current Outpatient Prescriptions  Medication Sig Dispense Refill  . ADVAIR DISKUS 100-50 MCG/DOSE AEPB Take 1 puff by mouth 2 (two) times daily.       Marland Kitchen aspirin EC 325 MG tablet Take 325 mg by mouth daily.       . clopidogrel (PLAVIX) 75 MG tablet Take 1 tablet by mouth daily.      . isosorbide mononitrate (IMDUR) 30 MG 24 hr tablet Take 1 tablet by mouth daily.      Marland Kitchen lisinopril-hydrochlorothiazide (PRINZIDE,ZESTORETIC) 10-12.5 MG per tablet Take 1 tablet by mouth daily.        . methocarbamol (ROBAXIN) 750 MG tablet Take 750 mg by mouth as needed.       . metoprolol tartrate (LOPRESSOR) 25 MG tablet Take 25 mg by mouth 2 (two)  times daily.        . simvastatin (ZOCOR) 20 MG tablet Take 20 mg by mouth at bedtime.        Marland Kitchen tiotropium (SPIRIVA HANDIHALER) 18 MCG inhalation capsule Place 1 capsule (18 mcg total) into inhaler and inhale daily.  30 capsule  3  . acetaminophen (TYLENOL) 325 MG tablet Take 650 mg by mouth every 6 (six) hours as needed.        Marland Kitchen albuterol (VENTOLIN HFA) 108 (90 BASE) MCG/ACT inhaler Inhale 2 puffs into the lungs every 4 (four) hours as needed for wheezing.  1 Inhaler  3  . feeding supplement (GLUCERNA SHAKE) LIQD Take 237 mLs by mouth 2 (two) times daily before a meal.        . Multiple Vitamin (MULTIVITAMIN) tablet Take 1 tablet by mouth daily.        . nitroGLYCERIN (NITROSTAT) 0.4 MG SL tablet Place 0.4 mg under the tongue every 5 (five) minutes as needed.        . pantoprazole (PROTONIX) 40 MG tablet Take 40 mg by mouth daily.         SURGICAL HISTORY:  Past Surgical History  Procedure Date  . Coronary artery bypass graft   . Pacemaker insertion   . Portacath placement4/18/12 tip svc     REVIEW OF SYSTEMS:  A comprehensive review of systems was negative.   PHYSICAL EXAMINATION: General appearance: alert, cooperative and no distress Head: Normocephalic, without obvious abnormality, atraumatic Neck: no adenopathy Lymph nodes: Cervical, supraclavicular, and axillary nodes normal. Resp: clear to auscultation bilaterally Cardio: regular rate and rhythm, S1, S2 normal, no murmur, click, rub or gallop GI: soft, non-tender; bowel sounds normal; no masses,  no organomegaly Extremities: extremities normal, atraumatic, no cyanosis or edema Neurologic: Alert and oriented X 3, normal strength and tone. Normal symmetric reflexes. Normal coordination and gait  ECOG PERFORMANCE STATUS: 0 - Asymptomatic  Blood pressure 112/67, pulse 74, temperature 96.7 F (35.9 C), temperature source Oral, height 5\' 8"  (1.727 m), weight 149 lb (67.586 kg).  LABORATORY DATA: Lab Results  Component Value  Date   WBC 6.0 04/13/2011   HGB 12.4* 04/13/2011   HCT 36.3* 04/13/2011   MCV 88.1 04/13/2011   PLT 131* 04/13/2011      Chemistry      Component Value Date/Time   NA 142 04/13/2011 1256   NA 137 02/22/2011 1411   K 4.0 04/13/2011 1256   K 3.4* 02/22/2011 1411   CL 104 04/13/2011 1256   CL 99 02/22/2011 1411   CO2 29 04/13/2011 1256   CO2 25 02/22/2011 1411   BUN 21 04/13/2011 1256   BUN 17 02/22/2011 1411   CREATININE 0.8 04/13/2011 1256   CREATININE 0.89 02/22/2011 1411      Component Value Date/Time   CALCIUM 8.8 04/13/2011 1256   CALCIUM 9.1 02/22/2011 1411   CALCIUM 7.6* 09/18/2010 1136   ALKPHOS 60 04/13/2011 1256   ALKPHOS 70 02/22/2011 1411   AST 18 04/13/2011 1256   AST 13 02/22/2011 1411   ALT 9 02/22/2011 1411   BILITOT 0.70 04/13/2011 1256   BILITOT 0.5 02/22/2011 1411       RADIOGRAPHIC STUDIES: Ct Head W Wo Contrast  04/13/2011  *RADIOLOGY REPORT*  Clinical Data: Restaging of small cell lung cancer.  CT HEAD WITHOUT AND WITH CONTRAST  Technique:  Contiguous axial images were obtained from the base of the skull through the vertex without and with intravenous contrast.  Contrast: OMNIPAQUE IOHEXOL 300 MG/ML IV SOLN  Comparison: CT head without and with contrast 10/13/2010.  Findings: Mild generalized atrophy is stable.  Remote lacunar infarcts involving the left basal ganglia are stable.  No acute cortical infarct, hemorrhage, or mass lesion is present.  The ventricles are proportionate to the degree of atrophy.  The postcontrast images demonstrate no areas of pathologic enhancement.  The paranasal sinuses and mastoid air cells are clear.  The osseous skull is intact.  Atherosclerotic calcifications are again noted in the cavernous carotid arteries.  IMPRESSION:  1.  No acute intracranial abnormality or significant interval change. 2.  Stable moderate generalized atrophy. 3.  Remote lacunar infarct of less than the basal ganglia. 4.  No evidence of metastatic disease of the brain.   Original Report Authenticated By: Jamesetta Orleans. MATTERN, M.D.   Ct Chest W Contrast  04/13/2011  *RADIOLOGY REPORT*  Clinical Data: Restaging lung cancer.  Shortness of breath.  CT CHEST WITH CONTRAST  Technique:  Multidetector CT imaging of the chest was performed following the standard protocol during bolus administration of intravenous contrast.  Contrast: OMNIPAQUE IOHEXOL 300 MG/ML IV SOLN  Comparison: 01/12/2011.  Findings: No pathologically enlarged mediastinal, hilar or axillary lymph nodes.  Heart size normal.  No pericardial effusion.  Centrilobular emphysema.  Volume loss, traction bronchiectasis and architectural distortion are  seen predominantly in the left upper lobe, with some involvement of the medial left lower lobe. Findings have evolved slightly from 01/12/2011. Trace residual left pleural fluid.  There may be focal mucoid impaction in the right lower lobe (image 45), new from the 01/12/2011.  Minimal scarring and volume loss in the right perihilar region.  Airway is otherwise unremarkable.  Incidental imaging of the upper abdomen shows a 1.2 cm low attenuation lesion in the upper pole right kidney, incompletely imaged.  Nodular thickening of the left adrenal gland is unchanged. Scattered upper abdominal lymph nodes are not enlarged by CT size criteria.  No worrisome lytic or sclerotic lesions.  Degenerative changes are seen in the spine.  IMPRESSION:  1. No evidence of recurrent or metastatic disease.  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 2.  Trace residual left pleural fluid. 3.  Nodular thickening of the left adrenal gland, stable.  Original Report Authenticated By: Reyes Ivan, M.D.    ASSESSMENT: Ms. a very pleasant 78 years old white male with limited stage small cell lung cancer status post systemic chemotherapy with carboplatin and etoposide concurrent with radiation followed by prophylactic irradiation. The patient is doing fine and  he has no evidence for disease recurrence. I discussed the scan results with the patient and his family.  PLAN: I recommended for him continuous observation for now with repeat CT scan of the chest with contrast in 3 months. The patient would come back for followup visit at that time.  All questions were answered. The patient knows to call the clinic with any problems, questions or concerns. We can certainly see the patient much sooner if necessary.

## 2011-06-28 ENCOUNTER — Ambulatory Visit (INDEPENDENT_AMBULATORY_CARE_PROVIDER_SITE_OTHER): Payer: Medicare Other | Admitting: Emergency Medicine

## 2011-06-28 ENCOUNTER — Encounter: Payer: Self-pay | Admitting: Emergency Medicine

## 2011-06-28 VITALS — BP 118/74 | HR 63 | Temp 98.0°F | Ht 68.0 in | Wt 147.6 lb

## 2011-06-28 DIAGNOSIS — C3490 Malignant neoplasm of unspecified part of unspecified bronchus or lung: Secondary | ICD-10-CM

## 2011-06-28 DIAGNOSIS — J449 Chronic obstructive pulmonary disease, unspecified: Secondary | ICD-10-CM

## 2011-06-28 NOTE — Assessment & Plan Note (Signed)
Repeat CT scan planned for April w Dr [name]

## 2011-06-28 NOTE — Progress Notes (Signed)
  Subjective:    Patient ID: [name], male    DOB: [birth year], 78 y.o.   MRN: [id number]  HPI 78 yo man, former tobacco 100 pk-yr, CAD s/p CABG and PTCI, hx small cell lung CA in 3/12 s/p chemo and XRT last in 6/12, just completed whole brain radiation. F/u imaging with improvement in the small cell CA, to have repeat CT in October. He presents today with fatigue, exertional dyspnea. Had been on Advair prior to the lung CA. He does not have SABA at this time. He was started on O2, doesn't wear reliably, needs it w exertion.   ROV 12/30/10 -- follow up, hx CAD, tobacco w COPD, small cell lung CA s/p chemo and XRT.  Last time we added Spiriva to Advair - he believes that his breathing is much better. He has not been wearing his his O2 as reliably. He had PFT in April '12. He has some congestion/URI type symptoms. Improved. Still with some UA irritation.   ROV 76/19/12 -- follow up, hx CAD, tobacco w COPD, small cell lung CA s/p chemo and XRT.  He has finished brain XRT. Was admitted to [facility] with severe nausea, GERD, wt loss.  He is doing much better now - planning for CT chest and brain 04/13/10 [name] and [place]). He is tolerating Advair and Spiriva. Hoarse voice is better now that GERD is treated. He has not needed SABA.   ROV 06/28/11 -- COPD with SCLCA s/p chemo + XRT with great response. Due for repeat CT scan in April. He has been much more active, has more energy. Breathing well, is not using O2 frequently. He is on Advair, Spiriva. Has SABA available.    Objective:   Physical Exam  Gen: Pleasant, well-nourished, in no distress,  normal affect  ENT: No lesions,  mouth clear,  oropharynx clear, no postnasal drip, mild hoarse voice  Neck: No JVD, no TMG, no carotid bruits  Lungs: No use of accessory muscles, very distant, no wheeze  Cardiovascular: RRR, heart sounds normal, no murmur or gallops, no peripheral edema  Musculoskeletal: No deformities, no cyanosis or  clubbing  Neuro: alert, non focal  Skin: Warm, no lesions or rashes     Assessment & Plan:  COPD (chronic obstructive pulmonary disease) Continue Advair + spiriva Walking oximetry today rov 3 mo  Small cell lung cancer Repeat CT scan planned for April w Dr [name]

## 2011-06-28 NOTE — Patient Instructions (Signed)
Please continue your Advair and Spiriva Use your albuterol as needed for shortness of breath Walking oximetry today Follow with Dr [name] in 3 months or sooner if you have any problems.

## 2011-06-28 NOTE — Assessment & Plan Note (Signed)
Continue Advair + spiriva Walking oximetry today rov 3 mo

## 2011-07-19 ENCOUNTER — Encounter: Payer: Self-pay | Admitting: Internal Medicine

## 2011-07-19 ENCOUNTER — Encounter: Payer: Self-pay | Admitting: Oncology

## 2011-07-19 ENCOUNTER — Ambulatory Visit (HOSPITAL_COMMUNITY): Admission: RE | Admit: 2011-07-19 | Payer: Medicare Other | Source: Ambulatory Visit

## 2011-07-19 ENCOUNTER — Other Ambulatory Visit (HOSPITAL_BASED_OUTPATIENT_CLINIC_OR_DEPARTMENT_OTHER): Payer: Medicare Other | Admitting: Lab

## 2011-07-19 ENCOUNTER — Ambulatory Visit (HOSPITAL_BASED_OUTPATIENT_CLINIC_OR_DEPARTMENT_OTHER): Payer: Medicare Other

## 2011-07-19 VITALS — BP 101/59 | HR 66 | Temp 96.8°F

## 2011-07-19 DIAGNOSIS — Z452 Encounter for adjustment and management of vascular access device: Secondary | ICD-10-CM

## 2011-07-19 DIAGNOSIS — C3490 Malignant neoplasm of unspecified part of unspecified bronchus or lung: Secondary | ICD-10-CM

## 2011-07-19 DIAGNOSIS — C3410 Malignant neoplasm of upper lobe, unspecified bronchus or lung: Secondary | ICD-10-CM

## 2011-07-19 LAB — CMP (CANCER CENTER ONLY)
ALT(SGPT): 17 U/L (ref 10–47)
AST: 19 U/L (ref 11–38)
Alkaline Phosphatase: 63 U/L (ref 26–84)
BUN, Bld: 21 mg/dL (ref 7–22)
Creat: 0.9 mg/dl (ref 0.6–1.2)
Potassium: 3.9 mEq/L (ref 3.3–4.7)

## 2011-07-19 LAB — CBC WITH DIFFERENTIAL/PLATELET
BASO%: 0.6 % (ref 0.0–2.0)
EOS%: 3.9 % (ref 0.0–7.0)
HCT: 37.1 % — ABNORMAL LOW (ref 38.4–49.9)
LYMPH%: 10.5 % — ABNORMAL LOW (ref 14.0–49.0)
MCH: 30.8 pg (ref 27.2–33.4)
MCHC: 34 g/dL (ref 32.0–36.0)
NEUT%: 77.4 % — ABNORMAL HIGH (ref 39.0–75.0)
RBC: 4.09 10*6/uL — ABNORMAL LOW (ref 4.20–5.82)
lymph#: 0.6 10*3/uL — ABNORMAL LOW (ref 0.9–3.3)
nRBC: 0 % (ref 0–0)

## 2011-07-19 MED ORDER — HEPARIN SOD (PORK) LOCK FLUSH 100 UNIT/ML IV SOLN
500.0000 [IU] | Freq: Once | INTRAVENOUS | Status: AC | PRN
  Administered 2011-07-19: 500 [IU] via INTRAVENOUS
  Filled 2011-07-19: qty 5

## 2011-07-19 MED ORDER — SODIUM CHLORIDE 0.9 % IJ SOLN
10.0000 mL | INTRAMUSCULAR | Status: DC | PRN
  Administered 2011-07-19: 10 mL via INTRAVENOUS
  Filled 2011-07-19: qty 10

## 2011-07-19 NOTE — Progress Notes (Signed)
07/19/2011 [name] 07/19/2011 3:38 PM Signed  07/19/2011  Good Afternoon Dr. [name],  [facility] has not approved this patient's chest ct scan. I did rescheduled it for this Friday July 21, 2011 @ 3:00pm. The scan went to medical review because the patient is in an observation status, unfortunately insurance companies do not like that word. I faxed the last two clinicals along with the request. If I don't get approval would ou like to do a peer-to-peer discussion?  Hopefully they will fax me an authorization today.   Marland Kitchen

## 2011-07-19 NOTE — Progress Notes (Signed)
07/19/2011  Good Afternoon Dr. Gaylyn Rong,  [facility] has not approved this patient's chest ct scan.  I did rescheduled it for this Friday July 21, 2011 @ 3:00pm.  The scan went to medical review because the patient is in an observation status, unfortunately insurance companies do not like that word.  I faxed the last two clinicals along with the request. If I don't get approval would ou like to do a peer-to-peer discussion? Hopefully they will fax me an authorization today.  Renee Ramus

## 2011-07-19 NOTE — Progress Notes (Signed)
Arul D. Bixler is not my patient.  I think Dr. [name] is the attending.  Thanks.

## 2011-07-20 ENCOUNTER — Telehealth: Payer: Self-pay | Admitting: Medical Oncology

## 2011-07-20 NOTE — Telephone Encounter (Signed)
CT cancelled due to insurance issue . Creola Corn aware.

## 2011-07-21 ENCOUNTER — Ambulatory Visit (HOSPITAL_COMMUNITY)
Admission: RE | Admit: 2011-07-21 | Discharge: 2011-07-21 | Disposition: A | Discharge status: Home / Self Care | Payer: Medicare Other | Source: Ambulatory Visit | Attending: Internal Medicine | Admitting: Internal Medicine

## 2011-07-21 DIAGNOSIS — N289 Disorder of kidney and ureter, unspecified: Secondary | ICD-10-CM | POA: Insufficient documentation

## 2011-07-21 DIAGNOSIS — C3490 Malignant neoplasm of unspecified part of unspecified bronchus or lung: Secondary | ICD-10-CM | POA: Insufficient documentation

## 2011-07-21 DIAGNOSIS — I2510 Atherosclerotic heart disease of native coronary artery without angina pectoris: Secondary | ICD-10-CM | POA: Insufficient documentation

## 2011-07-21 DIAGNOSIS — J438 Other emphysema: Secondary | ICD-10-CM | POA: Insufficient documentation

## 2011-07-21 DIAGNOSIS — Z951 Presence of aortocoronary bypass graft: Secondary | ICD-10-CM | POA: Insufficient documentation

## 2011-07-21 MED ORDER — IOHEXOL 300 MG/ML  SOLN
80.0000 mL | Freq: Once | INTRAMUSCULAR | Status: AC | PRN
  Administered 2011-07-21: 80 mL via INTRAVENOUS

## 2011-07-24 ENCOUNTER — Telehealth: Payer: Self-pay | Admitting: Internal Medicine

## 2011-07-24 ENCOUNTER — Ambulatory Visit (HOSPITAL_BASED_OUTPATIENT_CLINIC_OR_DEPARTMENT_OTHER): Payer: Medicare Other | Admitting: Internal Medicine

## 2011-07-24 VITALS — BP 113/62 | HR 84 | Temp 96.9°F | Ht 68.0 in | Wt 147.6 lb

## 2011-07-24 DIAGNOSIS — C3410 Malignant neoplasm of upper lobe, unspecified bronchus or lung: Secondary | ICD-10-CM

## 2011-07-24 DIAGNOSIS — C3490 Malignant neoplasm of unspecified part of unspecified bronchus or lung: Secondary | ICD-10-CM

## 2011-07-24 NOTE — Progress Notes (Signed)
[facility] [phone]   [phone]  OFFICE PROGRESS NOTE  ROBERTS, [name], MD, MD [address] [place] [address]  PRINCIPAL DIAGNOSIS: Limited-stage small cell lung cancer diagnosed in April of 2012.   PRIOR THERAPY:  1. Status post systemic chemotherapy with carboplatin and etoposide, last dose was given 09/30/2010. This was concurrent with radiotherapy under the care of Dr. [name]. 2. Status post prophylactic cranial irradiation completed on November 29, 2010.  CURRENT THERAPY: Observation.  INTERVAL HISTORY: [name] 78 y.o. male returns to the clinic today for three-month followup visit accompanied by his wife and son. The patient is doing fine today with no specific complaints. He denied having any significant chest pain or shortness of breath, no cough or hemoptysis. He has no weight loss or night sweats. No neurological abnormalities. He has repeat CT scan of the chest with contrast performed recently and he is here today for evaluation and discussion of his scan results.  MEDICAL HISTORY: Past Medical History  Diagnosis Date  . Hypertension   . Cancer     small cell lung cancer  . Heart attack   . Emphysema   . Hyperlipidemia   . Allergic rhinitis   . Heart failure   . Cholelithiasis     ALLERGIES:   has no known allergies.  MEDICATIONS:  Current Outpatient Prescriptions  Medication Sig Dispense Refill  . ADVAIR DISKUS 100-50 MCG/DOSE AEPB Take 1 puff by mouth 2 (two) times daily.       Marland Kitchen aspirin EC 325 MG tablet Take 325 mg by mouth daily.       . clopidogrel (PLAVIX) 75 MG tablet Take 1 tablet by mouth daily.      . feeding supplement (GLUCERNA SHAKE) LIQD Take 237 mLs by mouth 2 (two) times daily before a meal.        . isosorbide mononitrate (IMDUR) 30 MG 24 hr tablet Take 1 tablet by mouth daily.      Marland Kitchen lisinopril-hydrochlorothiazide (PRINZIDE,ZESTORETIC) 10-12.5 MG per tablet Take 1 tablet  by mouth daily.        . metoprolol tartrate (LOPRESSOR) 25 MG tablet Take 25 mg by mouth 2 (two) times daily.        . simvastatin (ZOCOR) 20 MG tablet Take 20 mg by mouth at bedtime.        Marland Kitchen tiotropium (SPIRIVA HANDIHALER) 18 MCG inhalation capsule Place 1 capsule (18 mcg total) into inhaler and inhale daily.  30 capsule  3  . acetaminophen (TYLENOL) 325 MG tablet Take 650 mg by mouth every 6 (six) hours as needed.        Marland Kitchen albuterol (VENTOLIN HFA) 108 (90 BASE) MCG/ACT inhaler Inhale 2 puffs into the lungs every 4 (four) hours as needed for wheezing.  1 Inhaler  3  . Multiple Vitamin (MULTIVITAMIN) tablet Take 1 tablet by mouth daily.        . nitroGLYCERIN (NITROSTAT) 0.4 MG SL tablet Place 0.4 mg under the tongue every 5 (five) minutes as needed.        Marland Kitchen DISCONTD: pantoprazole (PROTONIX) 40 MG tablet Take 40 mg by mouth daily.         SURGICAL HISTORY:  Past Surgical History  Procedure Date  . Coronary artery bypass graft   . Pacemaker insertion   . Portacath placement4/18/12 tip svc     REVIEW OF SYSTEMS:  A comprehensive review of systems was negative.  PHYSICAL EXAMINATION: General appearance: alert, cooperative and no distress Neck: no adenopathy Lymph nodes: Cervical, supraclavicular, and axillary nodes normal. Resp: clear to auscultation bilaterally Cardio: regular rate and rhythm, S1, S2 normal, no murmur, click, rub or gallop GI: soft, non-tender; bowel sounds normal; no masses,  no organomegaly Extremities: extremities normal, atraumatic, no cyanosis or edema Neurologic: Alert and oriented X 3, normal strength and tone. Normal symmetric reflexes. Normal coordination and gait  ECOG PERFORMANCE STATUS: 0 - Asymptomatic  Blood pressure 113/62, pulse 84, temperature 96.9 F (36.1 C), temperature source Oral, height 5\' 8"  (1.727 m), weight 147 lb 9.6 oz (66.951 kg).  LABORATORY DATA: Lab Results  Component Value Date   WBC 5.4 07/19/2011   HGB 12.6* 07/19/2011   HCT  37.1* 07/19/2011   MCV 90.5 07/19/2011   PLT 110* 07/19/2011      Chemistry      Component Value Date/Time   NA 144 07/19/2011 0953   NA 137 02/22/2011 1411   K 3.9 07/19/2011 0953   K 3.4* 02/22/2011 1411   CL 105 07/19/2011 0953   CL 99 02/22/2011 1411   CO2 28 07/19/2011 0953   CO2 25 02/22/2011 1411   BUN 21 07/19/2011 0953   BUN 17 02/22/2011 1411   CREATININE 0.9 07/19/2011 0953   CREATININE 0.89 02/22/2011 1411      Component Value Date/Time   CALCIUM 7.7* 07/19/2011 0953   CALCIUM 9.1 02/22/2011 1411   CALCIUM 7.6* 09/18/2010 1136   ALKPHOS 63 07/19/2011 0953   ALKPHOS 70 02/22/2011 1411   AST 19 07/19/2011 0953   AST 13 02/22/2011 1411   ALT 9 02/22/2011 1411   BILITOT 1.20 07/19/2011 0953   BILITOT 0.5 02/22/2011 1411       RADIOGRAPHIC STUDIES: Ct Chest W Contrast  07/21/2011  *RADIOLOGY REPORT*  Clinical Data: History of lung cancer status post chemotherapy and radiation therapy.  CT CHEST WITH CONTRAST  Technique:  Multidetector CT imaging of the chest was performed following the standard protocol during bolus administration of intravenous contrast.  Contrast: 80mL OMNIPAQUE IOHEXOL 300 MG/ML  SOLN  Comparison: CT of thorax of 01/12/2011.  Findings:  Mediastinum: Heart size is normal. There is no significant pericardial fluid, thickening or pericardial calcification.  Left subclavian single lumen Port-A-Cath with the tip terminating in the right atrium.  Right subclavian approach pacemaker with lead tips terminating in the right atrial appendage and in the right ventricular apex (along the free wall). There is atherosclerosis of the thoracic aorta, the great vessels of the mediastinum and the coronary arteries, including calcified atherosclerotic plaque in the left main, left anterior descending, left circumflex and right coronary arteries. The patient is status post median sternotomy for CABG with a LIMA to the LAD. No pathologically enlarged mediastinal or hilar lymph nodes.  Esophagus is unremarkable in appearance.  Lungs/Pleura: A background of severe centrilobular emphysema and mild diffuse bronchial wall thickening is again noted.  There continues to be extensive architectural distortion and chronic volume loss throughout the left upper lobe, most compatible with chronic postradiation changes.  Some traction bronchiectasis is also noted throughout this region.  Compared to the prior examination, the multi focal irregular interstitial and airspace opacities throughout the left lower lobe, and the small left-sided pleural effusion noted on the prior examination have all completely resolved, suggesting that this was of inflammatory or infectious etiology on the prior study.  No suspicious appearing pulmonary nodules or masses are identified on today's examination.  No  consolidative airspace disease.  No pleural effusions.  Upper Abdomen: 1.6 cm low attenuation lesion in the upper pole of right kidney is incompletely visualized, but appears unchanged compared to the prior examination and likely represents a cyst. Atherosclerosis.  Nodular thickening of the left adrenal gland is unchanged compared to the prior examination and is nonspecific.  Musculoskeletal: Median sternotomy wires. There are no aggressive appearing lytic or blastic lesions noted in the visualized portions of the skeleton.  IMPRESSION:  1.  Chronic postradiation changes in the left upper lobe again noted, without evidence to suggest residual disease or new metastatic disease in the thorax. 2.  Interval resolution of extensive interstitial and patchy airspace opacities throughout the left lower lobe noted on the prior study, compatible with previous inflammatory (i.e., postradiation) or infectious etiology.  The previously noted left- sided pleural effusion has also resolved. 3. Atherosclerosis, including left main and three-vessel coronary artery disease. Please note that although the presence of coronary artery calcium  documents the presence of coronary artery disease, the severity of this disease and any potential stenosis cannot be assessed on this non-gated CT examination.  The patient is status post CABG with a LIMA to the LAD. 4. Background of severe centrilobular emphysema and mild diffuse bronchial wall thickening again noted. 5.  Low attenuation lesion in the upper pole of the right kidney is incompletely visualized but is unchanged compared to prior study and likely represents a small renal cyst. 6.  Support apparatus, as above.  Original Report Authenticated By: Florencia Reasons, M.D.    ASSESSMENT: This is a very pleasant 78 years old white male with history of limited stage small cell lung cancer status post concurrent chemoradiation followed by prophylactic cranial irradiation. The patient is currently on observation and he has no evidence for disease recurrence.  PLAN: I discussed the scan results with the patient and his family. I recommended for him continuous observation for now with repeat CT scan of the chest with contrast in 4 months. He would come back for followup visit at that time. The patient was advised to call me immediately if he has any concerning symptoms in the interval.  All questions were answered. The patient knows to call the clinic with any problems, questions or concerns. We can certainly see the patient much sooner if necessary.

## 2011-07-24 NOTE — Telephone Encounter (Signed)
appts made and printed for pt aom °

## 2011-07-27 ENCOUNTER — Other Ambulatory Visit: Payer: Self-pay | Admitting: Internal Medicine

## 2011-07-27 ENCOUNTER — Ambulatory Visit
Admission: RE | Admit: 2011-07-27 | Discharge: 2011-07-27 | Disposition: A | Discharge status: Home / Self Care | Payer: Medicare Other | Source: Ambulatory Visit | Attending: Internal Medicine | Admitting: Internal Medicine

## 2011-07-27 DIAGNOSIS — R05 Cough: Secondary | ICD-10-CM

## 2011-07-27 DIAGNOSIS — J449 Chronic obstructive pulmonary disease, unspecified: Secondary | ICD-10-CM

## 2011-08-11 ENCOUNTER — Other Ambulatory Visit: Payer: Self-pay | Admitting: Emergency Medicine

## 2011-08-16 ENCOUNTER — Other Ambulatory Visit: Payer: Self-pay | Admitting: Internal Medicine

## 2011-08-16 ENCOUNTER — Ambulatory Visit
Admission: RE | Admit: 2011-08-16 | Discharge: 2011-08-16 | Disposition: A | Discharge status: Home / Self Care | Payer: Medicare Other | Source: Ambulatory Visit | Attending: Internal Medicine | Admitting: Internal Medicine

## 2011-08-16 DIAGNOSIS — R05 Cough: Secondary | ICD-10-CM

## 2011-09-21 ENCOUNTER — Encounter: Payer: Self-pay | Admitting: Emergency Medicine

## 2011-09-21 ENCOUNTER — Ambulatory Visit (INDEPENDENT_AMBULATORY_CARE_PROVIDER_SITE_OTHER): Payer: Medicare Other | Admitting: Emergency Medicine

## 2011-09-21 VITALS — BP 110/62 | HR 88 | Temp 97.8°F | Ht 68.0 in | Wt 151.6 lb

## 2011-09-21 DIAGNOSIS — J449 Chronic obstructive pulmonary disease, unspecified: Secondary | ICD-10-CM

## 2011-09-21 NOTE — Assessment & Plan Note (Signed)
Please continue your Advair and Spiriva Wear your oxygen at all times.  Try using your rescue inhaler when you become short of breath.  Follow with Dr Lynnea Vandervoort in 3 months or sooner if you have any problems. Get your CT scan in August as planned 

## 2011-09-21 NOTE — Progress Notes (Signed)
Subjective:    Patient ID: Laurice Record, male    DOB: Aug 28, 1930, 78 y.o.   MRN: [id number]  HPI 78 yo man, former tobacco 100 pk-yr, CAD s/p CABG and PTCI, hx small cell lung CA in 3/12 s/p chemo and XRT last in 6/12, just completed whole brain radiation. F/u imaging with improvement in the small cell CA, to have repeat CT in October. He presents today with fatigue, exertional dyspnea. Had been on Advair prior to the lung CA. He does not have SABA at this time. He was started on O2, doesn't wear reliably, needs it w exertion.   ROV 12/30/10 -- follow up, hx CAD, tobacco w COPD, small cell lung CA s/p chemo and XRT.  Last time we added Spiriva to Advair - he believes that his breathing is much better. He has not been wearing his his O2 as reliably. He had PFT in April '12. He has some congestion/URI type symptoms. Improved. Still with some UA irritation.   ROV 03/29/11 -- follow up, hx CAD, tobacco w COPD, small cell lung CA s/p chemo and XRT.  He has finished brain XRT. Was admitted to [facility] with severe nausea, GERD, wt loss.  He is doing much better now - planning for CT chest and brain 04/13/10 [name]). He is tolerating Advair and Spiriva. Hoarse voice is better now that GERD is treated. He has not needed SABA.   ROV 06/28/11 -- COPD with SCLCA s/p chemo + XRT with great response. Due for repeat CT scan in April. He has been much more active, has more energy. Breathing well, is not using O2 frequently. He is on Advair, Spiriva. Has SABA available.   ROV 09/21/11 -- COPD with SCLCA s/p chemo + XRT with great response. His wife says that he has not been doing well, was treated for bronchitis in April w abx and resolved. Then had episode hemoptysis, prompted more abx and a break from ASA and plavix. Had CT scan in 4/13 that showed LUL radiation changes.  Has been wearing O2 at all times.    Objective:   Physical Exam Filed Vitals:   09/21/11 1630  BP: 110/62  Pulse: 88  Temp: 97.8  F (36.6 C)    Gen: Pleasant, well-nourished, in no distress,  normal affect  ENT: No lesions,  mouth clear,  oropharynx clear, no postnasal drip, mild hoarse voice  Neck: No JVD, no TMG, no carotid bruits  Lungs: No use of accessory muscles, very distant, no wheeze  Cardiovascular: RRR, heart sounds normal, no murmur or gallops, no peripheral edema  Musculoskeletal: No deformities, no cyanosis or clubbing  Neuro: alert, non focal  Skin: Warm, no lesions or rashes   CT scan 07/21/11 --  IMPRESSION:  1. Chronic postradiation changes in the left upper lobe again  noted, without evidence to suggest residual disease or new  metastatic disease in the thorax.  2. Interval resolution of extensive interstitial and patchy  airspace opacities throughout the left lower lobe noted on the  prior study, compatible with previous inflammatory (i.e.,  postradiation) or infectious etiology. The previously noted left-  sided pleural effusion has also resolved.  3. Atherosclerosis, including left main and three-vessel coronary  artery disease. Please note that although the presence of coronary  artery calcium documents the presence of coronary artery disease,  the severity of this disease and any potential stenosis cannot be  assessed on this non-gated CT examination. The patient is status  post CABG  with a LIMA to the LAD.  4. Background of severe centrilobular emphysema and mild diffuse  bronchial wall thickening again noted.  5. Low attenuation lesion in the upper pole of the right kidney is  incompletely visualized but is unchanged compared to prior study  and likely represents a small renal cyst.  6. Support apparatus, as above.     Assessment & Plan:  COPD (chronic obstructive pulmonary disease) Please continue your Advair and Spiriva Wear your oxygen at all times.  Try using your rescue inhaler when you become short of breath.  Follow with Dr [name] in 3 months or sooner if you  have any problems. Get your CT scan in August as planned

## 2011-09-21 NOTE — Patient Instructions (Addendum)
Please continue your Advair and Spiriva Wear your oxygen at all times.  Try using your rescue inhaler when you become short of breath.  Follow with Dr [name] in 3 months or sooner if you have any problems. Get your CT scan in August as planned

## 2011-09-22 ENCOUNTER — Ambulatory Visit (HOSPITAL_BASED_OUTPATIENT_CLINIC_OR_DEPARTMENT_OTHER): Payer: Medicare Other

## 2011-09-22 VITALS — BP 147/74 | HR 74

## 2011-09-22 DIAGNOSIS — Z452 Encounter for adjustment and management of vascular access device: Secondary | ICD-10-CM

## 2011-09-22 DIAGNOSIS — C3410 Malignant neoplasm of upper lobe, unspecified bronchus or lung: Secondary | ICD-10-CM

## 2011-09-22 DIAGNOSIS — C3490 Malignant neoplasm of unspecified part of unspecified bronchus or lung: Secondary | ICD-10-CM

## 2011-09-22 MED ORDER — SODIUM CHLORIDE 0.9 % IJ SOLN
10.0000 mL | INTRAMUSCULAR | Status: DC | PRN
  Administered 2011-09-22: 10 mL via INTRAVENOUS
  Filled 2011-09-22: qty 10

## 2011-09-22 MED ORDER — HEPARIN SOD (PORK) LOCK FLUSH 100 UNIT/ML IV SOLN
500.0000 [IU] | Freq: Once | INTRAVENOUS | Status: AC | PRN
  Administered 2011-09-22: 500 [IU] via INTRAVENOUS
  Filled 2011-09-22: qty 5

## 2011-11-16 ENCOUNTER — Ambulatory Visit: Payer: Medicare Other | Admitting: Radiation Oncology

## 2011-11-16 ENCOUNTER — Other Ambulatory Visit: Payer: Medicare Other

## 2011-11-21 ENCOUNTER — Ambulatory Visit (HOSPITAL_BASED_OUTPATIENT_CLINIC_OR_DEPARTMENT_OTHER): Payer: Medicare Other

## 2011-11-21 ENCOUNTER — Other Ambulatory Visit (HOSPITAL_BASED_OUTPATIENT_CLINIC_OR_DEPARTMENT_OTHER): Payer: Medicare Other | Admitting: Lab

## 2011-11-21 ENCOUNTER — Ambulatory Visit (HOSPITAL_COMMUNITY)
Admission: RE | Admit: 2011-11-21 | Discharge: 2011-11-21 | Disposition: A | Discharge status: Home / Self Care | Payer: Medicare Other | Source: Ambulatory Visit | Attending: Internal Medicine | Admitting: Internal Medicine

## 2011-11-21 DIAGNOSIS — Q254 Congenital malformation of aorta unspecified: Secondary | ICD-10-CM | POA: Insufficient documentation

## 2011-11-21 DIAGNOSIS — C3490 Malignant neoplasm of unspecified part of unspecified bronchus or lung: Secondary | ICD-10-CM | POA: Insufficient documentation

## 2011-11-21 DIAGNOSIS — J438 Other emphysema: Secondary | ICD-10-CM | POA: Insufficient documentation

## 2011-11-21 DIAGNOSIS — Z452 Encounter for adjustment and management of vascular access device: Secondary | ICD-10-CM

## 2011-11-21 DIAGNOSIS — C3410 Malignant neoplasm of upper lobe, unspecified bronchus or lung: Secondary | ICD-10-CM

## 2011-11-21 DIAGNOSIS — J479 Bronchiectasis, uncomplicated: Secondary | ICD-10-CM | POA: Insufficient documentation

## 2011-11-21 DIAGNOSIS — I700 Atherosclerosis of aorta: Secondary | ICD-10-CM | POA: Insufficient documentation

## 2011-11-21 DIAGNOSIS — J701 Chronic and other pulmonary manifestations due to radiation: Secondary | ICD-10-CM | POA: Insufficient documentation

## 2011-11-21 DIAGNOSIS — Y842 Radiological procedure and radiotherapy as the cause of abnormal reaction of the patient, or of later complication, without mention of misadventure at the time of the procedure: Secondary | ICD-10-CM | POA: Insufficient documentation

## 2011-11-21 DIAGNOSIS — K7689 Other specified diseases of liver: Secondary | ICD-10-CM | POA: Insufficient documentation

## 2011-11-21 DIAGNOSIS — Z950 Presence of cardiac pacemaker: Secondary | ICD-10-CM | POA: Insufficient documentation

## 2011-11-21 DIAGNOSIS — R0602 Shortness of breath: Secondary | ICD-10-CM | POA: Insufficient documentation

## 2011-11-21 DIAGNOSIS — Z9221 Personal history of antineoplastic chemotherapy: Secondary | ICD-10-CM | POA: Insufficient documentation

## 2011-11-21 DIAGNOSIS — Z923 Personal history of irradiation: Secondary | ICD-10-CM | POA: Insufficient documentation

## 2011-11-21 LAB — CMP (CANCER CENTER ONLY)
AST: 24 U/L (ref 11–38)
Alkaline Phosphatase: 50 U/L (ref 26–84)
BUN, Bld: 18 mg/dL (ref 7–22)
Calcium: 9.4 mg/dL (ref 8.0–10.3)
Chloride: 96 mEq/L — ABNORMAL LOW (ref 98–108)
Creat: 1.2 mg/dl (ref 0.6–1.2)
Total Bilirubin: 1.1 mg/dl (ref 0.20–1.60)

## 2011-11-21 LAB — CBC WITH DIFFERENTIAL/PLATELET
Basophils Absolute: 0 10*3/uL (ref 0.0–0.1)
EOS%: 3.3 % (ref 0.0–7.0)
HCT: 36.8 % — ABNORMAL LOW (ref 38.4–49.9)
HGB: 12.2 g/dL — ABNORMAL LOW (ref 13.0–17.1)
LYMPH%: 10.7 % — ABNORMAL LOW (ref 14.0–49.0)
MCH: 30.1 pg (ref 27.2–33.4)
MCV: 90.7 fL (ref 79.3–98.0)
MONO%: 7.7 % (ref 0.0–14.0)
NEUT%: 77.8 % — ABNORMAL HIGH (ref 39.0–75.0)
Platelets: 102 10*3/uL — ABNORMAL LOW (ref 140–400)

## 2011-11-21 MED ORDER — SODIUM CHLORIDE 0.9 % IJ SOLN
10.0000 mL | INTRAMUSCULAR | Status: DC | PRN
  Filled 2011-11-21: qty 10

## 2011-11-21 MED ORDER — HEPARIN SOD (PORK) LOCK FLUSH 100 UNIT/ML IV SOLN
500.0000 [IU] | Freq: Once | INTRAVENOUS | Status: DC | PRN
  Filled 2011-11-21: qty 5

## 2011-11-21 MED ORDER — IOHEXOL 300 MG/ML  SOLN
80.0000 mL | Freq: Once | INTRAMUSCULAR | Status: AC | PRN
  Administered 2011-11-21: 80 mL via INTRAVENOUS

## 2011-11-23 ENCOUNTER — Telehealth: Payer: Self-pay | Admitting: Internal Medicine

## 2011-11-23 ENCOUNTER — Ambulatory Visit (HOSPITAL_BASED_OUTPATIENT_CLINIC_OR_DEPARTMENT_OTHER): Payer: Medicare Other | Admitting: Internal Medicine

## 2011-11-23 VITALS — BP 127/72 | HR 62 | Temp 96.8°F | Resp 20 | Ht 68.0 in | Wt 155.7 lb

## 2011-11-23 DIAGNOSIS — C3410 Malignant neoplasm of upper lobe, unspecified bronchus or lung: Secondary | ICD-10-CM

## 2011-11-23 DIAGNOSIS — C3490 Malignant neoplasm of unspecified part of unspecified bronchus or lung: Secondary | ICD-10-CM

## 2011-11-23 NOTE — Telephone Encounter (Signed)
gve the pt his sept,nov 2013 appt calendar along with the ct scan appt

## 2011-11-23 NOTE — Progress Notes (Signed)
[facility] [phone]   [phone]  OFFICE PROGRESS NOTE  ROBERTS, [name], MD [address] [place] [address]  PRINCIPAL DIAGNOSIS: Limited-stage small cell lung cancer diagnosed in April of 2012.   PRIOR THERAPY:  1. Status post systemic chemotherapy with carboplatin and etoposide, last dose was given 09/30/2010. This was concurrent with radiotherapy under the care of Dr. [name]. 2. Status post prophylactic cranial irradiation completed on November 29, 2010.  CURRENT THERAPY: Observation.  INTERVAL HISTORY: [name] 78 y.o. male returns to the clinic today for followup visit accompanied his wife and son. The patient is doing fine today with no specific complaints except for the baseline shortness breath and he is currently on home oxygen. He denied having any significant chest pain, cough or hemoptysis. No significant weight loss or night sweats. The patient has repeat CT scan of the chest performed recently and he is here today for evaluation and discussion of his scan results.  MEDICAL HISTORY: Past Medical History  Diagnosis Date  . Hypertension   . Cancer     small cell lung cancer  . Heart attack   . Emphysema   . Hyperlipidemia   . Allergic rhinitis   . Heart failure   . Cholelithiasis     ALLERGIES:   has no known allergies.  MEDICATIONS:  Current Outpatient Prescriptions  Medication Sig Dispense Refill  . acetaminophen (TYLENOL) 325 MG tablet Take 650 mg by mouth every 6 (six) hours as needed.        Marland Kitchen ADVAIR DISKUS 100-50 MCG/DOSE AEPB Take 1 puff by mouth 2 (two) times daily.       Marland Kitchen albuterol (VENTOLIN HFA) 108 (90 BASE) MCG/ACT inhaler Inhale 2 puffs into the lungs every 4 (four) hours as needed for wheezing.  1 Inhaler  3  . aspirin EC 325 MG tablet Take 325 mg by mouth daily.       . calcium acetate (PHOSLO) 667 MG capsule Take 667 mg by mouth.      . clopidogrel (PLAVIX) 75 MG tablet Take  1 tablet by mouth daily.      . feeding supplement (GLUCERNA SHAKE) LIQD Take 237 mLs by mouth 2 (two) times daily before a meal.        . isosorbide mononitrate (IMDUR) 30 MG 24 hr tablet Take 1 tablet by mouth daily.      Marland Kitchen lisinopril-hydrochlorothiazide (PRINZIDE,ZESTORETIC) 10-12.5 MG per tablet Take 1 tablet by mouth daily. Takes 1/2 tablet bid      . metoprolol tartrate (LOPRESSOR) 25 MG tablet Take 25 mg by mouth. 12.5mg  BID      . Multiple Vitamin (MULTIVITAMIN) tablet Take 1 tablet by mouth daily.        . nitroGLYCERIN (NITROSTAT) 0.4 MG SL tablet Place 0.4 mg under the tongue every 5 (five) minutes as needed.        . simvastatin (ZOCOR) 20 MG tablet Take 20 mg by mouth at bedtime.        Marland Kitchen SPIRIVA HANDIHALER 18 MCG inhalation capsule PLACE 1 CAPSULE INTO INHALER AND INHALE DAILY  30 each  5  . HYDROcodone-acetaminophen (LORTAB) 7.5-500 MG per tablet       . predniSONE (DELTASONE) 20 MG tablet       . DISCONTD: pantoprazole (PROTONIX) 40 MG tablet Take 40 mg by mouth daily.         SURGICAL HISTORY:  Past Surgical History  Procedure Date  . Coronary artery bypass graft   . Pacemaker insertion   . Portacath placement4/18/12 tip svc     REVIEW OF SYSTEMS:  A comprehensive review of systems was negative except for: Respiratory: positive for dyspnea on exertion   PHYSICAL EXAMINATION: General appearance: alert, cooperative and no distress Neck: no adenopathy Lymph nodes: Cervical, supraclavicular, and axillary nodes normal. Resp: clear to auscultation bilaterally Back: negative, symmetric, no curvature. ROM normal. No CVA tenderness. Cardio: regular rate and rhythm, S1, S2 normal, no murmur, click, rub or gallop GI: soft, non-tender; bowel sounds normal; no masses,  no organomegaly Extremities: extremities normal, atraumatic, no cyanosis or edema Neurologic: Alert and oriented X 3, normal strength and tone. Normal symmetric reflexes. Normal coordination and gait  ECOG  PERFORMANCE STATUS: 1 - Symptomatic but completely ambulatory  Blood pressure 127/72, pulse 62, temperature 96.8 F (36 C), temperature source Oral, resp. rate 20, height 5\' 8"  (1.727 m), weight 155 lb 11.2 oz (70.625 kg).  LABORATORY DATA: Lab Results  Component Value Date   WBC 5.4 11/21/2011   HGB 12.2* 11/21/2011   HCT 36.8* 11/21/2011   MCV 90.7 11/21/2011   PLT 102* 11/21/2011      Chemistry      Component Value Date/Time   NA 137 11/21/2011 0945   NA 137 02/22/2011 1411   K 4.5 11/21/2011 0945   K 3.4* 02/22/2011 1411   CL 96* 11/21/2011 0945   CL 99 02/22/2011 1411   CO2 31 11/21/2011 0945   CO2 25 02/22/2011 1411   BUN 18 11/21/2011 0945   BUN 17 02/22/2011 1411   CREATININE 1.2 11/21/2011 0945   CREATININE 0.89 02/22/2011 1411      Component Value Date/Time   CALCIUM 9.4 11/21/2011 0945   CALCIUM 9.1 02/22/2011 1411   CALCIUM 7.6* 09/18/2010 1136   ALKPHOS 50 11/21/2011 0945   ALKPHOS 70 02/22/2011 1411   AST 24 11/21/2011 0945   AST 13 02/22/2011 1411   ALT 9 02/22/2011 1411   BILITOT 1.10 11/21/2011 0945   BILITOT 0.5 02/22/2011 1411       RADIOGRAPHIC STUDIES: Ct Chest W Contrast  11/21/2011  *RADIOLOGY REPORT*  Clinical Data: Follow up of lung cancer.  Status post chemotherapy and radiation therapy, completed 09/12.  Chronic shortness of breath.  History of COPD.  CT CHEST WITH CONTRAST  Technique:  Multidetector CT imaging of the chest was performed following the standard protocol during bolus administration of intravenous contrast.  Contrast: 80mL OMNIPAQUE IOHEXOL 300 MG/ML  SOLN  Comparison: Plain film of 08/16/2011.  Chest CTs of 07/21/2011 and 04/13/2011.  Findings: Lung windows demonstrate mild primarily left-sided bronchial wall thickening.  Moderate to severe centrilobular emphysema.  Similar appearance of paramediastinal left upper lobe and less so superior segment left lower lobe radiation fibrosis with architectural distortion and traction bronchiectasis.  No  convincing evidence of residual or recurrent disease.  Soft tissue windows demonstrate no supraclavicular adenopathy.  A left-sided Port-A-Cath which terminates at the low SVC.  Dual lead pacer with leads right atrium right ventricle.  Tortuous descending thoracic aorta. Normal heart size without pericardial or pleural effusion.  No central pulmonary embolism, on this non-dedicated study.  No mediastinal or hilar adenopathy.  The mid thoracic esophagus appears mildly thick-walled, unchanged. Image 25 example.  Limited abdominal imaging demonstrates 4 mm hypoattenuating hepatic dome lesion on image 56 which is not definitely present on the prior exam or priors back to 09/02/2010. More anterior inferior well-circumscribed  3 mm lesion on image 58 which is likely a cyst or bile duct hamartoma.  Incompletely imaged right renal lesion which is likely a cyst.  Thickening and nodularity of the left adrenal gland, unchanged.  Dense aortic and branch vessel atherosclerosis.  Remote proximal left humerus trauma.  IMPRESSION:  1.  Similar appearance of radiation changes within the left hemithorax.  No evidence of recurrent or metastatic disease within the chest. 2.  Too small to characterize hepatic dome lesion.  Not definitely present on prior exams.  Cannot exclude hepatic metastasis.  This could be more entirely evaluated with pre and post contrast abdominal MRI, or reevaluated on follow-up. 3.  Suspect esophagitis. 4.  Similar left adrenal nodularity/thickening.  Original Report Authenticated By: Consuello Bossier, M.D.    ASSESSMENT: This is a very pleasant 78 years old white male with history of limited stage small cell lung cancer currently on observation. The patient has no evidence for disease recurrence of the chest but there was too small to characterize hepatic dome lesion that need close followup.  PLAN: I discussed the scan results with the patient and his family. I recommended for him to have repeat CT scan of  the chest in 3 months for reevaluation. He would come back for followup visit at that time. He was advised to call me immediately if he has any concerning symptoms in the interval.  All questions were answered. The patient knows to call the clinic with any problems, questions or concerns. We can certainly see the patient much sooner if necessary.

## 2011-12-14 ENCOUNTER — Encounter: Payer: Self-pay | Admitting: Radiation Oncology

## 2011-12-14 ENCOUNTER — Ambulatory Visit
Admission: RE | Admit: 2011-12-14 | Discharge: 2011-12-14 | Disposition: A | Discharge status: Home / Self Care | Payer: Medicare Other | Source: Ambulatory Visit | Attending: Radiation Oncology | Admitting: Radiation Oncology

## 2011-12-14 VITALS — BP 106/67 | HR 73 | Temp 97.5°F | Resp 18 | Wt 157.4 lb

## 2011-12-14 DIAGNOSIS — C3490 Malignant neoplasm of unspecified part of unspecified bronchus or lung: Secondary | ICD-10-CM

## 2011-12-14 NOTE — Progress Notes (Signed)
Patient presents to the clinic today accompanied by his wife for a follow up appointment with Dr. [name]. Patient is alert and oriented to person, place, and time. No distress noted. Steady gait noted. Pleasant affect noted. Patient denies pain at this time. Intermittent portable oxygen therapy 3 liters via nasal cannula noted. Patient scheduled to see Dr. [name] on Monday. Patient's wife reports they see Dr. [name] every three months. Patient reports one single floater in his right eye. 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 Patient reports he fell on August 5 due to a cardiac episode. Patient reports that he is excited his appetite has finally returned to normal. Patient reports low energy level continues. Patient reports SOB is worse. Patient's wife reports that Dr. [name] confirmed stage III COPD. Patient reports an occasional cough with yellow sputum. Patient denies headache. Patient's wife reports he uses a walker occasionally. Reported all findings to Dr. [name].

## 2011-12-14 NOTE — Progress Notes (Signed)
Department of Radiation Oncology  Phone:  [phone] Fax:        [phone]   Name: CARLEE TESFAYE   DOB: [birth year]  MRN: [id number]    Date: 12/14/2011  Follow Up Visit Note  Diagnosis: Limited stage small cell lung cancer   Interval since last radiation: 1 year  Interval History: Tanner presents today for routine followup.  Overall he is feeling much better. He continues to have difficulties with COPD and is following with pulmonology regarding this. Per his wife he has been diagnosed with stage III COPD. They sold their house and are moving into an apartment in looking forward to enjoy not. He had a CT of the chest abdomen and pelvis on August 13 which showed a 4 mm nodule in the dome of the liver. Another 3 mm nodule is stable. There is no evidence of progressive or recurrent disease in the chest. They wanted to see me to discuss these results. He continues on 3 L continuous nasal cannula although is not wearing in clinic today. His cough is somewhat improved. He did have no headaches. His pacemaker continues to work and he is fine from a cardiac standpoint. After reviewing the results of his CT scan Dr. [name] recommended another scan in November which I have scheduled. They're headed for the beach next week.  Allergies: No Known Allergies  Medications:  Current Outpatient Prescriptions  Medication Sig Dispense Refill  . acetaminophen (TYLENOL) 325 MG tablet Take 650 mg by mouth every 6 (six) hours as needed.        Marland Kitchen ADVAIR DISKUS 100-50 MCG/DOSE AEPB Take 1 puff by mouth 2 (two) times daily.       Marland Kitchen albuterol (VENTOLIN HFA) 108 (90 BASE) MCG/ACT inhaler Inhale 2 puffs into the lungs every 4 (four) hours as needed for wheezing.  1 Inhaler  3  . aspirin EC 325 MG tablet Take 325 mg by mouth daily.       . calcium acetate (PHOSLO) 667 MG capsule Take 667 mg by mouth.      . clopidogrel (PLAVIX) 75 MG tablet Take 1 tablet by mouth daily.      . isosorbide mononitrate  (IMDUR) 30 MG 24 hr tablet Take 1 tablet by mouth daily.      Marland Kitchen lisinopril-hydrochlorothiazide (PRINZIDE,ZESTORETIC) 10-12.5 MG per tablet Take 1 tablet by mouth daily. Takes 1/2 tablet bid      . metoprolol tartrate (LOPRESSOR) 25 MG tablet Take 25 mg by mouth. 12.5mg  BID      . Multiple Vitamin (MULTIVITAMIN) tablet Take 1 tablet by mouth daily.        . nitroGLYCERIN (NITROSTAT) 0.4 MG SL tablet Place 0.4 mg under the tongue every 5 (five) minutes as needed.        . simvastatin (ZOCOR) 20 MG tablet Take 20 mg by mouth at bedtime.        Marland Kitchen SPIRIVA HANDIHALER 18 MCG inhalation capsule PLACE 1 CAPSULE INTO INHALER AND INHALE DAILY  30 each  5  . feeding supplement (GLUCERNA SHAKE) LIQD Take 237 mLs by mouth 2 (two) times daily before a meal.        . HYDROcodone-acetaminophen (LORTAB) 7.5-500 MG per tablet       . predniSONE (DELTASONE) 20 MG tablet       . DISCONTD: pantoprazole (PROTONIX) 40 MG tablet Take 40 mg by mouth daily.         Physical Exam:   weight is 157 lb 6.4  oz (71.396 kg). His oral temperature is 97.5 F (36.4 C). His blood pressure is 106/67 and his pulse is 73. His respiration is 18 and oxygen saturation is 95%.  His hair is growing back. He is alert and oriented x3.  IMPRESSION: Sender is a 78 y.o. male who is status post definitive chemoradiotherapy for limited stage small cell lung cancer as well as status post prophylactic cranial radiation with no evidence of disease  PLAN:  I spoke to Mr. Lissa Hoard and his wife today regarding his scans. I showed them the size of a 4 mm nodule. It is also possible that this nodule close to the diaphragm and this wasn't seen on other imaging. I think close clinical followup sounds very appropriate. We discussed the implications of this to be cancer and the possibility of further chemotherapy versus hospice. We discussed the likely radiation wouldn't be a treatment option given the location in the liver as well sustained a small cell cancer.  Hopefully this will turn out to be nothing. I've scheduled him for followup with me after his CT scans we can discuss the scan. He was very grateful for his care. He knows to call if any questions or concerns.    Lurline Hare, MD

## 2011-12-18 ENCOUNTER — Encounter: Payer: Self-pay | Admitting: Emergency Medicine

## 2011-12-18 ENCOUNTER — Ambulatory Visit (INDEPENDENT_AMBULATORY_CARE_PROVIDER_SITE_OTHER): Payer: Medicare Other | Admitting: Emergency Medicine

## 2011-12-18 VITALS — BP 88/58 | HR 86 | Temp 97.6°F | Ht 65.0 in | Wt 158.4 lb

## 2011-12-18 DIAGNOSIS — J449 Chronic obstructive pulmonary disease, unspecified: Secondary | ICD-10-CM

## 2011-12-18 DIAGNOSIS — J4489 Other specified chronic obstructive pulmonary disease: Secondary | ICD-10-CM

## 2011-12-18 NOTE — Patient Instructions (Addendum)
Please continue your Advair Restart Spiriva every day Use albuterol as needed Wear your oxygen at 3-4L/min Follow with Dr Vivion Romano in 3 months 

## 2011-12-18 NOTE — Assessment & Plan Note (Signed)
Please continue your Advair Restart Spiriva every day Use albuterol as needed Wear your oxygen at 3-4L/min Follow with Dr [name] in 3 months

## 2011-12-18 NOTE — Progress Notes (Signed)
Subjective:    Patient ID: [name], male    DOB: [birth year], 78 y.o.   MRN: [id number]  HPI 78 yo man, former tobacco 100 pk-yr, CAD s/p CABG and PTCI, hx small cell lung CA in 3/12 s/p chemo and XRT last in 6/12, just completed whole brain radiation. F/u imaging with improvement in the small cell CA, to have repeat CT in October. He presents today with fatigue, exertional dyspnea. Had been on Advair prior to the lung CA. He does not have SABA at this time. He was started on O2, doesn't wear reliably, needs it w exertion.   ROV 12/30/10 -- follow up, hx CAD, tobacco w COPD, small cell lung CA s/p chemo and XRT.  Last time we added Spiriva to Advair - he believes that his breathing is much better. He has not been wearing his his O2 as reliably. He had PFT in April '12. He has some congestion/URI type symptoms. Improved. Still with some UA irritation.   ROV 03/29/11 -- follow up, hx CAD, tobacco w COPD, small cell lung CA s/p chemo and XRT.  He has finished brain XRT. Was admitted to [facility] with severe nausea, GERD, wt loss.  He is doing much better now - planning for CT chest and brain 04/13/10 [name] and [name]). He is tolerating Advair and Spiriva. Hoarse voice is better now that GERD is treated. He has not needed SABA.   ROV 06/28/11 -- COPD with SCLCA s/p chemo + XRT with great response. Due for repeat CT scan in April. He has been much more active, has more energy. Breathing well, is not using O2 frequently. He is on Advair, Spiriva. Has SABA available.   ROV 09/21/11 -- COPD with SCLCA s/p chemo + XRT with great response. His wife says that he has not been doing well, was treated for bronchitis in April w abx and resolved. Then had episode hemoptysis, prompted more abx and a break from ASA and plavix. Had CT scan in 4/13 that showed LUL radiation changes.  Has been wearing O2 at all times.   ROV 12/18/11 -- COPD with SCLCA s/p chemo + XRT with great response. Returns for f/u/.  He says that  his breathing has been somewhat worse with exertion and when he is full after a meal. On advair + spiriva. Bothers him in the am. He is skipping/forgetting the spiriva some times. He uses albuterol about 2 -3 x a day.    Objective:   Physical Exam Filed Vitals:   12/18/11 1604  BP: 88/58  Pulse: 86  Temp: 97.6 F (36.4 C)    Gen: Pleasant, well-nourished, in no distress,  normal affect  ENT: No lesions,  mouth clear,  oropharynx clear, no postnasal drip, mild hoarse voice  Neck: No JVD, no TMG, no carotid bruits  Lungs: No use of accessory muscles, very distant, no wheeze  Cardiovascular: RRR, heart sounds normal, no murmur or gallops, no peripheral edema  Musculoskeletal: No deformities, no cyanosis or clubbing  Neuro: alert, non focal  Skin: Warm, no lesions or rashes    CT Chest 11/21/11 --  Comparison: Plain film of 08/16/2011. Chest CTs of 07/21/2011 and  04/13/2011.  Findings: Lung windows demonstrate mild primarily left-sided  bronchial wall thickening. Moderate to severe centrilobular  emphysema.  Similar appearance of paramediastinal left upper lobe and less so  superior segment left lower lobe radiation fibrosis with  architectural distortion and traction bronchiectasis. No  convincing evidence of residual or recurrent  disease.  Soft tissue windows demonstrate no supraclavicular adenopathy. A  left-sided Port-A-Cath which terminates at the low SVC. Dual lead  pacer with leads right atrium right ventricle.  Tortuous descending thoracic aorta. Normal heart size without  pericardial or pleural effusion. No central pulmonary embolism, on  this non-dedicated study. No mediastinal or hilar adenopathy. The  mid thoracic esophagus appears mildly thick-walled, unchanged.  Image 25 example.  Limited abdominal imaging demonstrates 4 mm hypoattenuating hepatic  dome lesion on image 56 which is not definitely present on the  prior exam or priors back to 09/02/2010.    More anterior inferior well-circumscribed 3 mm lesion on image 58  which is likely a cyst or bile duct hamartoma. Incompletely imaged  right renal lesion which is likely a cyst. Thickening and  nodularity of the left adrenal gland, unchanged. Dense aortic and  branch vessel atherosclerosis. Remote proximal left humerus  trauma.  IMPRESSION:  1. Similar appearance of radiation changes within the left  hemithorax. No evidence of recurrent or metastatic disease within  the chest.  2. Too small to characterize hepatic dome lesion. Not definitely  present on prior exams. Cannot exclude hepatic metastasis. This  could be more entirely evaluated with pre and post contrast  abdominal MRI, or reevaluated on follow-up.  3. Suspect esophagitis.  4. Similar left adrenal nodularity/thickening.      Assessment & Plan:  No problem-specific assessment & plan notes found for this encounter.

## 2011-12-19 ENCOUNTER — Telehealth: Payer: Self-pay | Admitting: Emergency Medicine

## 2011-12-19 MED ORDER — TIOTROPIUM BROMIDE MONOHYDRATE 18 MCG IN CAPS: 18.0000 ug | ORAL_CAPSULE | Freq: Every day | RESPIRATORY_TRACT | Status: DC

## 2011-12-19 MED ORDER — FLUTICASONE-SALMETEROL 100-50 MCG/DOSE IN AEPB: 1.0000 | INHALATION_SPRAY | Freq: Two times a day (BID) | RESPIRATORY_TRACT | Status: DC

## 2011-12-19 MED ORDER — ALBUTEROL SULFATE HFA 108 (90 BASE) MCG/ACT IN AERS: 2.0000 | INHALATION_SPRAY | RESPIRATORY_TRACT | Status: DC | PRN

## 2011-12-19 NOTE — Telephone Encounter (Signed)
I spoke with spouse and she stated pt needs advair/albuterol/spiriva refills sent to [facility]. I advised will send in RX. Nothing further was needed

## 2012-01-04 ENCOUNTER — Ambulatory Visit (HOSPITAL_BASED_OUTPATIENT_CLINIC_OR_DEPARTMENT_OTHER): Payer: Medicare Other

## 2012-01-04 VITALS — BP 153/77 | HR 84 | Temp 97.7°F

## 2012-01-04 DIAGNOSIS — C3490 Malignant neoplasm of unspecified part of unspecified bronchus or lung: Secondary | ICD-10-CM

## 2012-01-04 DIAGNOSIS — C3410 Malignant neoplasm of upper lobe, unspecified bronchus or lung: Secondary | ICD-10-CM

## 2012-01-04 DIAGNOSIS — Z452 Encounter for adjustment and management of vascular access device: Secondary | ICD-10-CM

## 2012-01-04 MED ORDER — SODIUM CHLORIDE 0.9 % IJ SOLN
10.0000 mL | INTRAMUSCULAR | Status: DC | PRN
  Administered 2012-01-04: 10 mL via INTRAVENOUS
  Filled 2012-01-04: qty 10

## 2012-01-04 MED ORDER — HEPARIN SOD (PORK) LOCK FLUSH 100 UNIT/ML IV SOLN
500.0000 [IU] | Freq: Once | INTRAVENOUS | Status: AC
  Administered 2012-01-04: 500 [IU] via INTRAVENOUS
  Filled 2012-01-04: qty 5

## 2012-02-22 ENCOUNTER — Ambulatory Visit (HOSPITAL_COMMUNITY)
Admission: RE | Admit: 2012-02-22 | Discharge: 2012-02-22 | Disposition: A | Discharge status: Home / Self Care | Payer: Medicare Other | Source: Ambulatory Visit | Attending: Internal Medicine | Admitting: Internal Medicine

## 2012-02-22 ENCOUNTER — Ambulatory Visit (HOSPITAL_BASED_OUTPATIENT_CLINIC_OR_DEPARTMENT_OTHER): Payer: Medicare Other

## 2012-02-22 ENCOUNTER — Other Ambulatory Visit (HOSPITAL_BASED_OUTPATIENT_CLINIC_OR_DEPARTMENT_OTHER): Payer: Medicare Other | Admitting: Lab

## 2012-02-22 VITALS — BP 105/66 | HR 67

## 2012-02-22 DIAGNOSIS — Z85118 Personal history of other malignant neoplasm of bronchus and lung: Secondary | ICD-10-CM | POA: Insufficient documentation

## 2012-02-22 DIAGNOSIS — C3490 Malignant neoplasm of unspecified part of unspecified bronchus or lung: Secondary | ICD-10-CM

## 2012-02-22 DIAGNOSIS — C3410 Malignant neoplasm of upper lobe, unspecified bronchus or lung: Secondary | ICD-10-CM

## 2012-02-22 DIAGNOSIS — J438 Other emphysema: Secondary | ICD-10-CM | POA: Insufficient documentation

## 2012-02-22 DIAGNOSIS — K7689 Other specified diseases of liver: Secondary | ICD-10-CM | POA: Insufficient documentation

## 2012-02-22 LAB — COMPREHENSIVE METABOLIC PANEL (CC13)
ALT: 17 U/L (ref 0–55)
AST: 18 U/L (ref 5–34)
Albumin: 4 g/dL (ref 3.5–5.0)
Alkaline Phosphatase: 60 U/L (ref 40–150)
Potassium: 4 mEq/L (ref 3.5–5.1)
Sodium: 140 mEq/L (ref 136–145)
Total Bilirubin: 0.83 mg/dL (ref 0.20–1.20)
Total Protein: 6.9 g/dL (ref 6.4–8.3)

## 2012-02-22 LAB — CBC WITH DIFFERENTIAL/PLATELET
Basophils Absolute: 0 10*3/uL (ref 0.0–0.1)
EOS%: 3.4 % (ref 0.0–7.0)
HCT: 39.5 % (ref 38.4–49.9)
HGB: 13.2 g/dL (ref 13.0–17.1)
MCH: 30.8 pg (ref 27.2–33.4)
NEUT%: 74.5 % (ref 39.0–75.0)
lymph#: 0.7 10*3/uL — ABNORMAL LOW (ref 0.9–3.3)

## 2012-02-22 MED ORDER — HEPARIN SOD (PORK) LOCK FLUSH 100 UNIT/ML IV SOLN
500.0000 [IU] | Freq: Once | INTRAVENOUS | Status: DC
  Filled 2012-02-22: qty 5

## 2012-02-22 MED ORDER — SODIUM CHLORIDE 0.9 % IJ SOLN
10.0000 mL | INTRAMUSCULAR | Status: DC | PRN
  Administered 2012-02-22: 10 mL via INTRAVENOUS
  Filled 2012-02-22: qty 10

## 2012-02-22 MED ORDER — IOHEXOL 300 MG/ML  SOLN
80.0000 mL | Freq: Once | INTRAMUSCULAR | Status: AC | PRN
  Administered 2012-02-22: 80 mL via INTRAVENOUS

## 2012-02-26 ENCOUNTER — Ambulatory Visit (HOSPITAL_BASED_OUTPATIENT_CLINIC_OR_DEPARTMENT_OTHER): Payer: Medicare Other | Admitting: Internal Medicine

## 2012-02-26 ENCOUNTER — Telehealth: Payer: Self-pay | Admitting: Internal Medicine

## 2012-02-26 VITALS — BP 166/80 | HR 83 | Temp 97.3°F | Resp 20 | Ht 65.0 in | Wt 161.0 lb

## 2012-02-26 DIAGNOSIS — C3490 Malignant neoplasm of unspecified part of unspecified bronchus or lung: Secondary | ICD-10-CM

## 2012-02-26 DIAGNOSIS — C3410 Malignant neoplasm of upper lobe, unspecified bronchus or lung: Secondary | ICD-10-CM

## 2012-02-26 NOTE — Telephone Encounter (Signed)
Gave pt appt for flush in January 2014 and see MD in March 2014 with lab, Ct

## 2012-02-26 NOTE — Patient Instructions (Addendum)
The scan showed no evidence for disease progression. Followup in 4 months with repeat CT scan of the chest.  

## 2012-02-26 NOTE — Progress Notes (Signed)
[facility] [phone]   [phone]  OFFICE PROGRESS NOTE  [name] [address] [place] [address]  PRINCIPAL DIAGNOSIS: Limited-stage small cell lung cancer diagnosed in April of 2012.   PRIOR THERAPY:  1. Status post systemic chemotherapy with carboplatin and etoposide, last dose was given 09/30/2010. This was concurrent with radiotherapy under the care of Dr. Michell Barker. 2. Status post prophylactic cranial irradiation completed on November 29, 2010.  CURRENT THERAPY: Observation.   INTERVAL HISTORY: [name] 78 y.o. male returns to the clinic today for routine three-month followup visit accompanied by his wife, son and daughter-in-law. The patient is feeling fine today with no specific complaints except for the baseline shortness breath and he is currently on home oxygen. He denied having any significant chest pain, cough or hemoptysis. The patient denied having any significant weight loss or night sweats. He has repeat CT scan of the chest performed recently and he is here for evaluation and discussion of his scan results.  MEDICAL HISTORY: Past Medical History  Diagnosis Date  . Hypertension   . Cancer     small cell lung cancer  . Heart attack   . Emphysema   . Hyperlipidemia   . Allergic rhinitis   . Heart failure   . Cholelithiasis     ALLERGIES:   has no known allergies.  MEDICATIONS:  Current Outpatient Prescriptions  Medication Sig Dispense Refill  . acetaminophen (TYLENOL) 325 MG tablet Take 650 mg by mouth every 6 (six) hours as needed.        Marland Kitchen albuterol (VENTOLIN HFA) 108 (90 BASE) MCG/ACT inhaler Inhale 2 puffs into the lungs every 4 (four) hours as needed for wheezing.  1 Inhaler  5  . aspirin EC 325 MG tablet Take 325 mg by mouth daily.       . calcium acetate (PHOSLO) 667 MG capsule Take 667 mg by mouth.      . clopidogrel (PLAVIX) 75 MG tablet Take 1 tablet by mouth daily.        . feeding supplement (GLUCERNA SHAKE) LIQD Take 237 mLs by mouth 2 (two) times daily before a meal.        . Fluticasone-Salmeterol (ADVAIR DISKUS) 100-50 MCG/DOSE AEPB Inhale 1 puff into the lungs 2 (two) times daily.  60 each  5  . isosorbide mononitrate (IMDUR) 30 MG 24 hr tablet Take 1 tablet by mouth daily.      Marland Kitchen lisinopril-hydrochlorothiazide (PRINZIDE,ZESTORETIC) 10-12.5 MG per tablet Take 1 tablet by mouth daily. Takes 1/2 tablet in AM ONLY      . metoprolol tartrate (LOPRESSOR) 25 MG tablet Take 25 mg by mouth. 12.5mg  BID      . Multiple Vitamin (MULTIVITAMIN) tablet Take 1 tablet by mouth daily.        . simvastatin (ZOCOR) 20 MG tablet Take 20 mg by mouth at bedtime.        Marland Kitchen tiotropium (SPIRIVA HANDIHALER) 18 MCG inhalation capsule Place 1 capsule (18 mcg total) into inhaler and inhale daily.  30 capsule  5  . nitroGLYCERIN (NITROSTAT) 0.4 MG SL tablet Place 0.4 mg under the tongue every 5 (five) minutes as needed.        . [DISCONTINUED] pantoprazole (PROTONIX) 40 MG tablet Take 40 mg by mouth daily.         SURGICAL HISTORY:  Past Surgical History  Procedure Date  . Coronary artery bypass graft   .  Pacemaker insertion   . Portacath placement4/18/12 tip svc     REVIEW OF SYSTEMS:  A comprehensive review of systems was negative except for: Respiratory: positive for dyspnea on exertion   PHYSICAL EXAMINATION: General appearance: alert, cooperative and no distress Head: Normocephalic, without obvious abnormality, atraumatic Neck: no adenopathy Resp: clear to auscultation bilaterally Cardio: regular rate and rhythm, S1, S2 normal, no murmur, click, rub or gallop GI: soft, non-tender; bowel sounds normal; no masses,  no organomegaly Extremities: extremities normal, atraumatic, no cyanosis or edema  ECOG PERFORMANCE STATUS: 1 - Symptomatic but completely ambulatory  Blood pressure 166/80, pulse 83, temperature 97.3 F (36.3 C), temperature source Oral, resp. rate 20, height  5\' 5"  (1.651 m), weight 161 lb (73.029 kg).  LABORATORY DATA: Lab Results  Component Value Date   WBC 5.9 02/22/2012   HGB 13.2 02/22/2012   HCT 39.5 02/22/2012   MCV 92.1 02/22/2012   PLT 115* 02/22/2012      Chemistry      Component Value Date/Time   NA 140 02/22/2012 0956   NA 137 11/21/2011 0945   NA 137 02/22/2011 1411   K 4.0 02/22/2012 0956   K 4.5 11/21/2011 0945   K 3.4* 02/22/2011 1411   CL 103 02/22/2012 0956   CL 96* 11/21/2011 0945   CL 99 02/22/2011 1411   CO2 30* 02/22/2012 0956   CO2 31 11/21/2011 0945   CO2 25 02/22/2011 1411   BUN 22.0 02/22/2012 0956   BUN 18 11/21/2011 0945   BUN 17 02/22/2011 1411   CREATININE 1.0 02/22/2012 0956   CREATININE 1.2 11/21/2011 0945   CREATININE 0.89 02/22/2011 1411      Component Value Date/Time   CALCIUM 10.0 02/22/2012 0956   CALCIUM 9.4 11/21/2011 0945   CALCIUM 9.1 02/22/2011 1411   CALCIUM 7.6* 09/18/2010 1136   ALKPHOS 60 02/22/2012 0956   ALKPHOS 50 11/21/2011 0945   ALKPHOS 70 02/22/2011 1411   AST 18 02/22/2012 0956   AST 24 11/21/2011 0945   AST 13 02/22/2011 1411   ALT 17 02/22/2012 0956   ALT 9 02/22/2011 1411   BILITOT 0.83 02/22/2012 0956   BILITOT 1.10 11/21/2011 0945   BILITOT 0.5 02/22/2011 1411       RADIOGRAPHIC STUDIES: Ct Chest W Contrast  02/22/2012  *RADIOLOGY REPORT*  Clinical Data: Restaging.  History of lung cancer, diagnosed in February 2012. Chemotherapy and radiation therapy completed in September 2012.  CT CHEST WITH CONTRAST  Technique:  Multidetector CT imaging of the chest was performed following the standard protocol during bolus administration of intravenous contrast.  Contrast: 80mL OMNIPAQUE IOHEXOL 300 MG/ML  SOLN, 80mL OMNIPAQUE IOHEXOL 300 MG/ML  SOLN  Comparison: Multiple prior chest CTs, multiple prior chest CTs, most recent 11/21/2011.  PET CT 07/08/2010.  Findings: There are changes of median sternotomy for CABG.  Left chest wall Port-A-Cath is present, with the distal tip of  the catheter in the distal superior vena cava.  Right chest wall dual lead pacemaker terminates in the right atrium and right ventricle. Heart size is normal.  Negative for pleural or pericardial effusion.  No supraclavicular, mediastinal, or hilar lymphadenopathy.  Stable mild circumferential mid esophageal wall thickening. Atherosclerotic calcification of the normal caliber thoracic aorta.  Lung windows demonstrate advance centrilobular emphysema. Architectural distortion, volume loss, and traction bronchiectasis in the paramediastinal left upper lobe is compatible with radiation fibrosis and is stable.  No evidence of recurrent disease in the left upper lobe.  No  pulmonary nodules are identified in either lung.  The trachea mainstem bronchi are patent.  There is no airspace disease or pneumothorax.  There are typical degenerative changes in the thoracic spine.  No fracture or suspicious bony lesion identified.  Previously described tiny low density lesion in the hepatic dome is not as conspicuous on today's study, likely due to differences in timing relative to contrast bolus, but does appear stable in size. No new hepatic lesion is identified.  Stable upper pole right renal cyst.  Stable left adrenal gland thickening, previously characterized as an adenoma.  Atherosclerotic calcification of the visualized proximal abdominal aorta and origin of both renal arteries.  IMPRESSION:  1.  Stable examination.  Radiation changes in the left upper lobe. No evidence of recurrent or metastatic disease in the chest. Underlying advances emphysema.             2.  Stable tiny hepatic dome lesion. 3.  Stable mid esophageal wall thickening.  Esophagitis is suspected. 4.  Stable left adrenal nodularity/thickening.  This probably reflects an adrenal adenoma.   Original Report Authenticated By: Britta Mccreedy, M.D.     ASSESSMENT: This is a very pleasant 79 years old white male with history of limited stage small cell lung cancer  status post systemic chemotherapy with carboplatin and etoposide concurrent with radiation followed by prophylactic cranial irradiation. The patient has been observation since June of 2012 with no evidence for disease recurrence.  PLAN: I discussed the scan results with the patient and his family. I recommended for him to continue on observation for now with repeat CT scan of the chest with contrast in 4 months. The patient was advised to call me immediately if he has any concerning symptoms in the interval.  All questions were answered. The patient knows to call the clinic with any problems, questions or concerns. We can certainly see the patient much sooner if necessary.

## 2012-02-29 ENCOUNTER — Ambulatory Visit: Payer: Medicare Other | Admitting: Radiation Oncology

## 2012-04-01 ENCOUNTER — Ambulatory Visit (INDEPENDENT_AMBULATORY_CARE_PROVIDER_SITE_OTHER): Payer: Medicare Other | Admitting: Emergency Medicine

## 2012-04-01 ENCOUNTER — Encounter: Payer: Self-pay | Admitting: Emergency Medicine

## 2012-04-01 VITALS — BP 120/70 | HR 86 | Temp 97.6°F | Ht 68.0 in | Wt 165.0 lb

## 2012-04-01 DIAGNOSIS — J449 Chronic obstructive pulmonary disease, unspecified: Secondary | ICD-10-CM

## 2012-04-01 NOTE — Patient Instructions (Addendum)
Walking oximetry on 3L/min We will perform full breathing testing before your next visit Please follow with Dr Allyson Sabal and let him know about your shortness of breath. Sometimes heart disease can cause shortness of breath. He may decide you need a stress test.  Follow with Dr [name] as planned Follow with Dr [name] in 1 month

## 2012-04-01 NOTE — Progress Notes (Signed)
Subjective:    Patient ID: [name], male    DOB: [birth year], 78 y.o.   MRN: [id number] HPI 78 yo man, former tobacco 100 pk-yr, CAD s/p CABG and PTCI, hx small cell lung CA in 3/12 s/p chemo and XRT last in 6/12, just completed whole brain radiation. F/u imaging with improvement in the small cell CA, to have repeat CT in October. He presents today with fatigue, exertional dyspnea. Had been on Advair prior to the lung CA. He does not have SABA at this time. He was started on O2, doesn't wear reliably, needs it w exertion.   ROV 12/30/10 -- follow up, hx CAD, tobacco w COPD, small cell lung CA s/p chemo and XRT.  Last time we added Spiriva to Advair - he believes that his breathing is much better. He has not been wearing his his O2 as reliably. He had PFT in April '12. He has some congestion/URI type symptoms. Improved. Still with some UA irritation.   ROV 03/29/11 -- follow up, hx CAD, tobacco w COPD, small cell lung CA s/p chemo and XRT.  He has finished brain XRT. Was admitted to [facility] with severe nausea, GERD, wt loss.  He is doing much better now - planning for CT chest and brain 04/13/10 [name] [place]). He is tolerating Advair and Spiriva. Hoarse voice is better now that GERD is treated. He has not needed SABA.   ROV 06/28/11 -- COPD with SCLCA s/p chemo + XRT with great response. Due for repeat CT scan in April. He has been much more active, has more energy. Breathing well, is not using O2 frequently. He is on Advair, Spiriva. Has SABA available.   ROV 09/21/11 -- COPD with SCLCA s/p chemo + XRT with great response. His wife says that he has not been doing well, was treated for bronchitis in April w abx and resolved. Then had episode hemoptysis, prompted more abx and a break from ASA and plavix. Had CT scan in 4/13 that showed LUL radiation changes.  Has been wearing O2 at all times.   ROV 12/18/11 -- COPD with SCLCA s/p chemo + XRT with great response. Returns for f/u/.  He says that his  breathing has been somewhat worse with exertion and when he is full after a meal. On advair + spiriva. Bothers him in the am. He is skipping/forgetting the spiriva some times. He uses albuterol about 2 -3 x a day.   ROV 04/01/12 -- COPD with SCLCA s/p chemo + XRT with great response. Returns for f/u. He is having much more exertional SOB over the last yr. Using Spiriva and Advair reliably, uses albuterol up to 4x a day. he follows with Dr Allyson Sabal, ? Whether this is a cardiac limitation.    CT chest 04/01/12 --  Comparison: Multiple prior chest CTs, multiple prior chest CTs,  most recent 11/21/2011. PET CT 07/08/2010.  Findings: There are changes of median sternotomy for CABG. Left  chest wall Port-A-Cath is present, with the distal tip of the  catheter in the distal superior vena cava. Right chest wall dual  lead pacemaker terminates in the right atrium and right ventricle.  Heart size is normal.  Negative for pleural or pericardial effusion. No supraclavicular,  mediastinal, or hilar lymphadenopathy.  Stable mild circumferential mid esophageal wall thickening.  Atherosclerotic calcification of the normal caliber thoracic aorta.  Lung windows demonstrate advance centrilobular emphysema.  Architectural distortion, volume loss, and traction bronchiectasis  in the paramediastinal left upper  lobe is compatible with radiation  fibrosis and is stable. No evidence of recurrent disease in the  left upper lobe. No pulmonary nodules are identified in either  lung. The trachea mainstem bronchi are patent. There is no  airspace disease or pneumothorax.  There are typical degenerative changes in the thoracic spine. No  fracture or suspicious bony lesion identified.  Previously described tiny low density lesion in the hepatic dome is  not as conspicuous on today's study, likely due to differences in  timing relative to contrast bolus, but does appear stable in size.  No new hepatic lesion is identified.  Stable upper pole right renal  cyst. Stable left adrenal gland thickening, previously  characterized as an adenoma. Atherosclerotic calcification of the  visualized proximal abdominal aorta and origin of both renal  arteries.  IMPRESSION:  1. Stable examination. Radiation changes in the left upper lobe.  No evidence of recurrent or metastatic disease in the chest.  Underlying advances emphysema. 2. Stable tiny hepatic  dome lesion.  3. Stable mid esophageal wall thickening. Esophagitis is  suspected.  4. Stable left adrenal nodularity/thickening. This probably  reflects an adrenal adenoma.    Objective:   Physical Exam Filed Vitals:   04/01/12 1600  BP: 120/70  Pulse: 86  Temp: 97.6 F (36.4 C)   Gen: Pleasant, well-nourished, in no distress,  normal affect  ENT: No lesions,  mouth clear,  oropharynx clear, no postnasal drip, mild hoarse voice  Neck: No JVD, no TMG, no carotid bruits  Lungs: No use of accessory muscles, very distant, no wheeze  Cardiovascular: RRR, heart sounds normal, no murmur or gallops, no peripheral edema  Musculoskeletal: No deformities, no cyanosis or clubbing  Neuro: alert, non focal  Skin: Warm, no lesions or rashes     Assessment & Plan:  COPD (chronic obstructive pulmonary disease) With worsening SOB. ? Progression of his COPD vs cardiac limitation in man with 78 yo CABG's - same BD's - full PFt to compare w priors - will ask Dr Allyson Sabal for input about possible cardiac stress testing - rov 1 mon

## 2012-04-01 NOTE — Assessment & Plan Note (Addendum)
With worsening SOB. ? Progression of his COPD vs cardiac limitation in man with 78 yo CABG's - same BD's - full PFt to compare w priors - will ask Dr Allyson Sabal for input about possible cardiac stress testing - rov 1 mon

## 2012-04-08 IMAGING — CT CT CHEST W/ CM
2 of 3 series · 15 of 36 positions shown, 18 images · IV contrast (80ML OMNI 300)
Comparison: 10/13/2010

CLINICAL DATA: Lung cancer diagnosed 8778, prior chemotherapy and
XRT, emphysema

CT CHEST WITH CONTRAST
TECHNIQUE: Multidetector CT imaging of the chest was performed
following the standard protocol during bolus administration of
intravenous contrast.
Contrast: 80mL OMNIPAQUE IOHEXOL 300 MG/ML IV SOLN

[Series 2: chest with st · axial · 0.70mm/px · z∈[-292,-7]mm · 12 of 67 slices shown, 15 images]
[im 5/67  mediastinal]
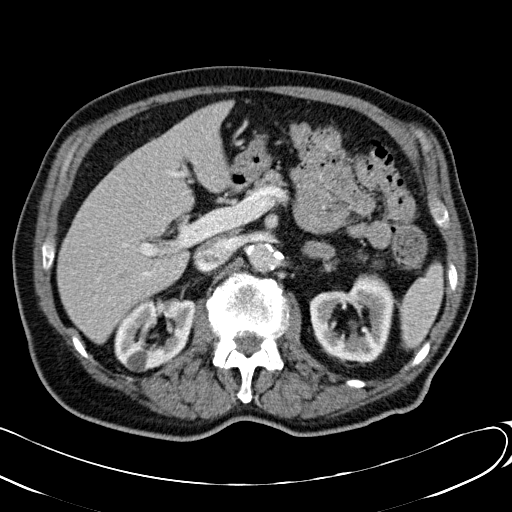
[im 5/67  lung]
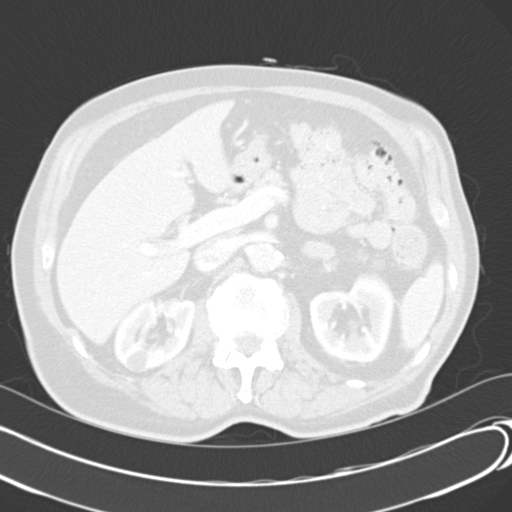
[im 10/67  lung]
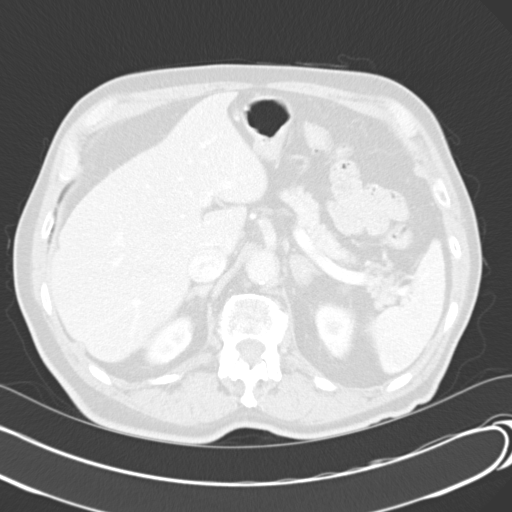
[im 15/67  lung]
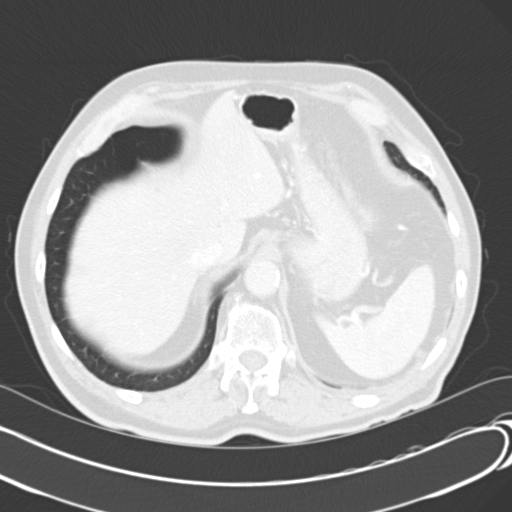
[im 20/67  lung]
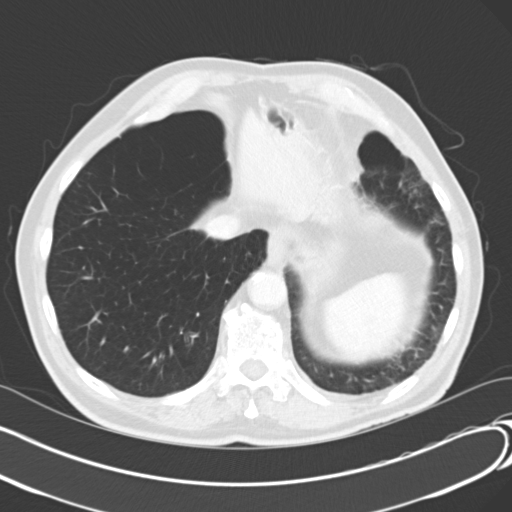
[im 25/67  mediastinal]
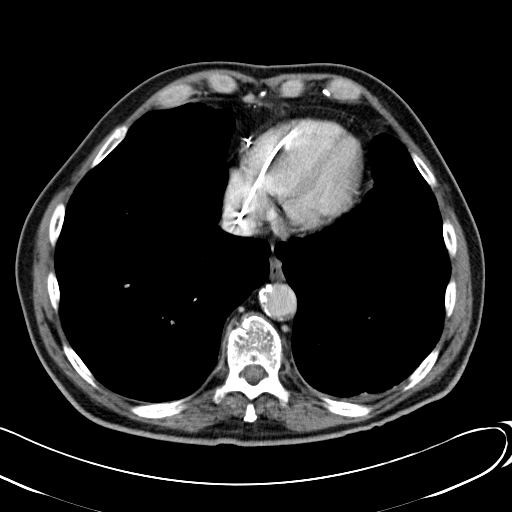
[im 25/67  lung]
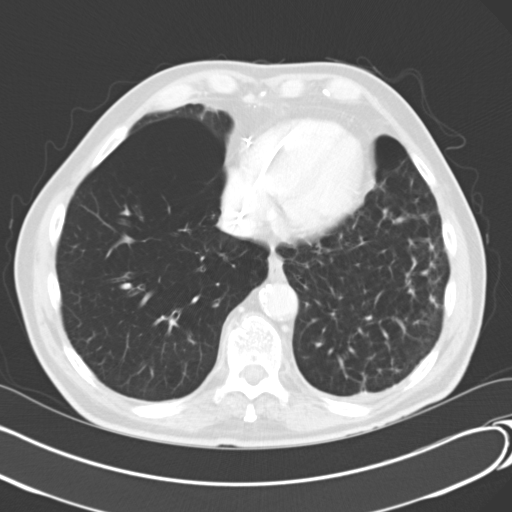
[im 30/67  lung]
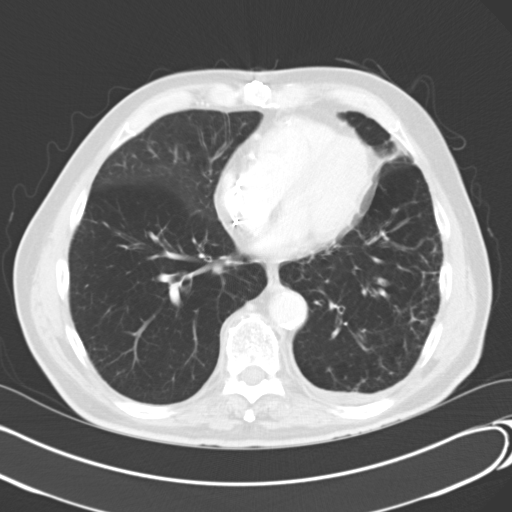
[im 37/67  lung]
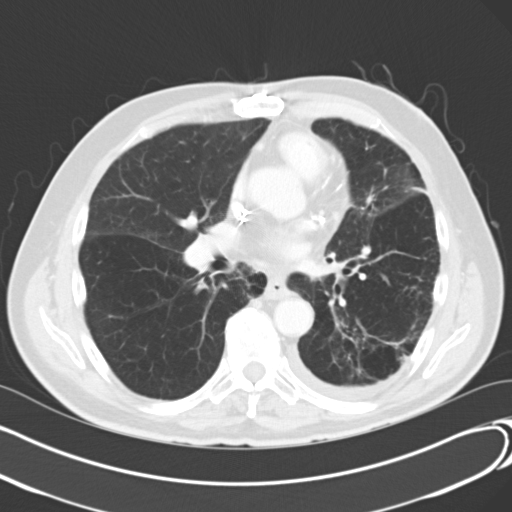
[im 42/67  lung]
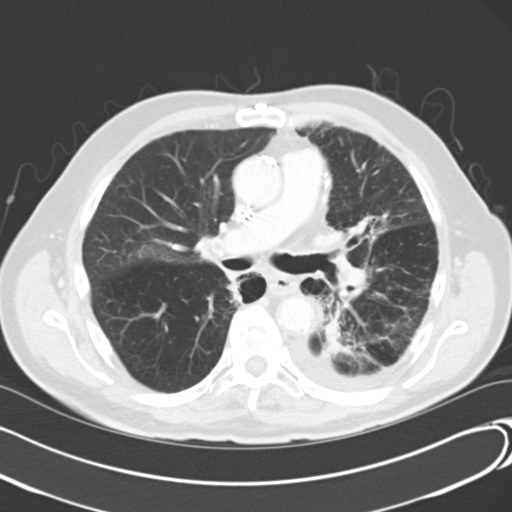
[im 47/67  mediastinal]
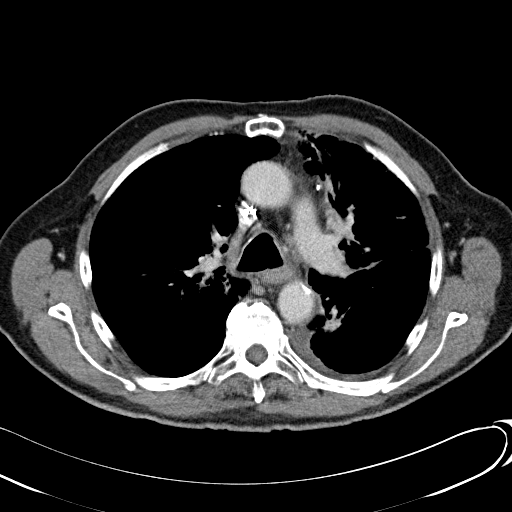
[im 47/67  lung]
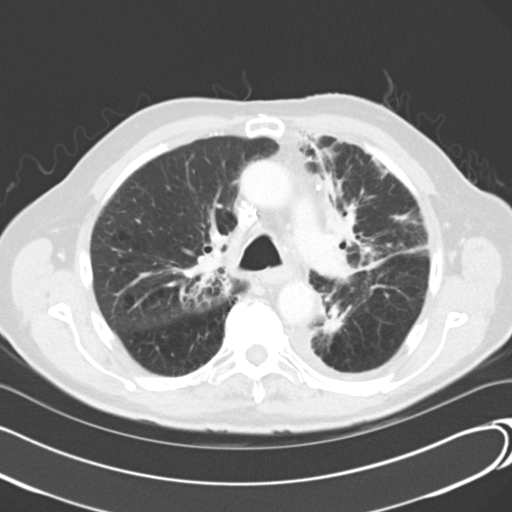
[im 52/67  lung]
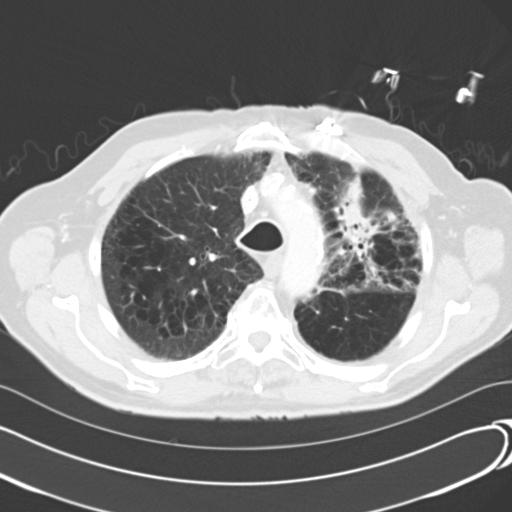
[im 57/67  lung]
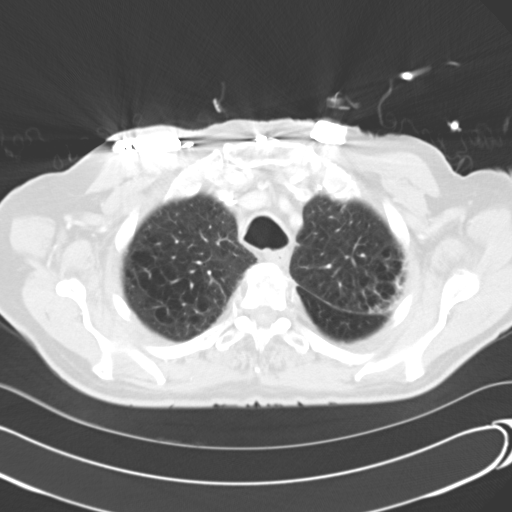
[im 62/67  lung]
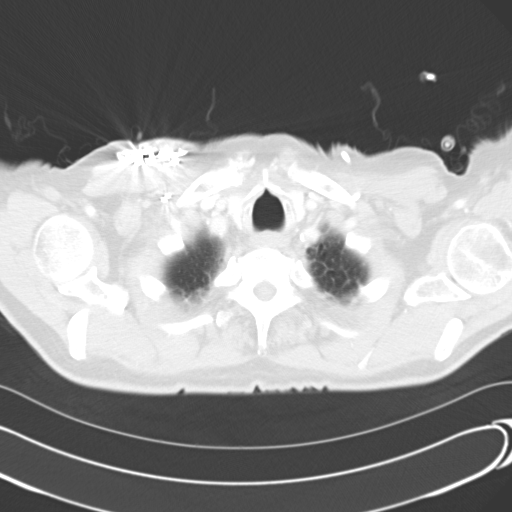

[Series 602: <mpr thick range> · coronal · 0.70mm/px · 3 of 83 slices shown]
[im 17/83  lung]
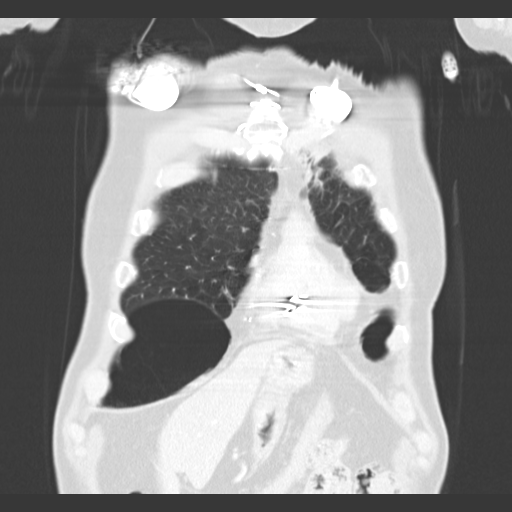
[im 33/83  lung]
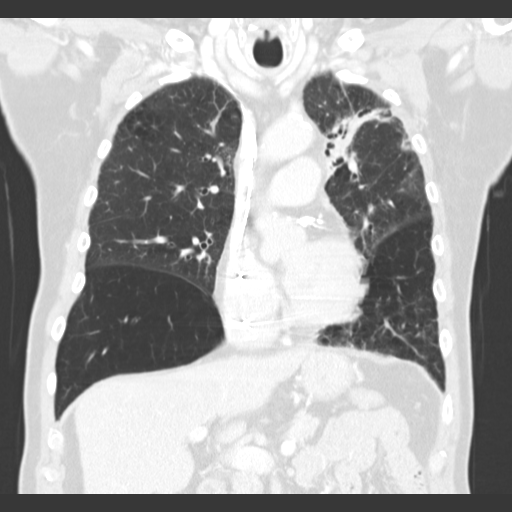
[im 50/83  lung]
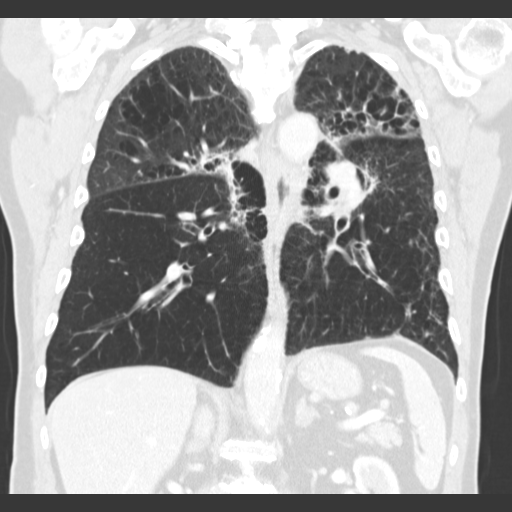

[15 of 36 positions shown; findings below may reference images not displayed]

FINDINGS: Left upper lobe/paramediastinal radiation changes.  Prior
left upper lobe pulmonary nodule is not visualized but may be
obscured by fibrosis.  Extensive centrilobular emphysematous
changes. No pleural effusion or pneumothorax.

Visualized thyroid is unremarkable.

The heart is normal in size.  No pericardial effusion.  Coronary
atherosclerosis. Postsurgical changes related to prior CABG.
Atherosclerotic calcifications of the aortic arch.

The left chest port terminating in the lower SVC.  Right subclavian
pacemaker.

Small mediastinal lymph nodes which are not pathologically
enlarged.  No suspicious hilar or axillary lymphadenopathy.

Visualized upper abdomen is notable for a right upper pole cyst and
stable probable left adrenal adenoma.

Degenerative changes of the visualized thoracolumbar spine.  No
focal osseous lesions.
IMPRESSION: Left upper lobe/paramediastinal radiation changes.

Prior left upper lobe nodule is not visualized but may be obscured
by fibrosis. Otherwise, no evidence of recurrent/metastatic disease
in the chest.

Extensive centrilobular emphysematous changes.

## 2012-04-12 ENCOUNTER — Other Ambulatory Visit (HOSPITAL_COMMUNITY): Payer: Self-pay | Admitting: Cardiovascular Disease

## 2012-04-12 DIAGNOSIS — I2510 Atherosclerotic heart disease of native coronary artery without angina pectoris: Secondary | ICD-10-CM

## 2012-04-12 DIAGNOSIS — R0602 Shortness of breath: Secondary | ICD-10-CM

## 2012-04-16 ENCOUNTER — Telehealth: Payer: Self-pay | Admitting: Emergency Medicine

## 2012-04-16 NOTE — Telephone Encounter (Signed)
Per Bo Merino she needs pt's qualifying sats on RA. She is aware pt was walked when here on 04/01/12 but this was done on oxygen. The qualifying sat for RA is from 06/28/11 and the pt dropped to 88%. Per Bo Merino, this is what she is needing and she will be faxing over a CMN for RB to sign. Nothing further is needed.

## 2012-04-16 NOTE — Telephone Encounter (Signed)
LMTCB

## 2012-04-19 ENCOUNTER — Telehealth: Payer: Self-pay | Admitting: Emergency Medicine

## 2012-04-19 NOTE — Telephone Encounter (Signed)
No need for message. °

## 2012-04-22 ENCOUNTER — Ambulatory Visit (HOSPITAL_BASED_OUTPATIENT_CLINIC_OR_DEPARTMENT_OTHER): Payer: Medicare Other

## 2012-04-22 VITALS — BP 115/68 | HR 75 | Temp 96.8°F

## 2012-04-22 DIAGNOSIS — Z452 Encounter for adjustment and management of vascular access device: Secondary | ICD-10-CM

## 2012-04-22 DIAGNOSIS — C3410 Malignant neoplasm of upper lobe, unspecified bronchus or lung: Secondary | ICD-10-CM

## 2012-04-22 DIAGNOSIS — C3490 Malignant neoplasm of unspecified part of unspecified bronchus or lung: Secondary | ICD-10-CM

## 2012-04-22 MED ORDER — SODIUM CHLORIDE 0.9 % IJ SOLN
10.0000 mL | INTRAMUSCULAR | Status: DC | PRN
  Administered 2012-04-22: 10 mL via INTRAVENOUS
  Filled 2012-04-22: qty 10

## 2012-04-22 MED ORDER — HEPARIN SOD (PORK) LOCK FLUSH 100 UNIT/ML IV SOLN
500.0000 [IU] | Freq: Once | INTRAVENOUS | Status: AC
  Administered 2012-04-22: 500 [IU] via INTRAVENOUS
  Filled 2012-04-22: qty 5

## 2012-04-24 ENCOUNTER — Ambulatory Visit (HOSPITAL_COMMUNITY)
Admission: RE | Admit: 2012-04-24 | Discharge: 2012-04-24 | Disposition: A | Discharge status: Home / Self Care | Payer: Medicare Other | Source: Ambulatory Visit | Attending: Cardiovascular Disease | Admitting: Cardiovascular Disease

## 2012-04-24 DIAGNOSIS — I10 Essential (primary) hypertension: Secondary | ICD-10-CM | POA: Insufficient documentation

## 2012-04-24 DIAGNOSIS — I509 Heart failure, unspecified: Secondary | ICD-10-CM | POA: Insufficient documentation

## 2012-04-24 DIAGNOSIS — J449 Chronic obstructive pulmonary disease, unspecified: Secondary | ICD-10-CM | POA: Insufficient documentation

## 2012-04-24 DIAGNOSIS — J4489 Other specified chronic obstructive pulmonary disease: Secondary | ICD-10-CM | POA: Insufficient documentation

## 2012-04-24 DIAGNOSIS — R0602 Shortness of breath: Secondary | ICD-10-CM

## 2012-04-24 DIAGNOSIS — I2510 Atherosclerotic heart disease of native coronary artery without angina pectoris: Secondary | ICD-10-CM | POA: Insufficient documentation

## 2012-04-24 MED ORDER — TECHNETIUM TC 99M SESTAMIBI GENERIC - CARDIOLITE
10.8000 | Freq: Once | INTRAVENOUS | Status: AC | PRN
  Administered 2012-04-24: 11 via INTRAVENOUS

## 2012-04-24 MED ORDER — SODIUM CHLORIDE 0.9 % IV SOLN
30.0000 ug/kg | INTRAVENOUS | Status: DC
  Administered 2012-04-24: 11:00:00 via INTRAVENOUS

## 2012-04-24 MED ORDER — TECHNETIUM TC 99M SESTAMIBI GENERIC - CARDIOLITE
31.6000 | Freq: Once | INTRAVENOUS | Status: AC | PRN
  Administered 2012-04-24: 32 via INTRAVENOUS

## 2012-04-24 MED ORDER — SODIUM CHLORIDE 0.9 % IV SOLN
30.0000 ug/kg | Freq: Once | INTRAVENOUS | Status: AC
  Administered 2012-04-24: 30 ug/kg/min via INTRAVENOUS

## 2012-04-24 NOTE — Progress Notes (Signed)
[facility]   2D echo completed 04/24/2012.   Cindy Porchea Charrier, RDCS   

## 2012-04-24 NOTE — Procedures (Addendum)
[name] [address] [place] [address] [place] [address] [phone]  Cardiology Nuclear Med Study  BARRIE WALE is a 79 y.o. male     MRN : [id number]     DOB: [birth year]  Procedure Date: 04/24/2012  Nuclear Med Background Indication for Stress Test:  Evaluation for Ischemia History:  CAD, CHF, COPD, Pacer Cardiac Risk Factors: Family History - CAD, History of Smoking, Hypertension and Lipids  Symptoms:  SOB   Nuclear Pre-Procedure Caffeine/Decaff Intake:  10:00pm NPO After: 8:00am   IV Site: R Antecubital  IV 0.9% NS with Angio Cath:  22g  Chest Size (in):  42 IV Started by: Koren Shiver, CNMT  Height: 5\' 8"  (1.727 m)  Cup Size: n/a  BMI:  Body mass index is 25.09 kg/(m^2). Weight:  165 lb (74.844 kg)   Tech Comments:  n/a    Nuclear Med Study 1 or 2 day study: 1 day  Stress Test Type:  Dobutamine  Order Authorizing Provider:  Nanetta Batty, MD   Resting Radionuclide: Technetium 84m Sestamibi  Resting Radionuclide Dose: 10.8 mCi   Stress Radionuclide:  Technetium 53m Sestamibi  Stress Radionuclide Dose: 31.6 mCi           Stress Protocol Rest HR:80 Stress HR:134  Rest BP: *143/88 Stress BP:160/69  Exercise Time (min): n/a METS: n/a          Dose of Adenosine (mg):  n/a Dose of Lexiscan: n/a mg  Dose of Atropine (mg): n/a Dose of Dobutamine: 12 mcg/kg/min (at max HR)  Stress Test Technologist: Ernestene Mention, CCT Nuclear Technologist: Koren Shiver, CNMT   Rest Procedure:  Myocardial perfusion imaging was performed at rest 45 minutes following the intravenous administration of Technetium 74m Sestamibi. Stress Procedure:  The patient received IV dobutamine and no IV atropine.  There were no significant changes with infusion.  Technetium 57m Sestamibi was injected at peak heart rate and quantitative spect images were obtained after a 45 minute delay.  Transient Ischemic Dilatation (Normal <1.22):  1.24 Lung/Heart  Ratio (Normal <0.45):  0.36 QGS EDV:  58 ml QGS ESV:  27 ml LV Ejection Fraction: 53%  Signed by:  Mickel Duhamel Moffitt, CNMT  PHYSICIAN INTERPRETATION:  Rest ECG: NSR with non-specific ST-T wave changes;  Overall low voltage  Stress ECG: No significant change from baseline ECG, There are scattered PVCs. and There are scattered PACs.  QPS Raw Data Images:  There is interference from nuclear activity from structures below the diaphragm. This does not affect the ability to read the study. Stress Images:  There is decreased uptake in the septum - both anteroseptal and inferospetal; There is decreased uptake in the apex.;There is decreased uptake in the basal inferior wall. Rest Images:  Comparison with the stress images reveals no significant change. Subtraction (SDS):  There is a moderate sized, moderate to severe intensity fixed defect in the basal to mid Septal walls that is most consistent with a previous infarction. No evidence of ischemia.  Impression Exercise Capacity:  Dobutamine study with no exercise. BP Response:  Normal blood pressure response. Clinical Symptoms:  Mild chest pain/dyspnea. noted more during recovery ECG Impression:  No significant ST segment change suggestive of ischemia. LV Wall Motion:  Normal EF, Septal and mid to basal inferior moderate hypokinesis with abnormal thickening - consistent with prior infarction. Normal to small LV size. Comparison with Prior Nuclear Study: The fixed defect is more prominent, but no notable ischemia present.  Overall  Impression:  Low risk stress nuclear study. No evidence of ischemia, however the fixed perfusion defect is more prominent.    Marykay Lex, MD  04/24/2012 1:55 PM

## 2012-05-01 ENCOUNTER — Encounter (INDEPENDENT_AMBULATORY_CARE_PROVIDER_SITE_OTHER): Payer: Medicare Other | Admitting: Ophthalmology

## 2012-05-02 ENCOUNTER — Ambulatory Visit: Payer: Medicare Other | Admitting: Emergency Medicine

## 2012-05-02 ENCOUNTER — Encounter (INDEPENDENT_AMBULATORY_CARE_PROVIDER_SITE_OTHER): Payer: Medicare Other | Admitting: Ophthalmology

## 2012-05-02 DIAGNOSIS — I10 Essential (primary) hypertension: Secondary | ICD-10-CM

## 2012-05-02 DIAGNOSIS — D3130 Benign neoplasm of unspecified choroid: Secondary | ICD-10-CM

## 2012-05-02 DIAGNOSIS — E11319 Type 2 diabetes mellitus with unspecified diabetic retinopathy without macular edema: Secondary | ICD-10-CM

## 2012-05-02 DIAGNOSIS — H43819 Vitreous degeneration, unspecified eye: Secondary | ICD-10-CM

## 2012-05-02 DIAGNOSIS — E1139 Type 2 diabetes mellitus with other diabetic ophthalmic complication: Secondary | ICD-10-CM

## 2012-05-02 DIAGNOSIS — H3530 Unspecified macular degeneration: Secondary | ICD-10-CM

## 2012-05-02 DIAGNOSIS — H35039 Hypertensive retinopathy, unspecified eye: Secondary | ICD-10-CM

## 2012-05-08 ENCOUNTER — Telehealth: Payer: Self-pay | Admitting: Emergency Medicine

## 2012-05-08 NOTE — Telephone Encounter (Signed)
I spoke with spouse. She is wanting to make sure we have the tests ordered for pt before appt on friday: echo and myocardial perfusion. This is in EPIC. Nothing further was needed

## 2012-05-10 ENCOUNTER — Ambulatory Visit (INDEPENDENT_AMBULATORY_CARE_PROVIDER_SITE_OTHER): Payer: Medicare Other | Admitting: Emergency Medicine

## 2012-05-10 ENCOUNTER — Encounter: Payer: Self-pay | Admitting: Emergency Medicine

## 2012-05-10 VITALS — BP 122/66 | HR 74 | Temp 96.9°F | Ht 68.0 in | Wt 167.0 lb

## 2012-05-10 DIAGNOSIS — C3490 Malignant neoplasm of unspecified part of unspecified bronchus or lung: Secondary | ICD-10-CM

## 2012-05-10 DIAGNOSIS — J449 Chronic obstructive pulmonary disease, unspecified: Secondary | ICD-10-CM

## 2012-05-10 LAB — PULMONARY FUNCTION TEST

## 2012-05-10 NOTE — Assessment & Plan Note (Signed)
Very severe AFL. Gold C sx.  - continue Advair and Spiriva, SABA prn - O2 3-4L/min - rov 3 months

## 2012-05-10 NOTE — Assessment & Plan Note (Signed)
Repeat Ct scan 06/21/12

## 2012-05-10 NOTE — Patient Instructions (Addendum)
Please continue your Advair and Spiriva  Use albuterol as needed Wear your oxygen at 3L/min at rest and 4L/min with exertion Follow with Dr [name] in 3 months or sooner if you have any problems.

## 2012-05-10 NOTE — Progress Notes (Signed)
Subjective:    Patient ID: [name], male    DOB: 12/25/1930, 79 y.o.   MRN: [id number] HPI 79 yo man, former tobacco 100 pk-yr, CAD s/p CABG and PTCI, hx small cell lung CA in 3/12 s/p chemo and XRT last in 6/12, just completed whole brain radiation. F/u imaging with improvement in the small cell CA, to have repeat CT in October. He presents today with fatigue, exertional dyspnea. Had been on Advair prior to the lung CA. He does not have SABA at this time. He was started on O2, doesn't wear reliably, needs it w exertion.   ROV 12/30/10 -- follow up, hx CAD, tobacco w COPD, small cell lung CA s/p chemo and XRT.  Last time we added Spiriva to Advair - he believes that his breathing is much better. He has not been wearing his his O2 as reliably. He had PFT in April '12. He has some congestion/URI type symptoms. Improved. Still with some UA irritation.   ROV 03/29/11 -- follow up, hx CAD, tobacco w COPD, small cell lung CA s/p chemo and XRT.  He has finished brain XRT. Was admitted to [facility] with severe nausea, GERD, wt loss.  He is doing much better now - planning for CT chest and brain 04/13/10 [name] [place]). He is tolerating Advair and Spiriva. Hoarse voice is better now that GERD is treated. He has not needed SABA.   ROV 06/28/11 -- COPD with SCLCA s/p chemo + XRT with great response. Due for repeat CT scan in April. He has been much more active, has more energy. Breathing well, is not using O2 frequently. He is on Advair, Spiriva. Has SABA available.   ROV 09/21/11 -- COPD with SCLCA s/p chemo + XRT with great response. His wife says that he has not been doing well, was treated for bronchitis in April w abx and resolved. Then had episode hemoptysis, prompted more abx and a break from ASA and plavix. Had CT scan in 4/13 that showed LUL radiation changes.  Has been wearing O2 at all times.   ROV 12/18/11 -- COPD with SCLCA s/p chemo + XRT with great response. Returns for f/u/.  He says that his  breathing has been somewhat worse with exertion and when he is full after a meal. On advair + spiriva. Bothers him in the am. He is skipping/forgetting the spiriva some times. He uses albuterol about 2 -3 x a day.   ROV 04/01/12 -- COPD with SCLCA s/p chemo + XRT with great response. Returns for f/u. He is having much more exertional SOB over the last yr. Using Spiriva and Advair reliably, uses albuterol up to 4x a day. he follows with Dr Allyson Sabal, ? Whether this is a cardiac limitation.   ROV 05/10/12 -- COPD with SCLCA s/p chemo + XRT with great response. Returns today after PFT to characterize worsening exertional SOB. He also had a low risk perfusion scan 04/24/12 that showed a fixed perfusion defect. His AFL is very severely reduced, DLCO low.  Last time we increased o2 to 4L/min w exertion, 3L/min at rest. He believes this has helped him.    PULMONARY FUNCTON TEST 05/10/2012  FVC 2.14  FEV1 .81  FEV1/FVC 37.9  FVC  % Predicted 56  FEV % Predicted 33  FeF 25-75 .26  FeF 25-75 % Predicted 2.15   TTE 04/24/12 --  Study Conclusions - Left ventricle: The cavity size was normal. Wall thickness was increased in a pattern of  mild LVH. There was mild concentric hypertrophy. Systolic function was normal. The estimated ejection fraction was in the range of 55% to 60%. Wall motion was normal; there were no regional wall motion abnormalities. Doppler parameters are consistent with abnormal left ventricular relaxation (grade 1 diastolic dysfunction). - Mitral valve: Mild regurgitation. Valve area by pressure half-time: 2.06cm^2. - Right ventricle: Systolic pressure was increased. - Atrial septum: No defect or patent foramen ovale was identified. - Tricuspid valve: Mild-moderate regurgitation. - Pulmonary arteries: PA peak pressure: 42mm Hg (S). Impressions: - The right ventricular systolic pressure was increased consistent with moderate pulmonary hypertension.     Objective:   Physical  Exam Filed Vitals:   05/10/12 1154  BP: 122/66  Pulse: 74  Temp: 96.9 F (36.1 C)   Gen: Pleasant, well-nourished, in no distress,  normal affect  ENT: No lesions,  mouth clear,  oropharynx clear, no postnasal drip, mild hoarse voice  Neck: No JVD, no TMG, no carotid bruits  Lungs: No use of accessory muscles, very distant, no wheeze  Cardiovascular: RRR, heart sounds normal, no murmur or gallops, no peripheral edema  Musculoskeletal: No deformities, no cyanosis or clubbing  Neuro: alert, non focal  Skin: Warm, no lesions or rashes     Assessment & Plan:  COPD (chronic obstructive pulmonary disease) Very severe AFL. Gold C sx.  - continue Advair and Spiriva, SABA prn - O2 3-4L/min - rov 3 months  Small cell lung cancer Repeat Ct scan 06/21/12

## 2012-05-10 NOTE — Progress Notes (Signed)
PFT done today. 

## 2012-05-20 ENCOUNTER — Encounter: Payer: Self-pay | Admitting: Critical Care Medicine

## 2012-06-21 ENCOUNTER — Ambulatory Visit (HOSPITAL_COMMUNITY)
Admission: RE | Admit: 2012-06-21 | Discharge: 2012-06-21 | Disposition: A | Discharge status: Home / Self Care | Payer: Medicare Other | Source: Ambulatory Visit | Attending: Internal Medicine | Admitting: Internal Medicine

## 2012-06-21 ENCOUNTER — Ambulatory Visit (HOSPITAL_BASED_OUTPATIENT_CLINIC_OR_DEPARTMENT_OTHER): Payer: Medicare Other

## 2012-06-21 ENCOUNTER — Other Ambulatory Visit (HOSPITAL_BASED_OUTPATIENT_CLINIC_OR_DEPARTMENT_OTHER): Payer: Medicare Other | Admitting: Lab

## 2012-06-21 ENCOUNTER — Other Ambulatory Visit: Payer: Self-pay | Admitting: Internal Medicine

## 2012-06-21 VITALS — BP 121/67 | HR 76 | Temp 97.3°F | Resp 24

## 2012-06-21 DIAGNOSIS — Z951 Presence of aortocoronary bypass graft: Secondary | ICD-10-CM | POA: Insufficient documentation

## 2012-06-21 DIAGNOSIS — C3410 Malignant neoplasm of upper lobe, unspecified bronchus or lung: Secondary | ICD-10-CM

## 2012-06-21 DIAGNOSIS — K7689 Other specified diseases of liver: Secondary | ICD-10-CM | POA: Insufficient documentation

## 2012-06-21 DIAGNOSIS — C3490 Malignant neoplasm of unspecified part of unspecified bronchus or lung: Secondary | ICD-10-CM

## 2012-06-21 DIAGNOSIS — I2510 Atherosclerotic heart disease of native coronary artery without angina pectoris: Secondary | ICD-10-CM | POA: Insufficient documentation

## 2012-06-21 DIAGNOSIS — R911 Solitary pulmonary nodule: Secondary | ICD-10-CM | POA: Insufficient documentation

## 2012-06-21 LAB — COMPREHENSIVE METABOLIC PANEL (CC13)
Albumin: 3.9 g/dL (ref 3.5–5.0)
Alkaline Phosphatase: 65 U/L (ref 40–150)
BUN: 24.4 mg/dL (ref 7.0–26.0)
Calcium: 9.3 mg/dL (ref 8.4–10.4)
Glucose: 102 mg/dl — ABNORMAL HIGH (ref 70–99)
Potassium: 3.8 mEq/L (ref 3.5–5.1)

## 2012-06-21 LAB — CBC WITH DIFFERENTIAL/PLATELET
Basophils Absolute: 0 10*3/uL (ref 0.0–0.1)
Eosinophils Absolute: 0.3 10*3/uL (ref 0.0–0.5)
HGB: 13.3 g/dL (ref 13.0–17.1)
MCV: 90.2 fL (ref 79.3–98.0)
MONO#: 0.6 10*3/uL (ref 0.1–0.9)
NEUT#: 5.6 10*3/uL (ref 1.5–6.5)
RDW: 13.5 % (ref 11.0–14.6)
lymph#: 0.8 10*3/uL — ABNORMAL LOW (ref 0.9–3.3)

## 2012-06-21 MED ORDER — SODIUM CHLORIDE 0.9 % IJ SOLN
10.0000 mL | INTRAMUSCULAR | Status: DC | PRN
  Administered 2012-06-21: 10 mL via INTRAVENOUS
  Filled 2012-06-21: qty 10

## 2012-06-21 MED ORDER — IOHEXOL 300 MG/ML  SOLN
80.0000 mL | Freq: Once | INTRAMUSCULAR | Status: AC | PRN
  Administered 2012-06-21: 80 mL via INTRAVENOUS

## 2012-06-21 MED ORDER — HEPARIN SOD (PORK) LOCK FLUSH 100 UNIT/ML IV SOLN
500.0000 [IU] | Freq: Once | INTRAVENOUS | Status: AC
  Administered 2012-06-21: 500 [IU] via INTRAVENOUS
  Filled 2012-06-21: qty 5

## 2012-06-21 NOTE — Progress Notes (Signed)
Patient for CT scan. Port left accessed with power loc.

## 2012-06-21 NOTE — Patient Instructions (Signed)
Call MD for problems or concerns 

## 2012-06-25 ENCOUNTER — Telehealth: Payer: Self-pay | Admitting: Internal Medicine

## 2012-06-25 ENCOUNTER — Encounter: Payer: Self-pay | Admitting: Internal Medicine

## 2012-06-25 ENCOUNTER — Ambulatory Visit (HOSPITAL_BASED_OUTPATIENT_CLINIC_OR_DEPARTMENT_OTHER): Payer: Medicare Other | Admitting: Internal Medicine

## 2012-06-25 VITALS — BP 137/74 | HR 69 | Temp 97.6°F | Resp 20 | Ht 68.0 in | Wt 157.6 lb

## 2012-06-25 DIAGNOSIS — C3410 Malignant neoplasm of upper lobe, unspecified bronchus or lung: Secondary | ICD-10-CM

## 2012-06-25 DIAGNOSIS — C3492 Malignant neoplasm of unspecified part of left bronchus or lung: Secondary | ICD-10-CM

## 2012-06-25 NOTE — Progress Notes (Signed)
[facility] [phone]   [phone]  OFFICE PROGRESS NOTE  Barker, Keith Ammons, MD [address] [place] [address]  PRINCIPAL DIAGNOSIS: Limited-stage small cell lung cancer diagnosed in April of 2012.   PRIOR THERAPY:  1. Status post systemic chemotherapy with carboplatin and etoposide, last dose was given 09/30/2010. This was concurrent with radiotherapy under the care of Dr. [name]. 2. Status post prophylactic cranial irradiation completed on November 29, 2010.  CURRENT THERAPY: Observation.  INTERVAL HISTORY: [name] 79 y.o. male returns to the clinic today for followup visit accompanied by his wife Darel Hong and his son. The patient is doing fine today except for the baseline shortness of breath and he is currently on home oxygen ranging between 2.5-4 L depending on the activity. He is followed closely by Dr. [name] for his COPD. The patient denied having any significant chest pain, cough or hemoptysis. He denied having any significant weight loss or night sweats. He had repeat CT scan of the chest performed recently and he is here for evaluation and discussion of his scan results.  MEDICAL HISTORY: Past Medical History  Diagnosis Date  . Hypertension   . Cancer     small cell lung cancer  . Heart attack   . Emphysema   . Hyperlipidemia   . Allergic rhinitis   . Heart failure   . Cholelithiasis     ALLERGIES:  has No Known Allergies.  MEDICATIONS:  Current Outpatient Prescriptions  Medication Sig Dispense Refill  . acetaminophen (TYLENOL) 325 MG tablet Take 650 mg by mouth every 6 (six) hours as needed.        Marland Kitchen albuterol (VENTOLIN HFA) 108 (90 BASE) MCG/ACT inhaler Inhale 2 puffs into the lungs every 4 (four) hours as needed for wheezing.  1 Inhaler  5  . aspirin EC 325 MG tablet Take 325 mg by mouth daily.       . calcium acetate (PHOSLO) 667 MG capsule Take 667 mg by mouth.      . clopidogrel (PLAVIX) 75 MG  tablet Take 1 tablet by mouth daily.      . feeding supplement (GLUCERNA SHAKE) LIQD Take 237 mLs by mouth 2 (two) times daily before a meal.        . Fluticasone-Salmeterol (ADVAIR DISKUS) 100-50 MCG/DOSE AEPB Inhale 1 puff into the lungs 2 (two) times daily.  60 each  5  . isosorbide mononitrate (IMDUR) 30 MG 24 hr tablet Take 1 tablet by mouth daily.      Marland Kitchen lisinopril-hydrochlorothiazide (PRINZIDE,ZESTORETIC) 10-12.5 MG per tablet Take 1 tablet by mouth daily. Takes 1 1/2 tablets in AM ONLY      . metoprolol tartrate (LOPRESSOR) 25 MG tablet Take 25 mg by mouth. 12.5mg  BID      . Multiple Vitamin (MULTIVITAMIN) tablet Take 1 tablet by mouth daily.        . nitroGLYCERIN (NITROSTAT) 0.4 MG SL tablet Place 0.4 mg under the tongue every 5 (five) minutes as needed.        . simvastatin (ZOCOR) 20 MG tablet Take 20 mg by mouth at bedtime.        Marland Kitchen tiotropium (SPIRIVA HANDIHALER) 18 MCG inhalation capsule Place 1 capsule (18 mcg total) into inhaler and inhale daily.  30 capsule  5  . [DISCONTINUED] pantoprazole (PROTONIX) 40 MG tablet Take 40 mg by mouth daily.        No current facility-administered medications for  this visit.    SURGICAL HISTORY:  Past Surgical History  Procedure Laterality Date  . Coronary artery bypass graft    . Pacemaker insertion    . Portacath placement4/18/12 tip svc      REVIEW OF SYSTEMS:  A comprehensive review of systems was negative except for: Respiratory: positive for dyspnea on exertion   PHYSICAL EXAMINATION: General appearance: alert, cooperative and no distress Head: Normocephalic, without obvious abnormality, atraumatic Neck: no adenopathy Resp: wheezes bilaterally Cardio: regular rate and rhythm, S1, S2 normal, no murmur, click, rub or gallop GI: soft, non-tender; bowel sounds normal; no masses,  no organomegaly Extremities: extremities normal, atraumatic, no cyanosis or edema  ECOG PERFORMANCE STATUS: 1 - Symptomatic but completely  ambulatory  There were no vitals taken for this visit.  LABORATORY DATA: Lab Results  Component Value Date   WBC 7.4 06/21/2012   HGB 13.3 06/21/2012   HCT 39.7 06/21/2012   MCV 90.2 06/21/2012   PLT 108* 06/21/2012      Chemistry      Component Value Date/Time   NA 141 06/21/2012 0912   NA 137 11/21/2011 0945   NA 137 02/22/2011 1411   K 3.8 06/21/2012 0912   K 4.5 11/21/2011 0945   K 3.4* 02/22/2011 1411   CL 100 06/21/2012 0912   CL 96* 11/21/2011 0945   CL 99 02/22/2011 1411   CO2 32* 06/21/2012 0912   CO2 31 11/21/2011 0945   CO2 25 02/22/2011 1411   BUN 24.4 06/21/2012 0912   BUN 18 11/21/2011 0945   BUN 17 02/22/2011 1411   CREATININE 1.0 06/21/2012 0912   CREATININE 1.2 11/21/2011 0945   CREATININE 0.89 02/22/2011 1411      Component Value Date/Time   CALCIUM 9.3 06/21/2012 0912   CALCIUM 9.4 11/21/2011 0945   CALCIUM 9.1 02/22/2011 1411   CALCIUM 7.6* 09/18/2010 1136   ALKPHOS 65 06/21/2012 0912   ALKPHOS 50 11/21/2011 0945   ALKPHOS 70 02/22/2011 1411   AST 20 06/21/2012 0912   AST 24 11/21/2011 0945   AST 13 02/22/2011 1411   ALT 22 06/21/2012 0912   ALT 9 02/22/2011 1411   BILITOT 0.99 06/21/2012 0912   BILITOT 1.10 11/21/2011 0945   BILITOT 0.5 02/22/2011 1411       RADIOGRAPHIC STUDIES: Ct Chest W Contrast  06/21/2012  *RADIOLOGY REPORT*  Clinical Data: History of small cell lung cancer.  Restaging scan.  CT CHEST WITH CONTRAST  Technique:  Multidetector CT imaging of the chest was performed following the standard protocol during bolus administration of intravenous contrast.  Contrast: 80mL OMNIPAQUE IOHEXOL 300 MG/ML  SOLN  Comparison: CT of the chest 02/22/2012.  Findings:  Mediastinum: Heart size is normal. The There is no significant pericardial fluid, thickening or pericardial calcification. There is atherosclerosis of the thoracic aorta, the great vessels of the mediastinum and the coronary arteries, including calcified atherosclerotic plaque in the left main, left  anterior descending, left circumflex and right coronary arteries. Status post CABG with a LIMA to the LAD and saphenous vein grafts to the distal RCA and obtuse marginal circulations.  No pathologically enlarged mediastinal or hilar lymph nodes. The esophagus is unremarkable in appearance.  Mild soft tissue thickening in the left hilar region is nonspecific and similar to prior examinations, most compatible with postradiation change.  Left-sided subclavian single lumen Port- A-Cath with tip terminating in the distal superior vena cava. Right-sided pacemaker device in place with lead tips terminating in the right  atrium and right ventricular apex.  Lungs/Pleura: Advanced bullous emphysema is again noted, with most severe disease throughout the lung bases bilaterally, and in the superior segment of the left lower lobe.  Chronic areas of architectural distortion throughout the left upper lobe are very similar in appearance to the prior examination, compatible with chronic postradiation change.  There is a nodular opacity in the superior segment of the left lower lobe which appears to extend along a bronchovascular bundle, less conspicuous than prior examination of 01/12/2011, but slightly more conspicuous compared to the recent prior examination.  This is favored to represent some chronic mucoid impaction within some bronchi, however, attention on follow-up studies is recommended.  No other definite new suspicious appearing pulmonary nodule or masses otherwise noted.  No pleural effusions.  Upper Abdomen:   2.1 cm low attenuation lesion in the upper pole of the right kidney is incompletely visualized, but likely represent a simple cyst, and similar to prior examinations.The lesion in the right lobe of the liver between segments seven and eight described on prior examination from 11/21/2011 is more conspicuous on today's study.  This is centrally low attenuation with a peripheral rim of enhancement, and measures  approximately 2.4 cm on today's examination.  This is indeterminate.  No other hepatic lesions are confidently identified within the visualized portions of the liver.  Musculoskeletal: There are no aggressive appearing lytic or blastic lesions noted in the visualized portions of the skeleton.  Status post median sternotomy.  IMPRESSION: 1.  Stable post radiation changes in the left upper lobe, without findings to suggest local recurrence of disease or definite metastatic disease in the thorax. 2.  Unusual appearing nodular area in the superior segment of the left lower lobe tracking along the bronchovascular bundle, favored to represent mucoid impaction within some bronchi.  Attention on follow-up studies is recommended to ensure the stability or resolution of this finding. 3.  In addition, there is an indeterminate lesion in the right lobe of the liver at the junction between segments seven and eight (image 53 of series 2) which is more conspicuous on the prior examinations. Further evaluation further evaluation with contrast enhanced MRI would typically be recommended to better evaluate this lesion, however, the patient has a pacemaker.  Accordingly, future follow-up studies should be performed as a contrast enhanced CT of the chest and abdomen to better evaluate this lesion and the remainder of the liver. 4. Atherosclerosis, including three-vessel coronary artery disease. The patient is status post median sternotomy for CABG with a LIMA to the LAD and saphenous vein grafts, as above.   Original Report Authenticated By: Trudie Reed, M.D.     ASSESSMENT: This is a very pleasant 79 years old white male with limited stage small cell lung cancer status post concurrent chemoradiation followed by prophylactic cranial irradiation and has been observation since August of 2012 with no evidence for disease recurrence.  PLAN: I discussed the scan results with the patient and his family. I recommended for him to  continue on observation for now. I would see him back for followup visit in 6 months with repeat CT scan of the chest. The patient was advised to call immediately if he has any concerning symptoms in the interval.  All questions were answered. The patient knows to call the clinic with any problems, questions or concerns. We can certainly see the patient much sooner if necessary.

## 2012-06-25 NOTE — Patient Instructions (Signed)
No evidence for disease recurrence on the recent scan.  Followup visit in 6 months with repeat CT scan of the chest. 

## 2012-07-22 ENCOUNTER — Telehealth: Payer: Self-pay | Admitting: Emergency Medicine

## 2012-07-22 NOTE — Telephone Encounter (Signed)
LMTCB

## 2012-07-22 NOTE — Telephone Encounter (Signed)
I spoke with spouse. She stated pt is on 3.5 L cont. On Saturday pt went to the bathroom and came back and was gasping for air. He was still on his O2. She checked his O2 level and was 75%. It took about 1 hr for him to get back up to 90% on 3.5 L. She stated today he is sating at 93% on 3.5 L. Pt has been more SOB than normal. Spouse is wanting to know is this to be expected with his COPD? Do they need to increase his O2 level? Please advise RB thanks

## 2012-07-22 NOTE — Telephone Encounter (Signed)
Need to do do 2 things -  - have DME company perform walking oxygen titration to determine flow needed to keep SpO2 > 90% rest and walking - have him come in to be seen to insure that there is not an acute process happening that is causing him to desat or be worse.

## 2012-07-23 NOTE — Telephone Encounter (Signed)
I spoke with spouse. She scheduled pt come in and see RB tomorrow at 10:45. She stated she will wait for the walk test for when they come in. Nothing further was needed

## 2012-07-24 ENCOUNTER — Ambulatory Visit (INDEPENDENT_AMBULATORY_CARE_PROVIDER_SITE_OTHER): Payer: Medicare Other | Admitting: Emergency Medicine

## 2012-07-24 ENCOUNTER — Encounter: Payer: Self-pay | Admitting: Emergency Medicine

## 2012-07-24 VITALS — BP 128/82 | HR 82 | Temp 97.4°F | Wt 159.4 lb

## 2012-07-24 DIAGNOSIS — J449 Chronic obstructive pulmonary disease, unspecified: Secondary | ICD-10-CM

## 2012-07-24 NOTE — Progress Notes (Signed)
Subjective:    Patient ID: [name], male    DOB: 12-25-1930, 79 y.o.   MRN: [id number] HPI 79 yo man, former tobacco 100 pk-yr, CAD s/p CABG and PTCI, hx small cell lung CA in 3/12 s/p chemo and XRT last in 6/12, just completed whole brain radiation. F/u imaging with improvement in the small cell CA, to have repeat CT in October. He presents today with fatigue, exertional dyspnea. Had been on Advair prior to the lung CA. He does not have SABA at this time. He was started on O2, doesn't wear reliably, needs it w exertion.   ROV 12/30/10 -- follow up, hx CAD, tobacco w COPD, small cell lung CA s/p chemo and XRT.  Last time we added Spiriva to Advair - he believes that his breathing is much better. He has not been wearing his his O2 as reliably. He had PFT in April '12. He has some congestion/URI type symptoms. Improved. Still with some UA irritation.   ROV 03/29/11 -- follow up, hx CAD, tobacco w COPD, small cell lung CA s/p chemo and XRT.  He has finished brain XRT. Was admitted to [name] with severe nausea, GERD, wt loss.  He is doing much better now - planning for CT chest and brain 04/13/10 [name] [place]). He is tolerating Advair and Spiriva. Hoarse voice is better now that GERD is treated. He has not needed SABA.   ROV 06/28/11 -- COPD with SCLCA s/p chemo + XRT with great response. Due for repeat CT scan in April. He has been much more active, has more energy. Breathing well, is not using O2 frequently. He is on Advair, Spiriva. Has SABA available.   ROV 09/21/11 -- COPD with SCLCA s/p chemo + XRT with great response. His wife says that he has not been doing well, was treated for bronchitis in April w abx and resolved. Then had episode hemoptysis, prompted more abx and a break from ASA and plavix. Had CT scan in 4/13 that showed LUL radiation changes.  Has been wearing O2 at all times.   ROV 12/18/11 -- COPD with SCLCA s/p chemo + XRT with great response. Returns for f/u/.  He says that his  breathing has been somewhat worse with exertion and when he is full after a meal. On advair + spiriva. Bothers him in the am. He is skipping/forgetting the spiriva some times. He uses albuterol about 2 -3 x a day.   ROV 04/01/12 -- COPD with SCLCA s/p chemo + XRT with great response. Returns for f/u. He is having much more exertional SOB over the last yr. Using Spiriva and Advair reliably, uses albuterol up to 4x a day. he follows with Dr Allyson Sabal, ? Whether this is a cardiac limitation.   ROV 05/10/12 -- COPD with SCLCA s/p chemo + XRT with great response. Returns today after PFT to characterize worsening exertional SOB. He also had a low risk perfusion scan 04/24/12 that showed a fixed perfusion defect. His AFL is very severely reduced, DLCO low.  Last time we increased o2 to 4L/min w exertion, 3L/min at rest. He believes this has helped him.     PULMONARY FUNCTON TEST 05/10/2012  FVC 2.14  FEV1 .81  FEV1/FVC 37.9  FVC  % Predicted 56  FEV % Predicted 33  FeF 25-75 .26  FeF 25-75 % Predicted 2.15    Acute OV 07/24/12 --  COPD with SCLCA s/p chemo + XRT with great response. 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 He  called [place] 3-4 d ago for episodic exertional SOB associated with hypoxemia. I ordered O2 titration study. Today he reports that his breathing is improved. He has a bit less congestion, URI seems to be improving.  He doesn't have wheeze. Cough is better.    TTE 04/24/12 --  Study Conclusions - Left ventricle: The cavity size was normal. Wall thickness was increased in a pattern of mild LVH. There was mild concentric hypertrophy. Systolic function was normal. The estimated ejection fraction was in the range of 55% to 60%. Wall motion was normal; there were no regional wall motion abnormalities. Doppler parameters are consistent with abnormal left ventricular relaxation (grade 1 diastolic dysfunction). - Mitral valve: Mild regurgitation. Valve area by pressure half-time: 2.06cm^2. - Right ventricle:  Systolic pressure was increased. - Atrial septum: No defect or patent foramen ovale was identified. - Tricuspid valve: Mild-moderate regurgitation. - Pulmonary arteries: PA peak pressure: 42mm Hg (S). Impressions: - The right ventricular systolic pressure was increased consistent with moderate pulmonary hypertension.     Objective:   Physical Exam Filed Vitals:   07/24/12 1031  BP: 128/82  Pulse: 82  Temp: 97.4 F (36.3 C)   Gen: Pleasant, well-nourished, in no distress,  normal affect  ENT: No lesions,  mouth clear,  oropharynx clear, no postnasal drip, mild hoarse voice  Neck: No JVD, no TMG, no carotid bruits  Lungs: No use of accessory muscles, very distant, no wheeze  Cardiovascular: RRR, heart sounds normal, no murmur or gallops, no peripheral edema  Musculoskeletal: No deformities, no cyanosis or clubbing  Neuro: alert, non focal  Skin: Warm, no lesions or rashes     Assessment & Plan:  COPD (chronic obstructive pulmonary disease) Had transient desat, probably due to URI + more exertion than he is used to. No evidence of exacerbation at this time - continue current therapy - plan to titrate the o2 w exertion.  - rov 3 mon

## 2012-07-24 NOTE — Assessment & Plan Note (Signed)
Had transient desat, probably due to URI + more exertion than he is used to. No evidence of exacerbation at this time - continue current therapy - plan to titrate the o2 w exertion.  - rov 3 mon

## 2012-07-24 NOTE — Patient Instructions (Addendum)
Please continue your current medications We will titrate your oxygen with walking to ensure that your settings are correct.  Follow with Dr [name] in 3 months or sooner if you have any problems.

## 2012-08-12 ENCOUNTER — Encounter: Payer: Self-pay | Admitting: Cardiovascular Disease

## 2012-08-14 ENCOUNTER — Ambulatory Visit: Payer: Medicare Other | Admitting: Emergency Medicine

## 2012-08-15 ENCOUNTER — Ambulatory Visit (HOSPITAL_BASED_OUTPATIENT_CLINIC_OR_DEPARTMENT_OTHER): Payer: Medicare Other

## 2012-08-15 DIAGNOSIS — Z452 Encounter for adjustment and management of vascular access device: Secondary | ICD-10-CM

## 2012-08-15 DIAGNOSIS — C3410 Malignant neoplasm of upper lobe, unspecified bronchus or lung: Secondary | ICD-10-CM

## 2012-08-15 MED ORDER — SODIUM CHLORIDE 0.9 % IJ SOLN
10.0000 mL | INTRAMUSCULAR | Status: DC | PRN
  Administered 2012-08-15: 10 mL via INTRAVENOUS
  Filled 2012-08-15: qty 10

## 2012-08-15 MED ORDER — HEPARIN SOD (PORK) LOCK FLUSH 100 UNIT/ML IV SOLN
500.0000 [IU] | Freq: Once | INTRAVENOUS | Status: AC
  Administered 2012-08-15: 500 [IU] via INTRAVENOUS
  Filled 2012-08-15: qty 5

## 2012-08-15 NOTE — Patient Instructions (Signed)
Call MD for problems or concerns 

## 2012-10-10 ENCOUNTER — Ambulatory Visit (HOSPITAL_BASED_OUTPATIENT_CLINIC_OR_DEPARTMENT_OTHER): Payer: Medicare Other

## 2012-10-10 VITALS — BP 107/64 | HR 78 | Temp 96.8°F

## 2012-10-10 DIAGNOSIS — C3490 Malignant neoplasm of unspecified part of unspecified bronchus or lung: Secondary | ICD-10-CM

## 2012-10-10 DIAGNOSIS — C3410 Malignant neoplasm of upper lobe, unspecified bronchus or lung: Secondary | ICD-10-CM

## 2012-10-10 DIAGNOSIS — Z452 Encounter for adjustment and management of vascular access device: Secondary | ICD-10-CM

## 2012-10-10 MED ORDER — SODIUM CHLORIDE 0.9 % IJ SOLN
10.0000 mL | INTRAMUSCULAR | Status: DC | PRN
  Administered 2012-10-10: 10 mL via INTRAVENOUS
  Filled 2012-10-10: qty 10

## 2012-10-10 MED ORDER — HEPARIN SOD (PORK) LOCK FLUSH 100 UNIT/ML IV SOLN
500.0000 [IU] | Freq: Once | INTRAVENOUS | Status: AC
  Administered 2012-10-10: 500 [IU] via INTRAVENOUS
  Filled 2012-10-10: qty 5

## 2012-10-23 ENCOUNTER — Ambulatory Visit (INDEPENDENT_AMBULATORY_CARE_PROVIDER_SITE_OTHER): Payer: Medicare Other | Admitting: Emergency Medicine

## 2012-10-23 ENCOUNTER — Encounter: Payer: Self-pay | Admitting: Emergency Medicine

## 2012-10-23 VITALS — BP 112/64 | HR 89 | Temp 96.5°F | Ht 68.0 in | Wt 156.8 lb

## 2012-10-23 DIAGNOSIS — R059 Cough, unspecified: Secondary | ICD-10-CM | POA: Insufficient documentation

## 2012-10-23 DIAGNOSIS — J449 Chronic obstructive pulmonary disease, unspecified: Secondary | ICD-10-CM

## 2012-10-23 DIAGNOSIS — R05 Cough: Secondary | ICD-10-CM

## 2012-10-23 MED ORDER — LORATADINE 10 MG PO TABS: 10.0000 mg | ORAL_TABLET | Freq: Every day | ORAL | Status: DC

## 2012-10-23 NOTE — Progress Notes (Signed)
Subjective:    Patient ID: [name], male    DOB: Nov 29, 1930, 79 y.o.   MRN: [id number] HPI 79 yo man, former tobacco 100 pk-yr, CAD s/p CABG and PTCI, hx small cell lung CA in 3/12 s/p chemo and XRT last in 6/12, just completed whole brain radiation. F/u imaging with improvement in the small cell CA, to have repeat CT in October. He presents today with fatigue, exertional dyspnea. Had been on Advair prior to the lung CA. He does not have SABA at this time. He was started on O2, doesn't wear reliably, needs it w exertion.   ROV 12/30/10 -- follow up, hx CAD, tobacco w COPD, small cell lung CA s/p chemo and XRT.  Last time we added Spiriva to Advair - he believes that his breathing is much better. He has not been wearing his his O2 as reliably. He had PFT in April '12. He has some congestion/URI type symptoms. Improved. Still with some UA irritation.   ROV 03/29/11 -- follow up, hx CAD, tobacco w COPD, small cell lung CA s/p chemo and XRT.  He has finished brain XRT. Was admitted to [facility] with severe nausea, GERD, wt loss.  He is doing much better now - planning for CT chest and brain 04/13/10 [name] [place]). He is tolerating Advair and Spiriva. Hoarse voice is better now that GERD is treated. He has not needed SABA.   ROV 06/28/11 -- COPD with SCLCA s/p chemo + XRT with great response. Due for repeat CT scan in April. He has been much more active, has more energy. Breathing well, is not using O2 frequently. He is on Advair, Spiriva. Has SABA available.   ROV 09/21/11 -- COPD with SCLCA s/p chemo + XRT with great response. His wife says that he has not been doing well, was treated for bronchitis in April w abx and resolved. Then had episode hemoptysis, prompted more abx and a break from ASA and plavix. Had CT scan in 4/13 that showed LUL radiation changes.  Has been wearing O2 at all times.   ROV 12/18/11 -- COPD with SCLCA s/p chemo + XRT with great response. Returns for f/u/.  He says that his  breathing has been somewhat worse with exertion and when he is full after a meal. On advair + spiriva. Bothers him in the am. He is skipping/forgetting the spiriva some times. He uses albuterol about 2 -3 x a day.   ROV 04/01/12 -- COPD with SCLCA s/p chemo + XRT with great response. Returns for f/u. He is having much more exertional SOB over the last yr. Using Spiriva and Advair reliably, uses albuterol up to 4x a day. he follows with Dr Allyson Sabal, ? Whether this is a cardiac limitation.   ROV 05/10/12 -- COPD with SCLCA s/p chemo + XRT with great response. Returns today after PFT to characterize worsening exertional SOB. He also had a low risk perfusion scan 04/24/12 that showed a fixed perfusion defect. His AFL is very severely reduced, DLCO low.  Last time we increased o2 to 4L/min w exertion, 3L/min at rest. He believes this has helped him.     PULMONARY FUNCTON TEST 05/10/2012  FVC 2.14  FEV1 .81  FEV1/FVC 37.9  FVC  % Predicted 56  FEV % Predicted 33  FeF 25-75 .26  FeF 25-75 % Predicted 2.15    Acute OV 07/24/12 --  COPD with SCLCA s/p chemo + XRT with great response. 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 He  called [place] 3-4 d ago for episodic exertional SOB associated with hypoxemia. I ordered O2 titration study. Today he reports that his breathing is improved. He has a bit less congestion, URI seems to be improving.  He doesn't have wheeze. Cough is better.   ROV 10/24/12 --  COPD with SCLCA s/p chemo + XRT with great response. 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 He is having more exertional sob, increased to 5L/min.  Will ask [facility] to titrate O2 and get an oximizer. More nose drainage, bothering his throat. Taking benadryl prn rarely. He is on lisinopril.    TTE 04/24/12 --  Study Conclusions - Left ventricle: The cavity size was normal. Wall thickness was increased in a pattern of mild LVH. There was mild concentric hypertrophy. Systolic function was normal. The estimated ejection fraction was in the range of 55% to 60%.  Wall motion was normal; there were no regional wall motion abnormalities. Doppler parameters are consistent with abnormal left ventricular relaxation (grade 1 diastolic dysfunction). - Mitral valve: Mild regurgitation. Valve area by pressure half-time: 2.06cm^2. - Right ventricle: Systolic pressure was increased. - Atrial septum: No defect or patent foramen ovale was identified. - Tricuspid valve: Mild-moderate regurgitation. - Pulmonary arteries: PA peak pressure: 42mm Hg (S). Impressions: - The right ventricular systolic pressure was increased consistent with moderate pulmonary hypertension.     Objective:   Physical Exam Filed Vitals:   10/23/12 1408  BP: 112/64  Pulse: 89  Temp: 96.5 F (35.8 C)   Gen: Pleasant, well-nourished, in no distress,  normal affect  ENT: No lesions,  mouth clear,  oropharynx clear, no postnasal drip, mild hoarse voice  Neck: No JVD, no TMG, no carotid bruits  Lungs: No use of accessory muscles, very distant, no wheeze  Cardiovascular: RRR, heart sounds normal, no murmur or gallops, no peripheral edema  Musculoskeletal: No deformities, no cyanosis or clubbing  Neuro: alert, non focal  Skin: Warm, no lesions or rashes     Assessment & Plan:  COPD (chronic obstructive pulmonary disease) Will insure that he is adequately oxygenated, get him an oximizer Discussed Breo today. Will stay on advair + spiriva for now  Cough Add loratadine

## 2012-10-23 NOTE — Patient Instructions (Addendum)
Please continue your spiriva and advair We will ask [facility] to titrate your oxygen need at rest and with walking, and to get an oximizer.  Start loratadine 10mg  daily You may still use your benadryl and alka-seltzer Follow with Dr [name] in 3 months or sooner if you have any problems.

## 2012-10-23 NOTE — Assessment & Plan Note (Signed)
Add loratadine

## 2012-10-23 NOTE — Assessment & Plan Note (Signed)
Will insure that he is adequately oxygenated, get him an oximizer Discussed Breo today. Will stay on advair + spiriva for now

## 2012-10-24 ENCOUNTER — Ambulatory Visit (INDEPENDENT_AMBULATORY_CARE_PROVIDER_SITE_OTHER): Payer: Medicare Other | Admitting: Cardiovascular Disease

## 2012-10-24 ENCOUNTER — Encounter: Payer: Self-pay | Admitting: Cardiovascular Disease

## 2012-10-24 VITALS — BP 118/68 | HR 80 | Ht 68.0 in | Wt 155.0 lb

## 2012-10-24 DIAGNOSIS — I2510 Atherosclerotic heart disease of native coronary artery without angina pectoris: Secondary | ICD-10-CM

## 2012-10-24 DIAGNOSIS — I779 Disorder of arteries and arterioles, unspecified: Secondary | ICD-10-CM

## 2012-10-24 DIAGNOSIS — I480 Paroxysmal atrial fibrillation: Secondary | ICD-10-CM

## 2012-10-24 DIAGNOSIS — E785 Hyperlipidemia, unspecified: Secondary | ICD-10-CM

## 2012-10-24 DIAGNOSIS — Z79899 Other long term (current) drug therapy: Secondary | ICD-10-CM

## 2012-10-24 DIAGNOSIS — I10 Essential (primary) hypertension: Secondary | ICD-10-CM

## 2012-10-24 DIAGNOSIS — I4891 Unspecified atrial fibrillation: Secondary | ICD-10-CM

## 2012-10-24 MED ORDER — LOSARTAN POTASSIUM-HCTZ 50-12.5 MG PO TABS: 1.0000 | ORAL_TABLET | Freq: Every day | ORAL | Status: AC

## 2012-10-24 NOTE — Assessment & Plan Note (Signed)
Status post leftcarotid endarterectomy followed by duplex ultrasound in our office

## 2012-10-24 NOTE — Progress Notes (Signed)
10/24/2012 [facility]   04-17-1930  [id number]  Primary Physician ROBERTS, [name], MD Primary Cardiologist: Runell Gess MD Roseanne Reno   HPI:  The patient is a delightful 79 year old thin-appearing married Caucasian male, father of 2 and grandfather to 4 grandchildren (10 step-grandchildren), whom I last saw 6 months ago. He is accompanied by his wife today. He has a history of CAD, status post inferior-wall myocardial infarction treated with emergency PTCA with RCA by me November 25, 1989, with a reperfusion time of 1 hour 55 minutes. He had bypass surgery in 2000 with a LIMA to his LAD, a vein to the OM1 and to the PDA. He has also had a left carotid endarterectomy which was followed by duplex ultrasound most recently March 21, 2011, which revealed a patent endarterectomy site with no evidence of right ICA stenosis. He had PAF and a permanent transvenous pacemaker was placed by Dr. Lynnea Ferrier several years ago which Dr. Royann Shivers follows. He also had a distal RCA lesion which was intervened on by Dr. Caprice Kluver June 10, 2010. His last Myoview, performed March 10, 2009, was nonischemic. He had lung cancer and underwent chemotherapy and radiation therapy by Dr. [name], who has been following him. He now has severe COPD, on chronic O2. He saw Dr. [name] last month. He thought that his COPD had progressed and was questioning whether there was an ischemic component. Patient denies chest pain. Since I last saw him 6 months ago there has been no change. He is chronically short of breath but denies chest pain. He is on 5 L of oxygen. Dr. Corliss Blacker follows his lungs and Dr. Shirline Frees his small cell lung cancer which apparently is in remission.     Current Outpatient Prescriptions  Medication Sig Dispense Refill  . acetaminophen (TYLENOL) 325 MG tablet Take 650 mg by mouth every 6 (six) hours as needed.        Marland Kitchen albuterol (VENTOLIN HFA) 108 (90 BASE) MCG/ACT inhaler Inhale 2 puffs into  the lungs every 4 (four) hours as needed for wheezing.  1 Inhaler  5  . aspirin EC 325 MG tablet Take 325 mg by mouth daily.       . calcium acetate (PHOSLO) 667 MG capsule Take 667 mg by mouth.      . Chlorphen-Pseudoephed-APAP (CORICIDIN D PO) Take 2 tablets by mouth daily.      . clopidogrel (PLAVIX) 75 MG tablet Take 1 tablet by mouth daily.      . DiphenhydrAMINE HCl (BENADRYL ALLERGY PO) Take 1 tablet by mouth as needed.      . feeding supplement (GLUCERNA SHAKE) LIQD Take 237 mLs by mouth 2 (two) times daily before a meal.        . Fluticasone-Salmeterol (ADVAIR DISKUS) 100-50 MCG/DOSE AEPB Inhale 1 puff into the lungs 2 (two) times daily.  60 each  5  . isosorbide mononitrate (IMDUR) 30 MG 24 hr tablet Take 1 tablet by mouth daily.      Marland Kitchen loratadine (CLARITIN) 10 MG tablet Take 1 tablet (10 mg total) by mouth daily.  30 tablet  4  . metoprolol tartrate (LOPRESSOR) 25 MG tablet Take 25 mg by mouth. 12.5mg  BID      . Multiple Vitamin (MULTIVITAMIN) tablet Take 1 tablet by mouth daily.        . nitroGLYCERIN (NITROSTAT) 0.4 MG SL tablet Place 0.4 mg under the tongue every 5 (five) minutes as needed.        Marland Kitchen  Phenyleph-CPM-DM-Aspirin (ALKA-SELTZER PLUS COLD & COUGH PO) Take 2 tablets by mouth daily.      . simvastatin (ZOCOR) 20 MG tablet Take 20 mg by mouth at bedtime.        Marland Kitchen tiotropium (SPIRIVA HANDIHALER) 18 MCG inhalation capsule Place 1 capsule (18 mcg total) into inhaler and inhale daily.  30 capsule  5  . UNABLE TO FIND Med Name: prostate supplement      . [DISCONTINUED] pantoprazole (PROTONIX) 40 MG tablet Take 40 mg by mouth daily.        No current facility-administered medications for this visit.    No Known Allergies  History   Social History  . Marital Status: Married    Spouse Name: N/A    Number of Children: N/A  . Years of Education: N/A   Occupational History  . Not on file.   Social History Main Topics  . Smoking status: Former Smoker -- 2.00 packs/day for  50 years    Types: Cigarettes    Quit date: 02/08/2006  . Smokeless tobacco: Never Used  . Alcohol Use: Yes     Comment: rare  . Drug Use: Not on file  . Sexually Active: Not on file   Other Topics Concern  . Not on file   Social History Narrative  . No narrative on file     Review of Systems: General: negative for chills, fever, night sweats or weight changes.  Cardiovascular: negative for chest pain, dyspnea on exertion, edema, orthopnea, palpitations, paroxysmal nocturnal dyspnea or shortness of breath Dermatological: negative for rash Respiratory: negative for cough or wheezing Urologic: negative for hematuria Abdominal: negative for nausea, vomiting, diarrhea, bright red blood per rectum, melena, or hematemesis Neurologic: negative for visual changes, syncope, or dizziness All other systems reviewed and are otherwise negative except as noted above.    Blood pressure 118/68, pulse 80, height 5\' 8"  (1.727 m), weight 155 lb (70.308 kg).  General appearance: alert and no distress Neck: no adenopathy, no carotid bruit, no JVD, supple, symmetrical, trachea midline and thyroid not enlarged, symmetric, no tenderness/mass/nodules Lungs: clear to auscultation bilaterally Heart: regular rate and rhythm, S1, S2 normal, no murmur, click, rub or gallop Extremities: extremities normal, atraumatic, no cyanosis or edema  EKG normal sinus rhythm at 80 with left axis deviation  ASSESSMENT AND PLAN:   Coronary artery disease Status post inferior wall microinfarction the emergency PTCA of his RCA by myself 11/25/89 with a reperfusion time of 1 hour and 55 minutes. He had coronary bypass grafting in 2000 with a LIMA to his LAD, vein to obtuse marginal branch #1 and to the PDA. He had a Myoview stress test performed 03/10/09 which was nonischemic.  Carotid artery disease Status post leftcarotid endarterectomy followed by duplex ultrasound in our office  Paroxysmal atrial fibrillation Status  post permanent transvenous pacemaker insertion by Dr. Lynnea Ferrier followed by Dr.Croitoru      Runell Gess MD [place], [facility] 10/24/2012 5:45 PM Runell Gess MD FACP,FACC,FAHA, [facility]

## 2012-10-24 NOTE — Patient Instructions (Addendum)
  We will see you back in follow up in one month with an extender and 6 months with Dr Allyson Sabal   Stop Lisinopril  Start losartan hct 50/12.5mg  daily  Have bloodwork done in 2 weeks

## 2012-10-24 NOTE — Assessment & Plan Note (Signed)
Status post inferior wall microinfarction the emergency PTCA of his RCA by myself 11/25/89 with a reperfusion time of 1 hour and 55 minutes. He had coronary bypass grafting in 2000 with a LIMA to his LAD, vein to obtuse marginal branch #1 and to the PDA. He had a Myoview stress test performed 03/10/09 which was nonischemic.

## 2012-10-24 NOTE — Assessment & Plan Note (Signed)
Status post permanent transvenous pacemaker insertion by Dr. Lynnea Ferrier followed by Dr.Croitoru

## 2012-11-06 ENCOUNTER — Telehealth: Payer: Self-pay | Admitting: Emergency Medicine

## 2012-11-06 NOTE — Telephone Encounter (Signed)
I spoke with spouse. She stated pt was set up with oximyzer. 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 It is hard and big and causes his ears to be sore. He can't use this on the portable O2. Pt is requiring 5-6 liters of O2. Per spouse they went back to the regular cannula they had prior and inogen set pt up with a portable concentrator that goes up to 6 liters. Also she stated with having the oximyzer they had to take the humidifier off at night and his nose would dry up and bleed. Another reason tey went back to the reg cannula. Please advise RB thanks

## 2012-11-07 ENCOUNTER — Other Ambulatory Visit: Payer: Self-pay | Admitting: Cardiovascular Disease

## 2012-11-07 DIAGNOSIS — I4891 Unspecified atrial fibrillation: Secondary | ICD-10-CM

## 2012-11-07 LAB — PACEMAKER DEVICE OBSERVATION

## 2012-11-07 NOTE — Telephone Encounter (Signed)
Please call him and see if there is any way we can help make it easier to use - ? Try gauze on his ears, etc??

## 2012-11-07 NOTE — Telephone Encounter (Signed)
Spoke with patients spouse she states she has put the humidifier on the oximyzer and has not had any problems at this time Says she does not see any water in the tubing  Per patients spouse She will keep it this way since she doesn't see any problems to prevent patient from having any nosebleeds I have advised patients spouse ry gauze, bandaid on tubing where it lays on ear, cotton balls on the ear where it hurts so tubing can lay on those, anything that would provide a soft barrier between the patients ear and tubing would be helpful Patients spouse verbalized understanding and nothing further needed at this time

## 2012-11-08 LAB — BASIC METABOLIC PANEL
BUN: 17 mg/dL (ref 6–23)
CO2: 35 mEq/L — ABNORMAL HIGH (ref 19–32)
Chloride: 101 mEq/L (ref 96–112)
Glucose, Bld: 106 mg/dL — ABNORMAL HIGH (ref 70–99)
Potassium: 4.5 mEq/L (ref 3.5–5.3)

## 2012-11-11 ENCOUNTER — Encounter: Payer: Self-pay | Admitting: *Deleted

## 2012-11-13 ENCOUNTER — Other Ambulatory Visit: Payer: Self-pay

## 2012-11-21 ENCOUNTER — Encounter: Payer: Self-pay | Admitting: Cardiology

## 2012-11-26 ENCOUNTER — Encounter: Payer: Self-pay | Admitting: Cardiology

## 2012-11-26 ENCOUNTER — Encounter: Payer: Self-pay | Admitting: *Deleted

## 2012-11-26 ENCOUNTER — Ambulatory Visit (INDEPENDENT_AMBULATORY_CARE_PROVIDER_SITE_OTHER): Payer: Medicare Other | Admitting: Cardiology

## 2012-11-26 VITALS — BP 122/74 | HR 56 | Ht 68.0 in | Wt 158.9 lb

## 2012-11-26 DIAGNOSIS — R600 Localized edema: Secondary | ICD-10-CM

## 2012-11-26 DIAGNOSIS — I10 Essential (primary) hypertension: Secondary | ICD-10-CM

## 2012-11-26 DIAGNOSIS — I2510 Atherosclerotic heart disease of native coronary artery without angina pectoris: Secondary | ICD-10-CM

## 2012-11-26 DIAGNOSIS — C3490 Malignant neoplasm of unspecified part of unspecified bronchus or lung: Secondary | ICD-10-CM

## 2012-11-26 DIAGNOSIS — I480 Paroxysmal atrial fibrillation: Secondary | ICD-10-CM

## 2012-11-26 DIAGNOSIS — R0602 Shortness of breath: Secondary | ICD-10-CM

## 2012-11-26 DIAGNOSIS — R609 Edema, unspecified: Secondary | ICD-10-CM

## 2012-11-26 DIAGNOSIS — J449 Chronic obstructive pulmonary disease, unspecified: Secondary | ICD-10-CM

## 2012-11-26 DIAGNOSIS — I4891 Unspecified atrial fibrillation: Secondary | ICD-10-CM

## 2012-11-26 LAB — REMOTE PACEMAKER DEVICE
AL THRESHOLD: 0.375 V
ATRIAL PACING PM: 1.4
DEVICE MODEL PM: 7209123
RV LEAD AMPLITUDE: 9.4 mv
RV LEAD IMPEDENCE PM: 460 Ohm
RV LEAD THRESHOLD: 0.5 V

## 2012-11-26 MED ORDER — FUROSEMIDE 40 MG PO TABS: 40.0000 mg | ORAL_TABLET | Freq: Every day | ORAL | Status: DC

## 2012-11-26 MED ORDER — POTASSIUM CHLORIDE CRYS ER 20 MEQ PO TBCR: 20.0000 meq | EXTENDED_RELEASE_TABLET | Freq: Every day | ORAL | Status: DC

## 2012-11-26 NOTE — Assessment & Plan Note (Signed)
Followed by Dr. Byrum.  

## 2012-11-26 NOTE — Assessment & Plan Note (Signed)
Followed by Dr Mohamed 

## 2012-11-26 NOTE — Progress Notes (Signed)
11/26/2012   PCP: Lorenda Peck, MD   Chief Complaint  Patient presents with  . Follow-up    SOB - COPD; lightheadedness/dizziness (inner ear); bilat LE edema - has not improved, L>R; lisinopril - losartan-hct, labs    Primary Cardiologist: Dr. Erlene Quan   HPI:  79 year old white married male presents today for followup per Dr. Allyson Sabal for lower extremity edema.  He is accompanied by his wife today. He has a history of CAD, status post inferior-wall myocardial infarction treated with emergency PTCA with RCA  November 25, 1989, with a reperfusion time of 1 hour 55 minutes. He had bypass surgery in 2000 with a LIMA to his LAD, a vein to the OM1 and to the PDA. He has also had a left carotid endarterectomy which was followed by duplex ultrasound most recently March 21, 2011, which revealed a patent endarterectomy site with no evidence of right ICA stenosis. He had PAF and a permanent transvenous pacemaker (St. Jude) was placed by Dr. Lynnea Ferrier several years ago which Dr. Royann Shivers follows. He also had a distal RCA lesion which was intervened on by Dr. Caprice Kluver June 10, 2010. His last Myoview, performed 04-24-12, was nonischemic. He had lung cancer and underwent chemotherapy and radiation therapy by Dr. [name], who has been following him. He now has severe COPD, on chronic O2.   On his last visit secondary to lower extremity edema his Lisinopril was stopped and losartan HCTZ was added.  Since that time his edema continues. He does admit to eating salty foods we discussed 2000 mg stopping point.  His shortness of breath does not seem any worse to him than his usual.  His wife kept a journal of blood pressures in the mornings he runs in the 140s systolic occasionally higher but mostly in the 140s on but in the late mid-day he is in the 90s systolic to 105.   No Known Allergies  Current Outpatient Prescriptions  Medication Sig Dispense Refill  . acetaminophen (TYLENOL) 325 MG tablet  Take 650 mg by mouth every 6 (six) hours as needed.        Marland Kitchen albuterol (VENTOLIN HFA) 108 (90 BASE) MCG/ACT inhaler Inhale 2 puffs into the lungs every 4 (four) hours as needed for wheezing.  1 Inhaler  5  . aspirin EC 325 MG tablet Take 325 mg by mouth daily.       . calcium acetate (PHOSLO) 667 MG capsule Take 667 mg by mouth.      . Chlorphen-Pseudoephed-APAP (CORICIDIN D PO) Take 2 tablets by mouth daily.      . clopidogrel (PLAVIX) 75 MG tablet Take 1 tablet by mouth daily.      . DiphenhydrAMINE HCl (BENADRYL ALLERGY PO) Take 1 tablet by mouth as needed.      . feeding supplement (GLUCERNA SHAKE) LIQD Take 237 mLs by mouth 2 (two) times daily before a meal.        . Fluticasone-Salmeterol (ADVAIR DISKUS) 100-50 MCG/DOSE AEPB Inhale 1 puff into the lungs 2 (two) times daily.  60 each  5  . isosorbide mononitrate (IMDUR) 30 MG 24 hr tablet Take 1 tablet by mouth daily.      Marland Kitchen loratadine (CLARITIN) 10 MG tablet Take 1 tablet (10 mg total) by mouth daily.  30 tablet  4  . losartan-hydrochlorothiazide (HYZAAR) 50-12.5 MG per tablet Take 1 tablet by mouth daily.  90 tablet  3  . metoprolol tartrate (LOPRESSOR) 25 MG tablet  Take 25 mg by mouth. 12.5mg  BID      . Multiple Vitamin (MULTIVITAMIN) tablet Take 1 tablet by mouth daily.        . nitroGLYCERIN (NITROSTAT) 0.4 MG SL tablet Place 0.4 mg under the tongue every 5 (five) minutes as needed.        . OXYGEN-HELIUM IN Inhale 6 L into the lungs continuous.      Marland Kitchen Phenyleph-CPM-DM-Aspirin (ALKA-SELTZER PLUS COLD & COUGH PO) Take 2 tablets by mouth daily.      . simvastatin (ZOCOR) 20 MG tablet Take 20 mg by mouth at bedtime.        Marland Kitchen tiotropium (SPIRIVA HANDIHALER) 18 MCG inhalation capsule Place 1 capsule (18 mcg total) into inhaler and inhale daily.  30 capsule  5  . UNABLE TO FIND Med Name: prostate supplement      . furosemide (LASIX) 40 MG tablet Take 1 tablet (40 mg total) by mouth daily.  10 tablet  0  . potassium chloride SA  (K-DUR,KLOR-CON) 20 MEQ tablet Take 1 tablet (20 mEq total) by mouth daily.  10 tablet  0  . [DISCONTINUED] pantoprazole (PROTONIX) 40 MG tablet Take 40 mg by mouth daily.        No current facility-administered medications for this visit.    Past Medical History  Diagnosis Date  . Hypertension   . Heart attack 1991    Inferior- wall MI  treated with emergency PTCA with RCA by Dr. Allyson Sabal, with a perfusion time of 1 hour 55 minutes.  . Emphysema   . Hyperlipidemia   . Allergic rhinitis   . Heart failure   . Cholelithiasis   . Cardiac pacemaker     Dule-chamber permanent pacemaker - St. Jude Accent DR RF device model # [id number], serial # [id number].  Marland Kitchen COPD (chronic obstructive pulmonary disease)     Longstanding COPD. Uses oxygen - 4 L per minute.  . SOB (shortness of breath)     Transthoracic Echo 04/24/12 EF = 55-60%. Showed sinus rhythm with left atrial abnormality, left axis deviation, and almost an atypical pulmonary disease pattern.   . Paroxysmal a-fib     Permanent Transvenous pacemaker insertion by Dr. Lynnea Ferrier in 2012 and is followed by Dr. Royann Shivers.  . Coronary artery disease     Coronary bypass graft in 2000 with a LIMA to his LAD, vein to obtuse marginal branch #1 and to the PDA. Myoview stress test 03/10/09 was nonischemic. Myocardial perfusion Imaging -04/24/12 - Low risk stress nuclear study. No evidence of ischemia, however the fixed perfusion defect is more prominent.   . Carotid artery disease     Had a left carotid endarterectomy with Dopplers performed in 03/2011 that showed a widely patent left endarterectomy site with no significant disease on the right.  . Oxygen dependent     Longstanding COPD - wear 4 L per minute  . Diabetes     non-insulin-requiring diabetes  . Cancer     Small cell lung cancer. Received chemotherapy and radiation therapy.   . Duodenitis 01/30/11    Past Surgical History  Procedure Laterality Date  . Pacemaker insertion  2012    For prolonged  sinus pause.  Tod Persia placement4/18/12 tip svc    . Carotid endarterectomy  03/2011    With Dopplers performed most recently in 03/2011 that showed a widely patent left endarterectomy site with no significant disease on the right.  . Coronary artery bypass graft  2000    CAD -  CABG with a LIMA to his LAD, a vein to the obtuse marginal branch #1 and to the PDA  . Cardiac catheterization  06/2010    Unstable angina - Catheterized by Dr. Tresa Endo was noted to have essentially total occlusion of the native coronary arteries, a patent LIMA to the LAD & revealing a 70% stenosis at the origin of his circumflex and obtuse marginal graft, an 80% PLA lesion beyond graft insertion. Stent in distal right coronary artery. Predilated with 2.5 x 20 TREK balloon. Stent-Vision 2.75 x 23 balloon.  . Coronary angioplasty Right 06/2010    Unstable angina - Predilated with a 2.5 x 20 TREK balloon. Distal right coronary artery via  the saphenous vein graft with placement of a bare metal stent (a Vision 2.75 x 23 mm by Dr. Clarene Duke)    JXB:JYNWGNF:AO colds or fevers, no weight changes Skin:no rashes or ulcers HEENT:no blurred vision, no congestion, Deaf in the right ear decreased hearing in the left ear  CV:see HPI PUL:see HPI GI:no diarrhea constipation or melena, no indigestion GU:no hematuria, no dysuria MS:no joint pain, no claudication Neuro:no syncope, no lightheadedness, though when he goes from sitting to standing in the afternoon he has to" get himself together" to keep moving Endo:no diabetes, no thyroid disease  PHYSICAL EXAM BP 122/74  Pulse 56  Ht 5\' 8"  (1.727 m)  Wt 158 lb 14.4 oz (72.077 kg)  BMI 24.17 kg/m2 General:Pleasant affect, NAD Skin:Warm and dry, brisk capillary refill HEENT:normocephalic, sclera clear, mucus membranes moist, hard of hearing Neck:supple, 1 cm JVD at 30 degrees, no bruits  Heart:S1S2 RRR without murmur, gallup, rub or click Lungs:diminished throughout without rales,  rhonchi, or wheezes ZHY:QMVH, non tender, + BS, do not palpate liver spleen or masses Ext:1-2+ lower ext edema bil, could not palpate pedal pulses due to edema,  2+ radial pulses Neuro:alert and oriented, MAE, follows commands, + facial symmetry    ASSESSMENT AND PLAN Bilateral leg edema Continues with 1-2+ bilateral lower extremity edema up to his knees.  With the addition of hydrochlorothiazide and his blood pressure decreases during the day he is slightly orthostatic. I've asked him to hold his losartan HCTZ Wednesday of this week and Friday on those days he will take 40 of Lasix by mouth along with 20 mEq of potassium. On Thursday and thereafter his Friday dose of Lasix he will go back to his losartan HCTZ. Of onset asked him to get support stockings knee high to put on in the morning and take off at night to see if this will also help his lower extremity edema. We reviewed 2000 mg sodium diets as well.  I'll see him back in one month or sooner if the edema does not improve.  Essential hypertension Stable in the morning but dropping to the 90s in the afternoon,  Coronary artery disease, hx of CABG 2000 wth hx of inf. MI prior to CABG Recent nuclear stress test in January was negative for ischemia, considered low risk study a fixed perfusion defect seemed more prominent. LAST echo was January 2014 EF 55-60% mild concentric hypertrophy pulmonary artery PA pressure 42 mmHg mild to moderate tricuspid regurg mild mitral regurg.  No chest pain.  Paroxysmal atrial fibrillation Sinus rhythm on EKG from primary care's office done today. Was positive for PVCs occasional.  Small cell lung cancer Followed by Dr. [name]  COPD (chronic obstructive pulmonary disease) Followed by Dr. [name]

## 2012-11-26 NOTE — Assessment & Plan Note (Signed)
Sinus rhythm on EKG from primary care's office done today. Was positive for PVCs occasional.

## 2012-11-26 NOTE — Assessment & Plan Note (Signed)
Continues with 1-2+ bilateral lower extremity edema up to his knees.  With the addition of hydrochlorothiazide and his blood pressure decreases during the day he is slightly orthostatic. I've asked him to hold his losartan HCTZ Wednesday of this week and Friday on those days he will take 40 of Lasix by mouth along with 20 mEq of potassium. On Thursday and thereafter his Friday dose of Lasix he will go back to his losartan HCTZ. Of onset asked him to get support stockings knee high to put on in the morning and take off at night to see if this will also help his lower extremity edema. We reviewed 2000 mg sodium diets as well.  I'll see him back in one month or sooner if the edema does not improve.

## 2012-11-26 NOTE — Patient Instructions (Addendum)
Wednesday - HOLD losartan/HCtz and TAKE 40 mg lasix (furosemide).   Thursday - TAKE losartan/hctz, NO furosemide.   Friday - TAKE Lasix (furosemide) 40 mg and NO losartan/hctz.   Then resume meds as usual.   In addition to the Lasix on Wednesday and Friday (only for 2 days) take 20 Meq of potassium.  Buy support socks to your knees to wear, put on daily before getting out of bed for best results.  Watch your salt intake only 2000 mg daily.  Call if weight increases or swelling increases.  Your physician recommends that you return for lab work on Thursday. BMP, ProBNP   Follow up with me in a month.      2 Gram Low Sodium Diet A 2 gram sodium diet restricts the amount of sodium in the diet to no more than 2 g or 2000 mg daily. Limiting the amount of sodium is often used to help lower blood pressure. It is important if you have heart, liver, or kidney problems. Many foods contain sodium for flavor and sometimes as a preservative. When the amount of sodium in a diet needs to be low, it is important to know what to look for when choosing foods and drinks. The following includes some information and guidelines to help make it easier for you to adapt to a low sodium diet. QUICK TIPS  Do not add salt to food.  Avoid convenience items and fast food.  Choose unsalted snack foods.  Buy lower sodium products, often labeled as "lower sodium" or "no salt added."  Check food labels to learn how much sodium is in 1 serving.  When eating at a restaurant, ask that your food be prepared with less salt or none, if possible. READING FOOD LABELS FOR SODIUM INFORMATION The nutrition facts label is a good place to find how much sodium is in foods. Look for products with no more than 500 to 600 mg of sodium per meal and no more than 150 mg per serving. Remember that 2 g = 2000 mg. The food label may also list foods as:  Sodium-free: Less than 5 mg in a serving.  Very low sodium: 35 mg or less in a  serving.  Low-sodium: 140 mg or less in a serving.  Light in sodium: 50% less sodium in a serving. For example, if a food that usually has 300 mg of sodium is changed to become light in sodium, it will have 150 mg of sodium.  Reduced sodium: 25% less sodium in a serving. For example, if a food that usually has 400 mg of sodium is changed to reduced sodium, it will have 300 mg of sodium. CHOOSING FOODS Grains  Avoid: Salted crackers and snack items. Some cereals, including instant hot cereals. Bread stuffing and biscuit mixes. Seasoned rice or pasta mixes.  Choose: Unsalted snack items. Low-sodium cereals, oats, puffed wheat and rice, shredded wheat. English muffins and bread. Pasta. Meats  Avoid: Salted, canned, smoked, spiced, pickled meats, including fish and poultry. Bacon, ham, sausage, cold cuts, hot dogs, anchovies.  Choose: Low-sodium canned tuna and salmon. Fresh or frozen meat, poultry, and fish. Dairy  Avoid: Processed cheese and spreads. Cottage cheese. Buttermilk and condensed milk. Regular cheese.  Choose: Milk. Low-sodium cottage cheese. Yogurt. Sour cream. Low-sodium cheese. Fruits and Vegetables  Avoid: Regular canned vegetables. Regular canned tomato sauce and paste. Frozen vegetables in sauces. Olives. Rosita Fire. Relishes. Sauerkraut.  Choose: Low-sodium canned vegetables. Low-sodium tomato sauce and paste. Frozen or fresh vegetables.  Fresh and frozen fruit. Condiments  Avoid: Canned and packaged gravies. Worcestershire sauce. Tartar sauce. Barbecue sauce. Soy sauce. Steak sauce. Ketchup. Onion, garlic, and table salt. Meat flavorings and tenderizers.  Choose: Fresh and dried herbs and spices. Low-sodium varieties of mustard and ketchup. Lemon juice. Tabasco sauce. Horseradish. SAMPLE 2 GRAM SODIUM MEAL PLAN Breakfast / Sodium (mg)  1 cup low-fat milk / 143 mg  2 slices whole-wheat toast / 270 mg  1 tbs heart-healthy margarine / 153 mg  1 hard-boiled egg /  139 mg  1 small orange / 0 mg Lunch / Sodium (mg)  1 cup raw carrots / 76 mg   cup hummus / 298 mg  1 cup low-fat milk / 143 mg   cup red grapes / 2 mg  1 whole-wheat pita bread / 356 mg Dinner / Sodium (mg)  1 cup whole-wheat pasta / 2 mg  1 cup low-sodium tomato sauce / 73 mg  3 oz lean ground beef / 57 mg  1 small side salad (1 cup raw spinach leaves,  cup cucumber,  cup yellow bell pepper) with 1 tsp olive oil and 1 tsp red wine vinegar / 25 mg Snack / Sodium (mg)  1 container low-fat vanilla yogurt / 107 mg  3 graham cracker squares / 127 mg Nutrient Analysis  Calories: 2033  Protein: 77 g  Carbohydrate: 282 g  Fat: 72 g  Sodium: 1971 mg Document Released: 03/27/2005 Document Revised: 06/19/2011 Document Reviewed: 06/28/2009 [employer] Patient Information 2014 [place], [place].

## 2012-11-26 NOTE — Assessment & Plan Note (Signed)
Recent nuclear stress test in January was negative for ischemia, considered low risk study a fixed perfusion defect seemed more prominent. LAST echo was January 2014 EF 55-60% mild concentric hypertrophy pulmonary artery PA pressure 42 mmHg mild to moderate tricuspid regurg mild mitral regurg.  No chest pain.

## 2012-11-26 NOTE — Assessment & Plan Note (Signed)
Stable in the morning but dropping to the 90s in the afternoon,

## 2012-11-28 ENCOUNTER — Ambulatory Visit (HOSPITAL_BASED_OUTPATIENT_CLINIC_OR_DEPARTMENT_OTHER): Payer: Medicare Other

## 2012-11-28 DIAGNOSIS — C3430 Malignant neoplasm of lower lobe, unspecified bronchus or lung: Secondary | ICD-10-CM

## 2012-11-28 DIAGNOSIS — Z452 Encounter for adjustment and management of vascular access device: Secondary | ICD-10-CM

## 2012-11-28 LAB — BASIC METABOLIC PANEL WITH GFR
BUN: 20 mg/dL (ref 6–23)
Chloride: 99 mEq/L (ref 96–112)
GFR, Est Non African American: 65 mL/min
Glucose, Bld: 137 mg/dL — ABNORMAL HIGH (ref 70–99)
Potassium: 4.4 mEq/L (ref 3.5–5.3)

## 2012-11-28 MED ORDER — SODIUM CHLORIDE 0.9 % IJ SOLN
10.0000 mL | INTRAMUSCULAR | Status: DC | PRN
  Administered 2012-11-28: 10 mL via INTRAVENOUS
  Filled 2012-11-28: qty 10

## 2012-11-28 MED ORDER — HEPARIN SOD (PORK) LOCK FLUSH 100 UNIT/ML IV SOLN
500.0000 [IU] | Freq: Once | INTRAVENOUS | Status: AC
  Administered 2012-11-28: 500 [IU] via INTRAVENOUS
  Filled 2012-11-28: qty 5

## 2012-11-28 NOTE — Patient Instructions (Addendum)

## 2012-12-04 ENCOUNTER — Other Ambulatory Visit: Payer: Self-pay | Admitting: Emergency Medicine

## 2012-12-24 ENCOUNTER — Ambulatory Visit (HOSPITAL_COMMUNITY)
Admission: RE | Admit: 2012-12-24 | Discharge: 2012-12-24 | Disposition: A | Discharge status: Home / Self Care | Payer: Medicare Other | Source: Ambulatory Visit | Attending: Internal Medicine | Admitting: Internal Medicine

## 2012-12-24 ENCOUNTER — Other Ambulatory Visit (HOSPITAL_BASED_OUTPATIENT_CLINIC_OR_DEPARTMENT_OTHER): Payer: Medicare Other | Admitting: Lab

## 2012-12-24 ENCOUNTER — Encounter (HOSPITAL_COMMUNITY): Payer: Self-pay

## 2012-12-24 DIAGNOSIS — I700 Atherosclerosis of aorta: Secondary | ICD-10-CM | POA: Insufficient documentation

## 2012-12-24 DIAGNOSIS — Z923 Personal history of irradiation: Secondary | ICD-10-CM | POA: Insufficient documentation

## 2012-12-24 DIAGNOSIS — C787 Secondary malignant neoplasm of liver and intrahepatic bile duct: Secondary | ICD-10-CM | POA: Insufficient documentation

## 2012-12-24 DIAGNOSIS — C3490 Malignant neoplasm of unspecified part of unspecified bronchus or lung: Secondary | ICD-10-CM | POA: Insufficient documentation

## 2012-12-24 DIAGNOSIS — Z951 Presence of aortocoronary bypass graft: Secondary | ICD-10-CM | POA: Insufficient documentation

## 2012-12-24 DIAGNOSIS — Z9221 Personal history of antineoplastic chemotherapy: Secondary | ICD-10-CM | POA: Insufficient documentation

## 2012-12-24 DIAGNOSIS — C3492 Malignant neoplasm of unspecified part of left bronchus or lung: Secondary | ICD-10-CM

## 2012-12-24 DIAGNOSIS — N281 Cyst of kidney, acquired: Secondary | ICD-10-CM | POA: Insufficient documentation

## 2012-12-24 DIAGNOSIS — I2510 Atherosclerotic heart disease of native coronary artery without angina pectoris: Secondary | ICD-10-CM | POA: Insufficient documentation

## 2012-12-24 LAB — COMPREHENSIVE METABOLIC PANEL (CC13)
ALT: 18 U/L (ref 0–55)
AST: 22 U/L (ref 5–34)
Alkaline Phosphatase: 80 U/L (ref 40–150)
CO2: 34 mEq/L — ABNORMAL HIGH (ref 22–29)
Creatinine: 1.1 mg/dL (ref 0.7–1.3)
Sodium: 144 mEq/L (ref 136–145)
Total Bilirubin: 0.55 mg/dL (ref 0.20–1.20)
Total Protein: 6.8 g/dL (ref 6.4–8.3)

## 2012-12-24 LAB — CBC WITH DIFFERENTIAL/PLATELET
BASO%: 0.5 % (ref 0.0–2.0)
EOS%: 4.2 % (ref 0.0–7.0)
LYMPH%: 10.3 % — ABNORMAL LOW (ref 14.0–49.0)
MCH: 27.9 pg (ref 27.2–33.4)
MCHC: 31.7 g/dL — ABNORMAL LOW (ref 32.0–36.0)
MONO#: 0.6 10*3/uL (ref 0.1–0.9)
MONO%: 9.5 % (ref 0.0–14.0)
NEUT%: 75.5 % — ABNORMAL HIGH (ref 39.0–75.0)
Platelets: 154 10*3/uL (ref 140–400)
RBC: 3.15 10*6/uL — ABNORMAL LOW (ref 4.20–5.82)
WBC: 6.2 10*3/uL (ref 4.0–10.3)

## 2012-12-24 MED ORDER — IOHEXOL 300 MG/ML  SOLN
80.0000 mL | Freq: Once | INTRAMUSCULAR | Status: AC | PRN
  Administered 2012-12-24: 80 mL via INTRAVENOUS

## 2012-12-26 ENCOUNTER — Ambulatory Visit (HOSPITAL_BASED_OUTPATIENT_CLINIC_OR_DEPARTMENT_OTHER): Payer: Medicare Other | Admitting: Internal Medicine

## 2012-12-26 ENCOUNTER — Telehealth: Payer: Self-pay | Admitting: Internal Medicine

## 2012-12-26 ENCOUNTER — Encounter: Payer: Self-pay | Admitting: Internal Medicine

## 2012-12-26 VITALS — BP 101/47 | HR 70 | Temp 97.5°F | Resp 20 | Ht 68.0 in | Wt 157.8 lb

## 2012-12-26 DIAGNOSIS — C3492 Malignant neoplasm of unspecified part of left bronchus or lung: Secondary | ICD-10-CM

## 2012-12-26 DIAGNOSIS — C3490 Malignant neoplasm of unspecified part of unspecified bronchus or lung: Secondary | ICD-10-CM

## 2012-12-26 NOTE — Progress Notes (Signed)
[facility] [phone]   [phone]  OFFICE PROGRESS NOTE  ROBERTS, [name], MD [address] [place] [address]  PRINCIPAL DIAGNOSIS: questionable recurrent small cell lung cancer initially diagnosed as Limited-stage small cell lung cancer diagnosed in April of 2012.   PRIOR THERAPY:  1. Status post systemic chemotherapy with carboplatin and etoposide, last dose was given 09/30/2010. This was concurrent with radiotherapy under the care of Dr. [name]. 2. Status post prophylactic cranial irradiation completed on November 29, 2010.  CURRENT THERAPY: Observation.   INTERVAL HISTORY: [name] 79 y.o. male returns to the clinic today for follow up visit accompanied by his wife, son and daughter-in-law.the patient is feeling fine today except for the baseline shortness breath and he is currently on home oxygen in addition to fatigue and mild cough. He denied having any significant chest pain or hemoptysis. The patient denied having any significant weight loss or night sweats. He has no nausea or vomiting. He has repeat CT scan of the chest performed recently and he is here for evaluation and discussion of his scan results.  MEDICAL HISTORY: Past Medical History  Diagnosis Date  . Hypertension   . Heart attack 1991    Inferior- wall MI  treated with emergency PTCA with RCA by Dr. Allyson Sabal, with a perfusion time of 1 hour 55 minutes.  . Emphysema   . Hyperlipidemia   . Allergic rhinitis   . Heart failure   . Cholelithiasis   . Cardiac pacemaker     Dule-chamber permanent pacemaker - St[employer] Jude Accent DR RF device model # [id number], serial # [id number].  Marland Kitchen COPD (chronic obstructive pulmonary disease)     Longstanding COPD. Uses oxygen - 4 L per minute.  . SOB (shortness of breath)     Transthoracic Echo 04/24/12 EF = 55-60%. Showed sinus rhythm with left atrial abnormality, left axis deviation, and almost an atypical pulmonary disease  pattern.   . Paroxysmal a-fib     Permanent Transvenous pacemaker insertion by Dr. Lynnea Ferrier in 2012 and is followed by Dr. Royann Shivers.  . Coronary artery disease     Coronary bypass graft in 2000 with a LIMA to his LAD, vein to obtuse marginal branch #1 and to the PDA. Myoview stress test 03/10/09 was nonischemic. Myocardial perfusion Imaging -04/24/12 - Low risk stress nuclear study. No evidence of ischemia, however the fixed perfusion defect is more prominent.   . Carotid artery disease     Had a left carotid endarterectomy with Dopplers performed in 03/2011 that showed a widely patent left endarterectomy site with no significant disease on the right.  . Oxygen dependent     Longstanding COPD - wear 4 L per minute  . Diabetes     non-insulin-requiring diabetes  . Cancer     Small cell lung cancer. Received chemotherapy and radiation therapy.   . Duodenitis 01/30/11    ALLERGIES:  has No Known Allergies.  MEDICATIONS:  Current Outpatient Prescriptions  Medication Sig Dispense Refill  . aspirin EC 325 MG tablet Take 325 mg by mouth daily.       . calcium acetate (PHOSLO) 667 MG capsule Take 667 mg by mouth.      . Chlorphen-Pseudoephed-APAP (CORICIDIN D PO) Take 2 tablets by mouth daily.      . clopidogrel (PLAVIX) 75 MG tablet Take 1 tablet by mouth daily.      . feeding supplement (GLUCERNA SHAKE) LIQD Take  237 mLs by mouth 2 (two) times daily before a meal.        . Fluticasone-Salmeterol (ADVAIR DISKUS) 100-50 MCG/DOSE AEPB Inhale 1 puff into the lungs 2 (two) times daily.  60 each  5  . furosemide (LASIX) 40 MG tablet Take 1 tablet (40 mg total) by mouth daily.  10 tablet  0  . isosorbide mononitrate (IMDUR) 30 MG 24 hr tablet Take 1 tablet by mouth daily.      Marland Kitchen loratadine (CLARITIN) 10 MG tablet Take 1 tablet (10 mg total) by mouth daily.  30 tablet  4  . losartan-hydrochlorothiazide (HYZAAR) 50-12.5 MG per tablet Take 1 tablet by mouth daily.  90 tablet  3  . metoprolol tartrate  (LOPRESSOR) 25 MG tablet Take 25 mg by mouth. 12.5mg  BID      . Multiple Vitamin (MULTIVITAMIN) tablet Take 1 tablet by mouth daily.        . OXYGEN-HELIUM IN Inhale 6 L into the lungs continuous.      . potassium chloride SA (K-DUR,KLOR-CON) 20 MEQ tablet Take 1 tablet (20 mEq total) by mouth daily.  10 tablet  0  . simvastatin (ZOCOR) 20 MG tablet Take 20 mg by mouth at bedtime.        Marland Kitchen tiotropium (SPIRIVA HANDIHALER) 18 MCG inhalation capsule Place 1 capsule (18 mcg total) into inhaler and inhale daily.  30 capsule  5  . VENTOLIN HFA 108 (90 BASE) MCG/ACT inhaler INHALE TWO PUFFS EVERY 4 HOURS AS NEEDED FOR WHEEZING  18 each  6  . acetaminophen (TYLENOL) 325 MG tablet Take 650 mg by mouth every 6 (six) hours as needed.        . DiphenhydrAMINE HCl (BENADRYL ALLERGY PO) Take 1 tablet by mouth as needed.      . nitroGLYCERIN (NITROSTAT) 0.4 MG SL tablet Place 0.4 mg under the tongue every 5 (five) minutes as needed.        Marland Kitchen Phenyleph-CPM-DM-Aspirin (ALKA-SELTZER PLUS COLD & COUGH PO) Take 2 tablets by mouth daily.      Marland Kitchen UNABLE TO FIND Med Name: prostate supplement      . [DISCONTINUED] pantoprazole (PROTONIX) 40 MG tablet Take 40 mg by mouth daily.        No current facility-administered medications for this visit.    SURGICAL HISTORY:  Past Surgical History  Procedure Laterality Date  . Pacemaker insertion  2012    For prolonged sinus pause.  Tod Persia placement4/18/12 tip svc    . Carotid endarterectomy  03/2011    With Dopplers performed most recently in 03/2011 that showed a widely patent left endarterectomy site with no significant disease on the right.  . Coronary artery bypass graft  2000    CAD - CABG with a LIMA to his LAD, a vein to the obtuse marginal branch #1 and to the PDA  . Cardiac catheterization  06/2010    Unstable angina - Catheterized by Dr. Tresa Endo was noted to have essentially total occlusion of the native coronary arteries, a patent LIMA to the LAD & revealing a  70% stenosis at the origin of his circumflex and obtuse marginal graft, an 80% PLA lesion beyond graft insertion. Stent in distal right coronary artery. Predilated with 2.5 x 20 TREK balloon. Stent-Vision 2.75 x 23 balloon.  . Coronary angioplasty Right 06/2010    Unstable angina - Predilated with a 2.5 x 20 TREK balloon. Distal right coronary artery via  the saphenous vein graft with placement of a  bare metal stent (a Vision 2.75 x 23 mm by Dr. Clarene Duke)    REVIEW OF SYSTEMS:  Constitutional: positive for fatigue Eyes: negative Ears, nose, mouth, throat, and face: negative Respiratory: positive for cough and dyspnea on exertion Cardiovascular: negative Gastrointestinal: negative Genitourinary:negative Integument/breast: negative Hematologic/lymphatic: negative Musculoskeletal:positive for muscle weakness Neurological: negative Behavioral/Psych: negative Endocrine: negative Allergic/Immunologic: negative   PHYSICAL EXAMINATION: General appearance: alert, cooperative, fatigued and no distress Head: Normocephalic, without obvious abnormality, atraumatic Neck: no adenopathy, no JVD, supple, symmetrical, trachea midline and thyroid not enlarged, symmetric, no tenderness/mass/nodules Lymph nodes: Cervical, supraclavicular, and axillary nodes normal. Resp: clear to auscultation bilaterally and normal percussion bilaterally Back: symmetric, no curvature. ROM normal. No CVA tenderness. Cardio: regular rate and rhythm, S1, S2 normal, no murmur, click, rub or gallop GI: soft, non-tender; bowel sounds normal; no masses,  no organomegaly Extremities: extremities normal, atraumatic, no cyanosis or edema Neurologic: Alert and oriented X 3, normal strength and tone. Normal symmetric reflexes. Normal coordination and gait  ECOG PERFORMANCE STATUS: 2 - Symptomatic, <50% confined to bed  Blood pressure 101/47, pulse 70, temperature 97.5 F (36.4 C), temperature source Oral, resp. rate 20, height 5\' 8"   (1.727 m), weight 157 lb 12.8 oz (71.578 kg).  LABORATORY DATA: Lab Results  Component Value Date   WBC 6.2 12/24/2012   HGB 8.8* 12/24/2012   HCT 27.8* 12/24/2012   MCV 88.0 12/24/2012   PLT 154 12/24/2012      Chemistry      Component Value Date/Time   NA 144 12/24/2012 0841   NA 138 11/28/2012 1152   NA 137 11/21/2011 0945   K 4.3 12/24/2012 0841   K 4.4 11/28/2012 1152   K 4.5 11/21/2011 0945   CL 99 11/28/2012 1152   CL 100 06/21/2012 0912   CL 96* 11/21/2011 0945   CO2 34* 12/24/2012 0841   CO2 32 11/28/2012 1152   CO2 31 11/21/2011 0945   BUN 21.3 12/24/2012 0841   BUN 20 11/28/2012 1152   BUN 18 11/21/2011 0945   CREATININE 1.1 12/24/2012 0841   CREATININE 1.08 11/28/2012 1152   CREATININE 0.89 02/22/2011 1411      Component Value Date/Time   CALCIUM 9.5 12/24/2012 0841   CALCIUM 9.3 11/28/2012 1152   CALCIUM 9.4 11/21/2011 0945   CALCIUM 7.6* 09/18/2010 1136   ALKPHOS 80 12/24/2012 0841   ALKPHOS 50 11/21/2011 0945   ALKPHOS 70 02/22/2011 1411   AST 22 12/24/2012 0841   AST 24 11/21/2011 0945   AST 13 02/22/2011 1411   ALT 18 12/24/2012 0841   ALT 26 11/21/2011 0945   ALT 9 02/22/2011 1411   BILITOT 0.55 12/24/2012 0841   BILITOT 1.10 11/21/2011 0945   BILITOT 0.5 02/22/2011 1411       RADIOGRAPHIC STUDIES: Ct Chest W Contrast  12/24/2012   CLINICAL DATA:  Lung cancer diagnosed in October 2012. Chemotherapy and radiation therapy completed.  EXAM: CT CHEST WITH CONTRAST  TECHNIQUE: Multidetector CT imaging of the chest was performed during intravenous contrast administration.  CONTRAST:  80mL OMNIPAQUE IOHEXOL 300 MG/ML  SOLN  COMPARISON:  Chest CTs dated 06/21/2012 and 02/22/2012.  FINDINGS: There are no enlarged mediastinal or hilar lymph nodes. Diffuse atherosclerosis of the aorta, great vessels and coronary arteries is again noted status post CABG. Right subclavian pacemaker leads appear unchanged.  There is no pleural or pericardial effusion. Left upper lobe scarring and  bronchiectasis are stable, consistent with sequela of prior radiation therapy. There has  been interval enlargement of the spiculated nodule involving the superior segment of the left lower lobe, now measuring 11 x 15 mm on image 12. No other enlarging pulmonary nodules are identified. Extensive bullous emphysematous changes are again noted at both lung bases.  There has been interval enlargement of the previously described lesion involving the dome of the right hepatic lobe. This now measures 4.2 x 3.5 cm and demonstrate central low density and contiguous peripheral enhancement consistent with a metastasis. In addition, there are multiple additional hepatic metastases, including a 3.2 cm lesion in the left lobe on image 60 to and a 2.9 cm lesion in the right lobe on image 66. A right renal cyst and bilateral adrenal nodularity are stable. There are no worrisome osseous findings.  IMPRESSION: 1. Interval development of widespread hepatic metastatic disease.  2. No other evidence of metastatic disease identified.  3. Enlarging spiculated lesion involving the superior segment of the left lower lobe. Given the proximity to the presumed radiation changes in left upper lobe, this could reflect postradiation scarring or mucoid impaction. However, the appearance is concerning for a metachronous primary lung cancer.   Electronically Signed   By: Roxy Horseman   On: 12/24/2012 10:20    ASSESSMENT AND PLAN: this is a very pleasant 79 years old white male with history of limited stage small cell lung cancer diagnosed in April 2012 status post systemic chemotherapy with carboplatin and etoposide concurrent with radiation followed by prophylactic cranial irradiation. The patient has been on observation for the last 2 years but unfortunately the recent scan of the chest showed interval development of widespread hepatic metastasis suspicious for metastatic disease to the liver. I have a lengthy discussion with the patient and  his family. I showed them the images of the CT scan of the chest. I recommended for the patient to proceed with ultrasound guided core biopsy of one of the liver lesion for tissue diagnosis confirmation. If it is consistent with small cell lung cancer, I will complete the staging workup I ordered an CT scan of the abdomen and pelvis in addition to brain MRI, then I would consider the patient for systemic chemotherapy with carboplatin and etoposide. I gave the patient and his family the time to ask questions and I answered them completely to their satisfaction. The patient would come back for follow up visit in around 10 days for evaluation and discussion of his biopsy results and further recommendation regarding treatment of his condition.  The patient voices understanding of current disease status and treatment options and is in agreement with the current care plan.  All questions were answered. The patient knows to call the clinic with any problems, questions or concerns. We can certainly see the patient much sooner if necessary.  I spent 15 minutes counseling the patient face to face. The total time spent in the appointment was 25 minutes.

## 2012-12-26 NOTE — Telephone Encounter (Signed)
Gave pt appt for 9/30 lab and MD

## 2012-12-27 ENCOUNTER — Telehealth: Payer: Self-pay | Admitting: Emergency Medicine

## 2012-12-27 ENCOUNTER — Telehealth: Payer: Self-pay | Admitting: *Deleted

## 2012-12-27 ENCOUNTER — Other Ambulatory Visit: Payer: Self-pay | Admitting: Emergency Medicine

## 2012-12-27 MED ORDER — FLUTICASONE-SALMETEROL 100-50 MCG/DOSE IN AEPB: 1.0000 | INHALATION_SPRAY | Freq: Two times a day (BID) | RESPIRATORY_TRACT | Status: AC

## 2012-12-27 MED ORDER — TIOTROPIUM BROMIDE MONOHYDRATE 18 MCG IN CAPS: 18.0000 ug | ORAL_CAPSULE | Freq: Every day | RESPIRATORY_TRACT | Status: AC

## 2012-12-27 NOTE — Telephone Encounter (Signed)
Tiffany stated pt needs to have an ultrasound core biopsy on 9.26.14.  Stated pt needs to hold her Plavix for 5 days, starting on Sunday.  Wants to know if this is okay.  Informed Dr. Allyson Sabal is out of the office this week and a provider will be notified to review chart for further instructions.  Verbalized understanding and asked that RN call her back when instructions given.  Phone: [phone].  Message forwarded to B. Leron Croak, PA-C for further instructions.  Need response today.

## 2012-12-27 NOTE — Telephone Encounter (Signed)
Ok to hold Plavix for five days starting Sunday, Sept 21.  Restart after procedure and when bleeding risk is low.  Thorin Starner PA-C 10:35 AM

## 2012-12-27 NOTE — Telephone Encounter (Signed)
Spoke with patients spouse-- Requesting refills on Spiriva and Advair Rx has been sent to verified pharmacy.  Spouse would also like Dr. [name] to be aware that patient had CT done yesterday and now has mets to liver. Will forward to RB as FYI

## 2012-12-28 NOTE — Patient Instructions (Signed)
I ordered ultrasound guided core biopsy of one of the liver lesion.  Followup visit in 10 days for evaluation and discussion of his biopsy results and further recommendation regarding treatment of her condition.

## 2012-12-30 ENCOUNTER — Other Ambulatory Visit: Payer: Self-pay | Admitting: Radiology

## 2012-12-31 ENCOUNTER — Encounter (HOSPITAL_COMMUNITY): Payer: Self-pay | Admitting: Pharmacy Technician

## 2012-12-31 NOTE — Telephone Encounter (Signed)
Thank you for letting me know

## 2012-12-31 NOTE — Telephone Encounter (Signed)
Will sign and forward to RB as FYI

## 2013-01-03 ENCOUNTER — Encounter (HOSPITAL_COMMUNITY): Payer: Self-pay

## 2013-01-03 ENCOUNTER — Ambulatory Visit (HOSPITAL_COMMUNITY)
Admission: RE | Admit: 2013-01-03 | Discharge: 2013-01-03 | Disposition: A | Discharge status: Home / Self Care | Payer: Medicare Other | Source: Ambulatory Visit | Attending: Internal Medicine | Admitting: Internal Medicine

## 2013-01-03 ENCOUNTER — Other Ambulatory Visit: Payer: Self-pay | Admitting: Internal Medicine

## 2013-01-03 DIAGNOSIS — C3492 Malignant neoplasm of unspecified part of left bronchus or lung: Secondary | ICD-10-CM

## 2013-01-03 DIAGNOSIS — C787 Secondary malignant neoplasm of liver and intrahepatic bile duct: Secondary | ICD-10-CM | POA: Insufficient documentation

## 2013-01-03 DIAGNOSIS — C3490 Malignant neoplasm of unspecified part of unspecified bronchus or lung: Secondary | ICD-10-CM | POA: Insufficient documentation

## 2013-01-03 LAB — CBC
HCT: 26.3 % — ABNORMAL LOW (ref 39.0–52.0)
Hemoglobin: 8 g/dL — ABNORMAL LOW (ref 13.0–17.0)
MCV: 90.4 fL (ref 78.0–100.0)
RBC: 2.91 MIL/uL — ABNORMAL LOW (ref 4.22–5.81)
WBC: 6.3 10*3/uL (ref 4.0–10.5)

## 2013-01-03 LAB — PROTIME-INR: INR: 1.01 (ref 0.00–1.49)

## 2013-01-03 MED ORDER — FENTANYL CITRATE 0.05 MG/ML IJ SOLN
INTRAMUSCULAR | Status: AC | PRN
  Administered 2013-01-03: 25 ug via INTRAVENOUS
  Administered 2013-01-03: 50 ug via INTRAVENOUS
  Administered 2013-01-03: 25 ug via INTRAVENOUS

## 2013-01-03 MED ORDER — MIDAZOLAM HCL 2 MG/2ML IJ SOLN
INTRAMUSCULAR | Status: AC
  Filled 2013-01-03: qty 6

## 2013-01-03 MED ORDER — MIDAZOLAM HCL 2 MG/2ML IJ SOLN
INTRAMUSCULAR | Status: AC | PRN
  Administered 2013-01-03: 1 mg via INTRAVENOUS
  Administered 2013-01-03 (×2): 0.5 mg via INTRAVENOUS

## 2013-01-03 MED ORDER — HEPARIN SOD (PORK) LOCK FLUSH 100 UNIT/ML IV SOLN
500.0000 [IU] | INTRAVENOUS | Status: AC | PRN
  Administered 2013-01-03: 500 [IU]
  Filled 2013-01-03: qty 5

## 2013-01-03 MED ORDER — SODIUM CHLORIDE 0.9 % IV SOLN
INTRAVENOUS | Status: DC
  Administered 2013-01-03: 12:00:00 via INTRAVENOUS

## 2013-01-03 MED ORDER — FENTANYL CITRATE 0.05 MG/ML IJ SOLN
INTRAMUSCULAR | Status: AC
  Filled 2013-01-03: qty 6

## 2013-01-03 NOTE — H&P (Signed)
[name] is an 79 y.o. male.   Chief Complaint: "I'm having a liver biopsy" HPI: Patient with history of small cell lung carcinoma and recent CT revealing multiple liver lesions presents today for US guided liver lesion biopsy.  Past Medical History  Diagnosis Date  . Hypertension   . Heart attack 1991    Inferior- wall MI  treated with emergency PTCA with RCA by Dr. Allyson Sabal, with a perfusion time of 1 hour 55 minutes.  . Emphysema   . Hyperlipidemia   . Allergic rhinitis   . Heart failure   . Cholelithiasis   . Cardiac pacemaker     Dule-chamber permanent pacemaker - St. Jude [employer] DR RF device model # [id number], serial # [id number].  Marland Kitchen COPD (chronic obstructive pulmonary disease)     Longstanding COPD. Uses oxygen - 4 L per minute.  . SOB (shortness of breath)     Transthoracic Echo 04/24/12 EF = 55-60%. Showed sinus rhythm with left atrial abnormality, left axis deviation, and almost an atypical pulmonary disease pattern.   . Paroxysmal a-fib     Permanent Transvenous pacemaker insertion by Dr. Lynnea Ferrier in 2012 and is followed by Dr. Royann Shivers.  . Coronary artery disease     Coronary bypass graft in 2000 with a LIMA to his LAD, vein to obtuse marginal branch #1 and to the PDA. Myoview stress test 03/10/09 was nonischemic. Myocardial perfusion Imaging -04/24/12 - Low risk stress nuclear study. No evidence of ischemia, however the fixed perfusion defect is more prominent.   . Carotid artery disease     Had a left carotid endarterectomy with Dopplers performed in 03/2011 that showed a widely patent left endarterectomy site with no significant disease on the right.  . Oxygen dependent     Longstanding COPD - wear 4 L per minute  . Diabetes     non-insulin-requiring diabetes  . Cancer     Small cell lung cancer. Received chemotherapy and radiation therapy.   . Duodenitis 01/30/11    Past Surgical History  Procedure Laterality Date  . Pacemaker insertion  2012    For prolonged sinus  pause.  Tod Persia placement4/18/12 tip svc    . Carotid endarterectomy  03/2011    With Dopplers performed most recently in 03/2011 that showed a widely patent left endarterectomy site with no significant disease on the right.  . Coronary artery bypass graft  2000    CAD - CABG with a LIMA to his LAD, a vein to the obtuse marginal branch #1 and to the PDA  . Cardiac catheterization  06/2010    Unstable angina - Catheterized by Dr. Tresa Endo was noted to have essentially total occlusion of the native coronary arteries, a patent LIMA to the LAD & revealing a 70% stenosis at the origin of his circumflex and obtuse marginal graft, an 80% PLA lesion beyond graft insertion. Stent in distal right coronary artery. Predilated with 2.5 x 20 TREK balloon. Stent-Vision 2.75 x 23 balloon.  . Coronary angioplasty Right 06/2010    Unstable angina - Predilated with a 2.5 x 20 TREK balloon. Distal right coronary artery via  the saphenous vein graft with placement of a bare metal stent (a Vision 2.75 x 23 mm by Dr. Clarene Duke)    Family History  Problem Relation Age of Onset  . Coronary artery disease Mother   . Coronary artery disease Father   . Coronary artery disease Sister    Social History:  reports that he quit  smoking about 6 years ago. His smoking use included Cigarettes. He has a 100 pack-year smoking history. He has never used smokeless tobacco. He reports that  drinks alcohol. His drug history is not on file.  Allergies: No Known Allergies  Current outpatient prescriptions:albuterol (PROVENTIL HFA;VENTOLIN HFA) 108 (90 BASE) MCG/ACT inhaler, Inhale 2 puffs into the lungs every 6 (six) hours as needed for wheezing., Disp: , Rfl: ;  Chlorphen-Pseudoephed-APAP (CORICIDIN D PO), Take 2 tablets by mouth daily as needed. , Disp: , Rfl: ;  DiphenhydrAMINE HCl (BENADRYL ALLERGY PO), Take 1 tablet by mouth as needed., Disp: , Rfl:  Fluticasone-Salmeterol (ADVAIR DISKUS) 100-50 MCG/DOSE AEPB, Inhale 1 puff into the  lungs 2 (two) times daily., Disp: 60 each, Rfl: 5;  isosorbide mononitrate (IMDUR) 30 MG 24 hr tablet, Take 1 tablet by mouth every morning. , Disp: , Rfl: ;  losartan-hydrochlorothiazide (HYZAAR) 50-12.5 MG per tablet, Take 1 tablet by mouth daily., Disp: 90 tablet, Rfl: 3 metoprolol tartrate (LOPRESSOR) 25 MG tablet, Take 12.5 mg by mouth 2 (two) times daily. 12.5mg  BID, Disp: , Rfl: ;  Multiple Vitamin (MULTIVITAMIN) tablet, Take 1 tablet by mouth daily.  , Disp: , Rfl: ;  acetaminophen (TYLENOL) 325 MG tablet, Take 650 mg by mouth every 6 (six) hours as needed.  , Disp: , Rfl: ;  aspirin EC 325 MG tablet, Take 325 mg by mouth daily. , Disp: , Rfl:  calcium acetate (PHOSLO) 667 MG capsule, Take 667 mg by mouth., Disp: , Rfl: ;  clopidogrel (PLAVIX) 75 MG tablet, Take 1 tablet by mouth daily., Disp: , Rfl: ;  feeding supplement (GLUCERNA SHAKE) LIQD, Take 237 mLs by mouth 2 (two) times daily before a meal.  , Disp: , Rfl: ;  loratadine (CLARITIN) 10 MG tablet, Take 1 tablet (10 mg total) by mouth daily., Disp: 30 tablet, Rfl: 4 nitroGLYCERIN (NITROSTAT) 0.4 MG SL tablet, Place 0.4 mg under the tongue every 5 (five) minutes as needed.  , Disp: , Rfl: ;  OXYGEN-HELIUM IN, Inhale 6 L into the lungs continuous., Disp: , Rfl: ;  Phenyleph-CPM-DM-Aspirin (ALKA-SELTZER PLUS COLD & COUGH PO), Take 2 tablets by mouth daily., Disp: , Rfl: ;  potassium chloride SA (K-DUR,KLOR-CON) 20 MEQ tablet, Take 1 tablet (20 mEq total) by mouth daily., Disp: 10 tablet, Rfl: 0 simvastatin (ZOCOR) 20 MG tablet, Take 20 mg by mouth at bedtime.  , Disp: , Rfl: ;  tiotropium (SPIRIVA HANDIHALER) 18 MCG inhalation capsule, Place 1 capsule (18 mcg total) into inhaler and inhale daily., Disp: 30 capsule, Rfl: 5;  UNABLE TO FIND, Med Name: prostate supplement, Disp: , Rfl: ;  [DISCONTINUED] pantoprazole (PROTONIX) 40 MG tablet, Take 40 mg by mouth daily. , Disp: , Rfl:  Current facility-administered medications:0.9 %  sodium chloride  infusion, , Intravenous, Continuous, D Jeananne Rama, PA-C, Last Rate: 20 mL/hr at 01/03/13 1224;  heparin lock flush 100 unit/mL, 500 Units, Intracatheter, Prior to discharge, Simonne Come, MD   Results for orders placed in visit on 12/24/12  CBC WITH DIFFERENTIAL      Result Value Range   WBC 6.2  4.0 - 10.3 10e3/uL   NEUT# 4.6  1.5 - 6.5 10e3/uL   HGB 8.8 (*) 13.0 - 17.1 g/dL   HCT 08.6 (*) 57.8 - 46.9 %   Platelets 154  140 - 400 10e3/uL   MCV 88.0  79.3 - 98.0 fL   MCH 27.9  27.2 - 33.4 pg   MCHC 31.7 (*) 32.0 -  36.0 g/dL   RBC 1.61 (*) 0.96 - 0.45 10e6/uL   RDW 14.7 (*) 11.0 - 14.6 %   lymph# 0.6 (*) 0.9 - 3.3 10e3/uL   MONO# 0.6  0.1 - 0.9 10e3/uL   Eosinophils Absolute 0.3  0.0 - 0.5 10e3/uL   Basophils Absolute 0.0  0.0 - 0.1 10e3/uL   NEUT% 75.5 (*) 39.0 - 75.0 %   LYMPH% 10.3 (*) 14.0 - 49.0 %   MONO% 9.5  0.0 - 14.0 %   EOS% 4.2  0.0 - 7.0 %   BASO% 0.5  0.0 - 2.0 %  COMPREHENSIVE METABOLIC PANEL (CC13)      Result Value Range   Sodium 144  136 - 145 mEq/L   Potassium 4.3  3.5 - 5.1 mEq/L   Chloride 102  98 - 109 mEq/L   CO2 34 (*) 22 - 29 mEq/L   Glucose 110  70 - 140 mg/dl   BUN 40.9  7.0 - 81.1 mg/dL   Creatinine 1.1  0.7 - 1.3 mg/dL   Total Bilirubin 9.14  0.20 - 1.20 mg/dL   Alkaline Phosphatase 80  40 - 150 U/L   AST 22  5 - 34 U/L   ALT 18  0 - 55 U/L   Total Protein 6.8  6.4 - 8.3 g/dL   Albumin 3.7  3.5 - 5.0 g/dL   Calcium 9.5  8.4 - 78.2 mg/dL   9/56/21 labs pending Review of Systems  Constitutional: Negative for fever and chills.  Respiratory: Positive for cough and shortness of breath. Negative for hemoptysis.   Cardiovascular: Negative for chest pain.  Gastrointestinal: Negative for nausea, vomiting and abdominal pain.  Musculoskeletal: Negative for back pain.  Neurological: Negative for headaches.   Vitals:  BP 129/70  HR 63  R 16  TEMP 97.9  O2 SATS 100% RA Physical Exam  Constitutional: He is oriented to person, place, and time. He  appears well-developed and well-nourished.  Cardiovascular: Normal rate and regular rhythm.   Has rt chest wall pacer  Respiratory: Effort normal.  Distant BS bilat,> on left; intact left chest wall PAC  GI: Soft. Bowel sounds are normal. There is no tenderness.  Musculoskeletal: Normal range of motion. He exhibits edema.  Neurological: He is alert and oriented to person, place, and time.     Assessment/Plan Pt with hx of small cell carcinoma and multiple liver lesions noted on recent CT. Plan is for US guided liver lesion biopsy today. Details/risks of procedure d/w pt/family with their understanding and consent.  Wavie Hashimi,D KEVIN 01/03/2013, 12:41 PM

## 2013-01-03 NOTE — Procedures (Signed)
Technically successful US guided biopsy of mixed echogenic solid mass within the caudal aspect of the right lobe of the liver.  No immediate complications.

## 2013-01-07 ENCOUNTER — Ambulatory Visit (HOSPITAL_BASED_OUTPATIENT_CLINIC_OR_DEPARTMENT_OTHER): Payer: Medicare Other | Admitting: Internal Medicine

## 2013-01-07 ENCOUNTER — Other Ambulatory Visit: Payer: Self-pay | Admitting: Medical Oncology

## 2013-01-07 ENCOUNTER — Telehealth: Payer: Self-pay | Admitting: Internal Medicine

## 2013-01-07 ENCOUNTER — Ambulatory Visit: Payer: Medicare Other | Admitting: Lab

## 2013-01-07 ENCOUNTER — Ambulatory Visit (HOSPITAL_COMMUNITY)
Admission: RE | Admit: 2013-01-07 | Discharge: 2013-01-07 | Disposition: A | Discharge status: Home / Self Care | Payer: Medicare Other | Source: Ambulatory Visit | Attending: Internal Medicine | Admitting: Internal Medicine

## 2013-01-07 ENCOUNTER — Encounter: Payer: Self-pay | Admitting: Internal Medicine

## 2013-01-07 ENCOUNTER — Other Ambulatory Visit (HOSPITAL_BASED_OUTPATIENT_CLINIC_OR_DEPARTMENT_OTHER): Payer: Medicare Other | Admitting: Lab

## 2013-01-07 VITALS — BP 105/47 | HR 63 | Temp 97.8°F | Resp 20 | Ht 68.0 in | Wt 156.4 lb

## 2013-01-07 DIAGNOSIS — D649 Anemia, unspecified: Secondary | ICD-10-CM

## 2013-01-07 DIAGNOSIS — C3491 Malignant neoplasm of unspecified part of right bronchus or lung: Secondary | ICD-10-CM

## 2013-01-07 DIAGNOSIS — C787 Secondary malignant neoplasm of liver and intrahepatic bile duct: Secondary | ICD-10-CM

## 2013-01-07 DIAGNOSIS — C3492 Malignant neoplasm of unspecified part of left bronchus or lung: Secondary | ICD-10-CM

## 2013-01-07 DIAGNOSIS — C3410 Malignant neoplasm of upper lobe, unspecified bronchus or lung: Secondary | ICD-10-CM

## 2013-01-07 LAB — COMPREHENSIVE METABOLIC PANEL (CC13)
BUN: 18.5 mg/dL (ref 7.0–26.0)
CO2: 30 mEq/L — ABNORMAL HIGH (ref 22–29)
Chloride: 100 mEq/L (ref 98–109)
Glucose: 128 mg/dl (ref 70–140)
Sodium: 140 mEq/L (ref 136–145)
Total Bilirubin: 0.52 mg/dL (ref 0.20–1.20)
Total Protein: 6.5 g/dL (ref 6.4–8.3)

## 2013-01-07 LAB — CBC WITH DIFFERENTIAL/PLATELET
Basophils Absolute: 0 10*3/uL (ref 0.0–0.1)
Eosinophils Absolute: 0.2 10*3/uL (ref 0.0–0.5)
HCT: 24.4 % — ABNORMAL LOW (ref 38.4–49.9)
HGB: 7.7 g/dL — ABNORMAL LOW (ref 13.0–17.1)
LYMPH%: 9.1 % — ABNORMAL LOW (ref 14.0–49.0)
MCV: 85.7 fL (ref 79.3–98.0)
MONO#: 0.5 10*3/uL (ref 0.1–0.9)
MONO%: 8.8 % (ref 0.0–14.0)
NEUT#: 4.4 10*3/uL (ref 1.5–6.5)
NEUT%: 78.4 % — ABNORMAL HIGH (ref 39.0–75.0)
Platelets: 156 10*3/uL (ref 140–400)
RBC: 2.85 10*6/uL — ABNORMAL LOW (ref 4.20–5.82)
WBC: 5.6 10*3/uL (ref 4.0–10.3)
lymph#: 0.5 10*3/uL — ABNORMAL LOW (ref 0.9–3.3)

## 2013-01-07 NOTE — Progress Notes (Signed)
HAR done

## 2013-01-07 NOTE — Patient Instructions (Signed)
CURRENT THERAPY: Systemic chemotherapy again with carboplatin for AUC of 5 on day 1 and etoposide 100 mg/M2 on days 1, 2 and 3 with Neulasta support on day 4. First dose on 01/14/2013.  CHEMOTHERAPY INTENT: Palliative  CURRENT # OF CHEMOTHERAPY CYCLES:1  CURRENT ANTIEMETICS: Zofran, dexamethasone and Compazine  CURRENT SMOKING STATUS: Former smoker  ORAL CHEMOTHERAPY AND CONSENT: None  CURRENT BISPHOSPHONATES USE: None  PAIN MANAGEMENT: 0/10  NARCOTICS INDUCED CONSTIPATION: None  LIVING WILL AND CODE STATUS: ?

## 2013-01-07 NOTE — Telephone Encounter (Signed)
Gave pt appt for lab, md, chemo and injections for October 2014

## 2013-01-07 NOTE — Progress Notes (Signed)
[facility] [phone]   [phone]  OFFICE PROGRESS NOTE  Barker, Keith Ammons, MD [address] [place] [address]  PRINCIPAL DIAGNOSIS: Recurrent small cell lung cancer initially diagnosed as Limited-stage small cell lung cancer diagnosed in April of 2012.   PRIOR THERAPY:  1. Status post systemic chemotherapy with carboplatin and etoposide, last dose was given 09/30/2010. This was concurrent with radiotherapy under the care of Dr. Michell Barker. 2. Status post prophylactic cranial irradiation completed on November 29, 2010.  CURRENT THERAPY: Systemic chemotherapy again with carboplatin for AUC of 5 on day 1 and etoposide 100 mg/M2 on days 1, 2 and 3 with Neulasta support on day 4. First dose on 01/14/2013.  CHEMOTHERAPY INTENT: Palliative  CURRENT # OF CHEMOTHERAPY CYCLES:1  CURRENT ANTIEMETICS: Zofran, dexamethasone and Compazine  CURRENT SMOKING STATUS: Former smoker  ORAL CHEMOTHERAPY AND CONSENT: None  CURRENT BISPHOSPHONATES USE: None  PAIN MANAGEMENT: 0/10  NARCOTICS INDUCED CONSTIPATION: None  LIVING WILL AND CODE STATUS: ?   INTERVAL HISTORY: [name] 79 y.o. male returns to the clinic today for followup visit accompanied by his wife, son and daughter-in-law. The patient is feeling fine today except for the baseline shortness of breath and he is currently on home oxygen. He denied having any significant chest pain, cough or hemoptysis. He has no recent weight loss. Has no nausea or vomiting. The patient underwent ultrasound-guided core biopsy of one of the liver lesions recently and the final pathology was consistent with metastatic small cell carcinoma. He is here today for evaluation and discussion of his treatment options.  MEDICAL HISTORY: Past Medical History  Diagnosis Date  . Hypertension   . Heart attack 1991    Inferior- wall MI  treated with emergency PTCA with RCA by Dr. Allyson Barker, with a perfusion  time of 1 hour 55 minutes.  . Emphysema   . Hyperlipidemia   . Allergic rhinitis   . Heart failure   . Cholelithiasis   . Cardiac pacemaker     Dule-chamber permanent pacemaker - [employer] RF device model # [id number], serial # [id number].  Marland Kitchen COPD (chronic obstructive pulmonary disease)     Longstanding COPD. Uses oxygen - 4 L per minute.  . SOB (shortness of breath)     Transthoracic Echo 04/24/12 EF = 55-60%. Showed sinus rhythm with left atrial abnormality, left axis deviation, and almost an atypical pulmonary disease pattern.   . Paroxysmal a-fib     Permanent Transvenous pacemaker insertion by Dr. Lynnea Barker in 2012 and is followed by Dr. Royann Barker.  . Coronary artery disease     Coronary bypass graft in 2000 with a LIMA to his LAD, vein to obtuse marginal branch #1 and to the PDA. Myoview stress test 03/10/09 was nonischemic. Myocardial perfusion Imaging -04/24/12 - Low risk stress nuclear study. No evidence of ischemia, however the fixed perfusion defect is more prominent.   . Carotid artery disease     Had a left carotid endarterectomy with Dopplers performed in 03/2011 that showed a widely patent left endarterectomy site with no significant disease on the right.  . Oxygen dependent     Longstanding COPD - wear 4 L per minute  . Diabetes     non-insulin-requiring diabetes  . Cancer     Small cell lung cancer. Received chemotherapy and radiation therapy.   . Duodenitis 01/30/11    ALLERGIES:  has No Known Allergies.  MEDICATIONS:  Current  Outpatient Prescriptions  Medication Sig Dispense Refill  . acetaminophen (TYLENOL) 325 MG tablet Take 650 mg by mouth every 6 (six) hours as needed.        Marland Kitchen albuterol (PROVENTIL HFA;VENTOLIN HFA) 108 (90 BASE) MCG/ACT inhaler Inhale 2 puffs into the lungs every 6 (six) hours as needed for wheezing.      Marland Kitchen aspirin EC 325 MG tablet Take 325 mg by mouth daily.       . calcium acetate (PHOSLO) 667 MG capsule Take 667 mg by mouth.      .  Chlorphen-Pseudoephed-APAP (CORICIDIN D PO) Take 2 tablets by mouth daily as needed.       . clopidogrel (PLAVIX) 75 MG tablet Take 1 tablet by mouth daily.      . DiphenhydrAMINE HCl (BENADRYL ALLERGY PO) Take 1 tablet by mouth as needed.      . feeding supplement (GLUCERNA SHAKE) LIQD Take 237 mLs by mouth 2 (two) times daily before a meal.        . Fluticasone-Salmeterol (ADVAIR DISKUS) 100-50 MCG/DOSE AEPB Inhale 1 puff into the lungs 2 (two) times daily.  60 each  5  . isosorbide mononitrate (IMDUR) 30 MG 24 hr tablet Take 1 tablet by mouth every morning.       . loratadine (CLARITIN) 10 MG tablet Take 1 tablet (10 mg total) by mouth daily.  30 tablet  4  . losartan-hydrochlorothiazide (HYZAAR) 50-12.5 MG per tablet Take 1 tablet by mouth daily.  90 tablet  3  . metoprolol tartrate (LOPRESSOR) 25 MG tablet Take 12.5 mg by mouth 2 (two) times daily. 12.5mg  BID      . Multiple Vitamin (MULTIVITAMIN) tablet Take 1 tablet by mouth daily.        . nitroGLYCERIN (NITROSTAT) 0.4 MG SL tablet Place 0.4 mg under the tongue every 5 (five) minutes as needed.        . OXYGEN-HELIUM IN Inhale 6 L into the lungs continuous.      Marland Kitchen Phenyleph-CPM-DM-Aspirin (ALKA-SELTZER PLUS COLD & COUGH PO) Take 2 tablets by mouth daily.      . potassium chloride SA (K-DUR,KLOR-CON) 20 MEQ tablet Take 1 tablet (20 mEq total) by mouth daily.  10 tablet  0  . simvastatin (ZOCOR) 20 MG tablet Take 20 mg by mouth at bedtime.        Marland Kitchen tiotropium (SPIRIVA HANDIHALER) 18 MCG inhalation capsule Place 1 capsule (18 mcg total) into inhaler and inhale daily.  30 capsule  5  . UNABLE TO FIND Med Name: prostate supplement      . [DISCONTINUED] pantoprazole (PROTONIX) 40 MG tablet Take 40 mg by mouth daily.        No current facility-administered medications for this visit.    SURGICAL HISTORY:  Past Surgical History  Procedure Laterality Date  . Pacemaker insertion  2012    For prolonged sinus pause.  [name]  placement4/18/12 tip svc    . Carotid endarterectomy  03/2011    With Dopplers performed most recently in 03/2011 that showed a widely patent left endarterectomy site with no significant disease on the right.  . Coronary artery bypass graft  2000    CAD - CABG with a LIMA to his LAD, a vein to the obtuse marginal branch #1 and to the PDA  . Cardiac catheterization  06/2010    Unstable angina - Catheterized by Dr. Tresa Endo was noted to have essentially total occlusion of the native coronary arteries, a patent LIMA to  the LAD & revealing a 70% stenosis at the origin of his circumflex and obtuse marginal graft, an 80% PLA lesion beyond graft insertion. Stent in distal right coronary artery. Predilated with 2.5 x 20 TREK balloon. Stent-Vision 2.75 x 23 balloon.  . Coronary angioplasty Right 06/2010    Unstable angina - Predilated with a 2.5 x 20 TREK balloon. Distal right coronary artery via  the saphenous vein graft with placement of a bare metal stent (a Vision 2.75 x 23 mm by Dr. Clarene Duke)    REVIEW OF SYSTEMS:  Constitutional: positive for fatigue Eyes: negative Ears, nose, mouth, throat, and face: negative Respiratory: positive for dyspnea on exertion Cardiovascular: negative Gastrointestinal: negative Genitourinary:negative Integument/breast: negative Hematologic/lymphatic: negative Musculoskeletal:negative Neurological: negative Behavioral/Psych: negative Endocrine: negative Allergic/Immunologic: negative   PHYSICAL EXAMINATION: General appearance: alert, cooperative, fatigued and no distress Head: Normocephalic, without obvious abnormality, atraumatic Neck: no adenopathy, no JVD, supple, symmetrical, trachea midline and thyroid not enlarged, symmetric, no tenderness/mass/nodules Lymph nodes: Cervical, supraclavicular, and axillary nodes normal. Resp: wheezes bilaterally Back: symmetric, no curvature. ROM normal. No CVA tenderness. Cardio: regular rate and rhythm, S1, S2 normal, no  murmur, click, rub or gallop GI: soft, non-tender; bowel sounds normal; no masses,  no organomegaly Extremities: extremities normal, atraumatic, no cyanosis or edema Neurologic: Alert and oriented X 3, normal strength and tone. Normal symmetric reflexes. Normal coordination and gait  ECOG PERFORMANCE STATUS: 2 - Symptomatic, <50% confined to bed  Blood pressure 105/47, pulse 63, temperature 97.8 F (36.6 C), temperature source Oral, resp. rate 20, height 5\' 8"  (1.727 m), weight 156 lb 6 oz (70.931 kg), SpO2 100.00%.  LABORATORY DATA: Lab Results  Component Value Date   WBC 5.6 01/07/2013   HGB 7.7* 01/07/2013   HCT 24.4* 01/07/2013   MCV 85.7 01/07/2013   PLT 156 01/07/2013      Chemistry      Component Value Date/Time   NA 144 12/24/2012 0841   NA 138 11/28/2012 1152   NA 137 11/21/2011 0945   K 4.3 12/24/2012 0841   K 4.4 11/28/2012 1152   K 4.5 11/21/2011 0945   CL 99 11/28/2012 1152   CL 100 06/21/2012 0912   CL 96* 11/21/2011 0945   CO2 34* 12/24/2012 0841   CO2 32 11/28/2012 1152   CO2 31 11/21/2011 0945   BUN 21.3 12/24/2012 0841   BUN 20 11/28/2012 1152   BUN 18 11/21/2011 0945   CREATININE 1.1 12/24/2012 0841   CREATININE 1.08 11/28/2012 1152   CREATININE 0.89 02/22/2011 1411      Component Value Date/Time   CALCIUM 9.5 12/24/2012 0841   CALCIUM 9.3 11/28/2012 1152   CALCIUM 9.4 11/21/2011 0945   CALCIUM 7.6* 09/18/2010 1136   ALKPHOS 80 12/24/2012 0841   ALKPHOS 50 11/21/2011 0945   ALKPHOS 70 02/22/2011 1411   AST 22 12/24/2012 0841   AST 24 11/21/2011 0945   AST 13 02/22/2011 1411   ALT 18 12/24/2012 0841   ALT 26 11/21/2011 0945   ALT 9 02/22/2011 1411   BILITOT 0.55 12/24/2012 0841   BILITOT 1.10 11/21/2011 0945   BILITOT 0.5 02/22/2011 1411       RADIOGRAPHIC STUDIES: Ct Chest W Contrast  12/24/2012   CLINICAL DATA:  Lung cancer diagnosed in October 2012. Chemotherapy and radiation therapy completed.  EXAM: CT CHEST WITH CONTRAST  TECHNIQUE: Multidetector CT imaging of  the chest was performed during intravenous contrast administration.  CONTRAST:  80mL OMNIPAQUE IOHEXOL 300 MG/ML  SOLN  COMPARISON:  Chest CTs dated 06/21/2012 and 02/22/2012.  FINDINGS: There are no enlarged mediastinal or hilar lymph nodes. Diffuse atherosclerosis of the aorta, great vessels and coronary arteries is again noted status post CABG. Right subclavian pacemaker leads appear unchanged.  There is no pleural or pericardial effusion. Left upper lobe scarring and bronchiectasis are stable, consistent with sequela of prior radiation therapy. There has been interval enlargement of the spiculated nodule involving the superior segment of the left lower lobe, now measuring 11 x 15 mm on image 12. No other enlarging pulmonary nodules are identified. Extensive bullous emphysematous changes are again noted at both lung bases.  There has been interval enlargement of the previously described lesion involving the dome of the right hepatic lobe. This now measures 4.2 x 3.5 cm and demonstrate central low density and contiguous peripheral enhancement consistent with a metastasis. In addition, there are multiple additional hepatic metastases, including a 3.2 cm lesion in the left lobe on image 60 to and a 2.9 cm lesion in the right lobe on image 66. A right renal cyst and bilateral adrenal nodularity are stable. There are no worrisome osseous findings.  IMPRESSION: 1. Interval development of widespread hepatic metastatic disease.  2. No other evidence of metastatic disease identified.  3. Enlarging spiculated lesion involving the superior segment of the left lower lobe. Given the proximity to the presumed radiation changes in left upper lobe, this could reflect postradiation scarring or mucoid impaction. However, the appearance is concerning for a metachronous primary lung cancer.   Electronically Signed   By: Roxy Horseman   On: 12/24/2012 10:20   US Biopsy  01/06/2013   *RADIOLOGY REPORT*  Indication: History of small  cell lung cancer, now with multiple new liver lesions worrisome for metastatic disease.  ULTRASOUND GUIDED LIVER BIOPSY  Comparison: Chest CT - 12/24/2012; 06/21/2012  Medications: Fentanyl 100 mcg IV; Versed 2 mg IV  Total Moderate Sedation time: 12 minutes  Complications: None immediate  Procedure:  Informed written consent was obtained from the patient after a discussion of the risks, benefits and alternatives to treatment. The patient understands and consents to the procedure.  A timeout was performed prior to the initiation of the procedure.  Ultrasound scanning was performed of the right upper abdominal quadrant demonstrate multiple slightly hypoechoic solid lesions within the liver compatible with findings on recent chest CT.  Dominant slightly infiltrative appearing hypoechoic lesion with the caudal aspect the right lobe of the liver was targeted for biopsy. The procedure was planned.  The right upper abdomen was prepped and draped in the usual sterile fashion.  The overlying soft tissues were anesthetized with 1% lidocaine with epinephrine.  A 17 gauge, 6.8 cm co-axial needle was advanced into a peripheral aspect of the mass and 4 core biopsies were obtained with an 18 gauge core device under direct ultrasound guidance.  The co-axial needle was removed and hemostasis was obtained with manual compression.  Post procedural scanning was negative for definitive area of hemorrhage.  A dressing was placed.  The patient tolerated the procedure well without immediate post procedural complication.  Impression:  Technically successful ultrasound guided biopsy of dominant slightly infiltrative appearing solid mass within the caudal aspect the right lobe of the liver.   Original Report Authenticated By: Tacey Ruiz, MD    ASSESSMENT AND PLAN: This is a very pleasant 79 years old white male with recurrent small cell lung cancer with multiple liver metastasis.  I have a lengthy discussion with  the patient and his  family today about his current disease stage, prognosis and treatment options. I gave the patient the option of palliative care versus proceeding with palliative chemotherapy with carboplatin and etoposide. I discussed with the patient adverse effect of the chemotherapy including but not limited to alopecia, myelosuppression, nausea and vomiting, peripheral neuropathy, liver or renal dysfunction. The patient would like to consider the systemic chemotherapy and I will arrange for him to receive the first cycle on 01/14/2013. He would come back for followup visit in 2 weeks for reevaluation and management any adverse effect of his treatment. He was advised to call immediately if he has any concerning symptoms in the interval.  The patient voices understanding of current disease status and treatment options and is in agreement with the current care plan.  All questions were answered. The patient knows to call the clinic with any problems, questions or concerns. We can certainly see the patient much sooner if necessary.  I spent 15 minutes counseling the patient face to face. The total time spent in the appointment was 25 minutes.

## 2013-01-08 ENCOUNTER — Ambulatory Visit (HOSPITAL_BASED_OUTPATIENT_CLINIC_OR_DEPARTMENT_OTHER): Payer: Medicare Other

## 2013-01-08 ENCOUNTER — Ambulatory Visit (HOSPITAL_COMMUNITY)
Admission: RE | Admit: 2013-01-08 | Discharge: 2013-01-08 | Disposition: A | Discharge status: Home / Self Care | Payer: Medicare Other | Source: Ambulatory Visit | Attending: Internal Medicine | Admitting: Internal Medicine

## 2013-01-08 VITALS — BP 111/57 | HR 65 | Temp 96.9°F | Resp 18

## 2013-01-08 DIAGNOSIS — C3490 Malignant neoplasm of unspecified part of unspecified bronchus or lung: Secondary | ICD-10-CM

## 2013-01-08 DIAGNOSIS — C787 Secondary malignant neoplasm of liver and intrahepatic bile duct: Secondary | ICD-10-CM | POA: Insufficient documentation

## 2013-01-08 DIAGNOSIS — T451X5A Adverse effect of antineoplastic and immunosuppressive drugs, initial encounter: Secondary | ICD-10-CM | POA: Insufficient documentation

## 2013-01-08 DIAGNOSIS — D6481 Anemia due to antineoplastic chemotherapy: Secondary | ICD-10-CM | POA: Insufficient documentation

## 2013-01-08 DIAGNOSIS — D649 Anemia, unspecified: Secondary | ICD-10-CM

## 2013-01-08 MED ORDER — HEPARIN SOD (PORK) LOCK FLUSH 100 UNIT/ML IV SOLN
500.0000 [IU] | Freq: Every day | INTRAVENOUS | Status: AC | PRN
  Administered 2013-01-08: 500 [IU]
  Filled 2013-01-08: qty 5

## 2013-01-08 MED ORDER — ACETAMINOPHEN 325 MG PO TABS
650.0000 mg | ORAL_TABLET | Freq: Once | ORAL | Status: AC
  Administered 2013-01-08: 650 mg via ORAL

## 2013-01-08 MED ORDER — DIPHENHYDRAMINE HCL 25 MG PO CAPS
25.0000 mg | ORAL_CAPSULE | Freq: Once | ORAL | Status: AC
  Administered 2013-01-08: 25 mg via ORAL

## 2013-01-08 MED ORDER — LIDOCAINE-PRILOCAINE 2.5-2.5 % EX CREA: TOPICAL_CREAM | CUTANEOUS | Status: AC | PRN

## 2013-01-08 MED ORDER — DIPHENHYDRAMINE HCL 25 MG PO CAPS
ORAL_CAPSULE | ORAL | Status: AC
  Filled 2013-01-08: qty 1

## 2013-01-08 MED ORDER — PROCHLORPERAZINE MALEATE 10 MG PO TABS: 10.0000 mg | ORAL_TABLET | Freq: Four times a day (QID) | ORAL | Status: AC | PRN

## 2013-01-08 MED ORDER — SODIUM CHLORIDE 0.9 % IV SOLN
250.0000 mL | Freq: Once | INTRAVENOUS | Status: AC
  Administered 2013-01-08: 250 mL via INTRAVENOUS

## 2013-01-08 MED ORDER — ACETAMINOPHEN 325 MG PO TABS
ORAL_TABLET | ORAL | Status: AC
  Filled 2013-01-08: qty 2

## 2013-01-08 MED ORDER — SODIUM CHLORIDE 0.9 % IV SOLN: 250.0000 mL | Freq: Once | INTRAVENOUS | Status: DC

## 2013-01-08 MED ORDER — SODIUM CHLORIDE 0.9 % IJ SOLN
10.0000 mL | INTRAMUSCULAR | Status: AC | PRN
  Administered 2013-01-08: 10 mL
  Filled 2013-01-08: qty 10

## 2013-01-08 NOTE — Progress Notes (Signed)
Dr Donnald Garre notified of low BP, pt asymptomatic.  Okay to give benadryl.  SLJ

## 2013-01-08 NOTE — Patient Instructions (Addendum)
Blood Transfusion  A blood transfusion replaces your blood or some of its parts. Blood is replaced when you have lost blood because of surgery, an accident, or for severe blood conditions like anemia. You can donate blood to be used on yourself if you have a planned surgery. If you lose blood during that surgery, your own blood can be given back to you. Any blood given to you is checked to make sure it matches your blood type. Your temperature, blood pressure, and heart rate (vital signs) will be checked often.  GET HELP RIGHT AWAY IF:   You feel sick to your stomach (nauseous) or throw up (vomit).  You have watery poop (diarrhea).  You have shortness of breath or trouble breathing.  You have blood in your pee (urine) or have dark colored pee.  You have chest pain or tightness.  Your eyes or skin turn yellow (jaundice).  You have a temperature by mouth above 102 F (38.9 C), not controlled by medicine.  You start to shake and have chills.  You develop a a red rash (hives) or feel itchy.  You develop lightheadedness or feel confused.  You develop back, joint, or muscle pain.  You do not feel hungry (lost appetite).  You feel tired, restless, or nervous.  You develop belly (abdominal) cramps. Document Released: 06/23/2008 Document Revised: 06/19/2011 Document Reviewed: 06/23/2008 [employer] Patient Information 2014 [employer].  

## 2013-01-09 ENCOUNTER — Encounter: Payer: Self-pay | Admitting: Cardiovascular Disease

## 2013-01-09 ENCOUNTER — Ambulatory Visit: Payer: Medicare Other | Admitting: Internal Medicine

## 2013-01-09 LAB — TYPE AND SCREEN
ABO/RH(D): O NEG
Antibody Screen: NEGATIVE
Unit division: 0
Unit division: 0
Unit division: 0

## 2013-01-13 ENCOUNTER — Telehealth: Payer: Self-pay | Admitting: Emergency Medicine

## 2013-01-13 NOTE — Telephone Encounter (Signed)
I spoke with spouse. She stated she was told by Dr. Shirline Frees pt cancer is small cell and in stage 4. Pt has already been giving 2 units of blood and chemo starts tomorrow. She reports if he didn't have tx then pt would have less than 2 months to live. If he had treatment then he would 50-50% chance to have another 9 months to live. This is FYI for RB.

## 2013-01-13 NOTE — Telephone Encounter (Signed)
Thank you :)

## 2013-01-14 ENCOUNTER — Telehealth: Payer: Self-pay | Admitting: *Deleted

## 2013-01-14 ENCOUNTER — Ambulatory Visit (HOSPITAL_BASED_OUTPATIENT_CLINIC_OR_DEPARTMENT_OTHER): Payer: Medicare Other

## 2013-01-14 ENCOUNTER — Other Ambulatory Visit (HOSPITAL_BASED_OUTPATIENT_CLINIC_OR_DEPARTMENT_OTHER): Payer: Medicare Other | Admitting: Lab

## 2013-01-14 VITALS — BP 93/51 | HR 76 | Temp 97.0°F

## 2013-01-14 DIAGNOSIS — C3490 Malignant neoplasm of unspecified part of unspecified bronchus or lung: Secondary | ICD-10-CM

## 2013-01-14 DIAGNOSIS — C3410 Malignant neoplasm of upper lobe, unspecified bronchus or lung: Secondary | ICD-10-CM

## 2013-01-14 DIAGNOSIS — C3491 Malignant neoplasm of unspecified part of right bronchus or lung: Secondary | ICD-10-CM

## 2013-01-14 DIAGNOSIS — Z5111 Encounter for antineoplastic chemotherapy: Secondary | ICD-10-CM

## 2013-01-14 LAB — CBC WITH DIFFERENTIAL/PLATELET
BASO%: 0.3 % (ref 0.0–2.0)
EOS%: 2.9 % (ref 0.0–7.0)
MCH: 26.8 pg — ABNORMAL LOW (ref 27.2–33.4)
MCHC: 30 g/dL — ABNORMAL LOW (ref 32.0–36.0)
MONO#: 0.5 10*3/uL (ref 0.1–0.9)
NEUT#: 5.5 10*3/uL (ref 1.5–6.5)
NEUT%: 79.2 % — ABNORMAL HIGH (ref 39.0–75.0)
Platelets: 147 10*3/uL (ref 140–400)
RBC: 3.28 10*6/uL — ABNORMAL LOW (ref 4.20–5.82)
RDW: 14.7 % — ABNORMAL HIGH (ref 11.0–14.6)
WBC: 6.9 10*3/uL (ref 4.0–10.3)
lymph#: 0.7 10*3/uL — ABNORMAL LOW (ref 0.9–3.3)
nRBC: 0 % (ref 0–0)

## 2013-01-14 LAB — COMPREHENSIVE METABOLIC PANEL (CC13)
ALT: 14 U/L (ref 0–55)
AST: 18 U/L (ref 5–34)
Albumin: 3.2 g/dL — ABNORMAL LOW (ref 3.5–5.0)
Alkaline Phosphatase: 70 U/L (ref 40–150)
BUN: 25.6 mg/dL (ref 7.0–26.0)
Potassium: 3.8 mEq/L (ref 3.5–5.1)
Sodium: 141 mEq/L (ref 136–145)
Total Protein: 6.2 g/dL — ABNORMAL LOW (ref 6.4–8.3)

## 2013-01-14 MED ORDER — SODIUM CHLORIDE 0.9 % IJ SOLN
10.0000 mL | INTRAMUSCULAR | Status: DC | PRN
Start: 2013-01-14 — End: 2013-01-14
  Administered 2013-01-14: 10 mL
  Filled 2013-01-14: qty 10

## 2013-01-14 MED ORDER — SODIUM CHLORIDE 0.9 % IV SOLN
100.0000 mg/m2 | Freq: Once | INTRAVENOUS | Status: AC
  Administered 2013-01-14: 180 mg via INTRAVENOUS
  Filled 2013-01-14: qty 9

## 2013-01-14 MED ORDER — SODIUM CHLORIDE 0.9 % IV SOLN
Freq: Once | INTRAVENOUS | Status: AC
  Administered 2013-01-14: 11:00:00 via INTRAVENOUS

## 2013-01-14 MED ORDER — ONDANSETRON 16 MG/50ML IVPB (CHCC)
INTRAVENOUS | Status: AC
  Filled 2013-01-14: qty 16

## 2013-01-14 MED ORDER — DEXAMETHASONE SODIUM PHOSPHATE 20 MG/5ML IJ SOLN
INTRAMUSCULAR | Status: AC
  Filled 2013-01-14: qty 5

## 2013-01-14 MED ORDER — DEXAMETHASONE SODIUM PHOSPHATE 20 MG/5ML IJ SOLN
20.0000 mg | Freq: Once | INTRAMUSCULAR | Status: AC
  Administered 2013-01-14: 20 mg via INTRAVENOUS

## 2013-01-14 MED ORDER — SODIUM CHLORIDE 0.9 % IV SOLN
398.0000 mg | Freq: Once | INTRAVENOUS | Status: AC
  Administered 2013-01-14: 400 mg via INTRAVENOUS
  Filled 2013-01-14: qty 40

## 2013-01-14 MED ORDER — HEPARIN SOD (PORK) LOCK FLUSH 100 UNIT/ML IV SOLN
500.0000 [IU] | Freq: Once | INTRAVENOUS | Status: AC | PRN
  Administered 2013-01-14: 500 [IU]
  Filled 2013-01-14: qty 5

## 2013-01-14 MED ORDER — ONDANSETRON 16 MG/50ML IVPB (CHCC)
16.0000 mg | Freq: Once | INTRAVENOUS | Status: AC
  Administered 2013-01-14: 16 mg via INTRAVENOUS

## 2013-01-14 NOTE — Patient Instructions (Addendum)
[facility] Discharge Instructions for Patients Receiving Chemotherapy  Today you received the following chemotherapy agents carboplatin and VP-16  To help prevent nausea and vomiting after your treatment, we encourage you to take your nausea medication compazine.   If you develop nausea and vomiting that is not controlled by your nausea medication, call the clinic.   BELOW ARE SYMPTOMS THAT SHOULD BE REPORTED IMMEDIATELY:  *FEVER GREATER THAN 100.5 F  *CHILLS WITH OR WITHOUT FEVER  NAUSEA AND VOMITING THAT IS NOT CONTROLLED WITH YOUR NAUSEA MEDICATION  *UNUSUAL SHORTNESS OF BREATH  *UNUSUAL BRUISING OR BLEEDING  TENDERNESS IN MOUTH AND THROAT WITH OR WITHOUT PRESENCE OF ULCERS  *URINARY PROBLEMS  *BOWEL PROBLEMS  UNUSUAL RASH Items with * indicate a potential emergency and should be followed up as soon as possible.  Feel free to call the clinic you have any questions or concerns. The clinic phone number is [phone].

## 2013-01-14 NOTE — Telephone Encounter (Signed)
Pt's wife in infusion room stating that pt had a black stool on Saturday 01/11/13.  Per Dr Donnald Garre, okay to give pt stool cards.  SLJ

## 2013-01-14 NOTE — Progress Notes (Signed)
Wife reports [name] had a "formed black BM on Saturday".Denies abdominal pain /nausea/vomiting. I told wife to let us know if it happens again .

## 2013-01-15 ENCOUNTER — Ambulatory Visit (HOSPITAL_BASED_OUTPATIENT_CLINIC_OR_DEPARTMENT_OTHER): Payer: Medicare Other

## 2013-01-15 VITALS — BP 101/65 | HR 96 | Temp 97.6°F | Resp 18

## 2013-01-15 DIAGNOSIS — C3410 Malignant neoplasm of upper lobe, unspecified bronchus or lung: Secondary | ICD-10-CM

## 2013-01-15 DIAGNOSIS — Z5111 Encounter for antineoplastic chemotherapy: Secondary | ICD-10-CM

## 2013-01-15 DIAGNOSIS — C3491 Malignant neoplasm of unspecified part of right bronchus or lung: Secondary | ICD-10-CM

## 2013-01-15 MED ORDER — HEPARIN SOD (PORK) LOCK FLUSH 100 UNIT/ML IV SOLN
500.0000 [IU] | Freq: Once | INTRAVENOUS | Status: AC | PRN
  Administered 2013-01-15: 500 [IU]
  Filled 2013-01-15: qty 5

## 2013-01-15 MED ORDER — PROCHLORPERAZINE MALEATE 10 MG PO TABS
10.0000 mg | ORAL_TABLET | Freq: Once | ORAL | Status: AC
  Administered 2013-01-15: 10 mg via ORAL

## 2013-01-15 MED ORDER — PROCHLORPERAZINE MALEATE 10 MG PO TABS
ORAL_TABLET | ORAL | Status: AC
  Filled 2013-01-15: qty 1

## 2013-01-15 MED ORDER — SODIUM CHLORIDE 0.9 % IV SOLN
Freq: Once | INTRAVENOUS | Status: AC
  Administered 2013-01-15: 13:00:00 via INTRAVENOUS

## 2013-01-15 MED ORDER — SODIUM CHLORIDE 0.9 % IJ SOLN
10.0000 mL | INTRAMUSCULAR | Status: DC | PRN
  Administered 2013-01-15: 10 mL
  Filled 2013-01-15: qty 10

## 2013-01-15 MED ORDER — SODIUM CHLORIDE 0.9 % IV SOLN
100.0000 mg/m2 | Freq: Once | INTRAVENOUS | Status: AC
  Administered 2013-01-15: 180 mg via INTRAVENOUS
  Filled 2013-01-15: qty 9

## 2013-01-16 ENCOUNTER — Ambulatory Visit (HOSPITAL_BASED_OUTPATIENT_CLINIC_OR_DEPARTMENT_OTHER): Payer: Medicare Other

## 2013-01-16 VITALS — BP 110/66 | HR 80 | Temp 97.8°F | Resp 18

## 2013-01-16 DIAGNOSIS — Z5111 Encounter for antineoplastic chemotherapy: Secondary | ICD-10-CM

## 2013-01-16 DIAGNOSIS — C3491 Malignant neoplasm of unspecified part of right bronchus or lung: Secondary | ICD-10-CM

## 2013-01-16 DIAGNOSIS — Z23 Encounter for immunization: Secondary | ICD-10-CM

## 2013-01-16 DIAGNOSIS — C3410 Malignant neoplasm of upper lobe, unspecified bronchus or lung: Secondary | ICD-10-CM

## 2013-01-16 MED ORDER — SODIUM CHLORIDE 0.9 % IV SOLN: Freq: Once | INTRAVENOUS | Status: DC

## 2013-01-16 MED ORDER — INFLUENZA VAC SPLIT QUAD 0.5 ML IM SUSP
0.5000 mL | Freq: Once | INTRAMUSCULAR | Status: AC
  Administered 2013-01-16: 0.5 mL via INTRAMUSCULAR
  Filled 2013-01-16: qty 0.5

## 2013-01-16 MED ORDER — SODIUM CHLORIDE 0.9 % IV SOLN
100.0000 mg/m2 | Freq: Once | INTRAVENOUS | Status: AC
  Administered 2013-01-16: 180 mg via INTRAVENOUS
  Filled 2013-01-16: qty 9

## 2013-01-16 MED ORDER — HEPARIN SOD (PORK) LOCK FLUSH 100 UNIT/ML IV SOLN
500.0000 [IU] | Freq: Once | INTRAVENOUS | Status: AC | PRN
  Administered 2013-01-16: 500 [IU]
  Filled 2013-01-16: qty 5

## 2013-01-16 MED ORDER — PROCHLORPERAZINE MALEATE 10 MG PO TABS
10.0000 mg | ORAL_TABLET | Freq: Once | ORAL | Status: AC
  Administered 2013-01-16: 10 mg via ORAL

## 2013-01-16 MED ORDER — PROCHLORPERAZINE MALEATE 10 MG PO TABS
ORAL_TABLET | ORAL | Status: AC
  Filled 2013-01-16: qty 1

## 2013-01-16 MED ORDER — SODIUM CHLORIDE 0.9 % IJ SOLN
10.0000 mL | INTRAMUSCULAR | Status: DC | PRN
  Administered 2013-01-16: 10 mL
  Filled 2013-01-16: qty 10

## 2013-01-16 NOTE — Patient Instructions (Addendum)
[facility] Discharge Instructions for Patients Receiving Chemotherapy  Today you received the following chemotherapy agents: etoposide  To help prevent nausea and vomiting after your treatment, we encourage you to take your nausea medication.  Take it as often as prescribed.     If you develop nausea and vomiting that is not controlled by your nausea medication, call the clinic. If it is after clinic hours your family physician or the after hours number for the clinic or go to the Emergency Department.   BELOW ARE SYMPTOMS THAT SHOULD BE REPORTED IMMEDIATELY:  *FEVER GREATER THAN 100.5 F  *CHILLS WITH OR WITHOUT FEVER  NAUSEA AND VOMITING THAT IS NOT CONTROLLED WITH YOUR NAUSEA MEDICATION  *UNUSUAL SHORTNESS OF BREATH  *UNUSUAL BRUISING OR BLEEDING  TENDERNESS IN MOUTH AND THROAT WITH OR WITHOUT PRESENCE OF ULCERS  *URINARY PROBLEMS  *BOWEL PROBLEMS  UNUSUAL RASH Items with * indicate a potential emergency and should be followed up as soon as possible.  Feel free to call the clinic you have any questions or concerns. The clinic phone number is [phone].   I have been informed and understand all the instructions given to me. I know to contact the clinic, my physician, or go to the Emergency Department if any problems should occur. I do not have any questions at this time, but understand that I may call the clinic during office hours   should I have any questions or need assistance in obtaining follow up care.    __________________________________________  _____________  __________ Signature of Patient or Authorized Representative            Date                   Time    __________________________________________ Nurse's Signature   Bendamustine Injection What is this medicine? BENDAMUSTINE (BEN da MUS teen) is an anti-cancer drug used to treat a certain type of leukemia. This medicine may be used for other purposes; ask your health care provider  or pharmacist if you have questions. What should I tell my health care provider before I take this medicine? They need to know if you have any of these conditions: -kidney disease -liver disease -an unusual or allergic reaction to bendamustine, mannitol, other medicines, foods, dyes, or preservatives -pregnant or trying to get pregnant -breast-feeding How should I use this medicine? This medicine is for infusion into a vein. It is given by a health care professional in a hospital or clinic setting. Talk to your pediatrician regarding the use of this medicine in children. Special care may be needed. Overdosage: If you think you have taken too much of this medicine contact a poison control center or emergency room at once. NOTE: This medicine is only for you. Do not share this medicine with others. What if I miss a dose? It is important not to miss your dose. Call your doctor or health care professional if you are unable to keep an appointment. What may interact with this medicine? Do not take this medicine with any of the following medications: -clozapine This medicine may also interact with the following medications: -atazanavir -cimetidine -ciprofloxacin -enoxacin -fluvoxamine -medicines for seizures like carbamazepine and phenobarbital -mexiletine -rifampin -tacrine -thiabendazole -zileuton This list may not describe all possible interactions. Give your health care provider a list of all the medicines, herbs, non-prescription drugs, or dietary supplements you use. Also tell them if you smoke, drink alcohol, or use illegal drugs. Some items may interact with  your medicine. What should I watch for while using this medicine? Your condition will be monitored carefully while you are receiving this medicine. This drug may make you feel generally unwell. This is not uncommon, as chemotherapy can affect healthy cells as well as cancer cells. Report any side effects. Continue your course of  treatment even though you feel ill unless your doctor tells you to stop. Call your doctor or health care professional for advice if you get a fever, chills or sore throat, or other symptoms of a cold or flu. Do not treat yourself. This drug decreases your body's ability to fight infections. Try to avoid being around people who are sick. This medicine may increase your risk to bruise or bleed. Call your doctor or health care professional if you notice any unusual bleeding. Be careful brushing and flossing your teeth or using a toothpick because you may get an infection or bleed more easily. If you have any dental work done, tell your dentist you are receiving this medicine. Avoid taking products that contain aspirin, acetaminophen, ibuprofen, naproxen, or ketoprofen unless instructed by your doctor. These medicines may hide a fever. Do not become pregnant while taking this medicine. Women should inform their doctor if they wish to become pregnant or think they might be pregnant. There is a potential for serious side effects to an unborn child. Men should inform their doctors if they wish to father a child. This medicine may lower sperm counts. Talk to your health care professional or pharmacist for more information. Do not breast-feed an infant while taking this medicine. What side effects may I notice from receiving this medicine? Side effects that you should report to your doctor or health care professional as soon as possible: -allergic reactions like skin rash, itching or hives, swelling of the face, lips, or tongue -low blood counts - this medicine may decrease the number of white blood cells, red blood cells and platelets. You may be at increased risk for infections and bleeding. -signs of infection - fever or chills, cough, sore throat, pain or difficulty passing urine -signs of decreased platelets or bleeding - bruising, pinpoint red spots on the skin, black, tarry stools, blood in the  urine -signs of decreased red blood cells - unusually weak or tired, fainting spells, lightheadedness -trouble passing urine or change in the amount of urine Side effects that usually do not require medical attention (report to your doctor or health care professional if they continue or are bothersome): -diarrhea This list may not describe all possible side effects. Call your doctor for medical advice about side effects. You may report side effects to FDA at 1-800-FDA-1088. Where should I keep my medicine? This drug is given in a hospital or clinic and will not be stored at home. NOTE: This sheet is a summary. It may not cover all possible information. If you have questions about this medicine, talk to your doctor, pharmacist, or health care provider.  2012, [employer]. (07/01/2007 3:58:27 PM)

## 2013-01-17 ENCOUNTER — Ambulatory Visit (HOSPITAL_BASED_OUTPATIENT_CLINIC_OR_DEPARTMENT_OTHER): Payer: Medicare Other

## 2013-01-17 ENCOUNTER — Telehealth: Payer: Self-pay

## 2013-01-17 VITALS — BP 95/49 | HR 76 | Temp 98.0°F

## 2013-01-17 DIAGNOSIS — C3491 Malignant neoplasm of unspecified part of right bronchus or lung: Secondary | ICD-10-CM

## 2013-01-17 DIAGNOSIS — C3410 Malignant neoplasm of upper lobe, unspecified bronchus or lung: Secondary | ICD-10-CM

## 2013-01-17 DIAGNOSIS — Z5189 Encounter for other specified aftercare: Secondary | ICD-10-CM

## 2013-01-17 MED ORDER — PEGFILGRASTIM INJECTION 6 MG/0.6ML
6.0000 mg | Freq: Once | SUBCUTANEOUS | Status: AC
  Administered 2013-01-17: 6 mg via SUBCUTANEOUS
  Filled 2013-01-17: qty 0.6

## 2013-01-17 NOTE — Telephone Encounter (Signed)
Pt. doing fine .  No problems with nausea, vomiting, or diarrhea.  He is currently using claritin.  Told his wife that this can help with aches from neulasta.  If he gets nauseated he needs to use the compasine as diredted.  Call [phone] if the compazine is not effective in controlling  Nausea.  Wife verbalized understanding.

## 2013-01-17 NOTE — Telephone Encounter (Signed)
Message copied by Lorine Bears on Fri Jan 17, 2013 12:46 PM ------      Message from: Orbie Hurst      Created: Tue Jan 14, 2013 10:47 AM       [facility] Dr. Kerry Fort  [phone] ------

## 2013-01-17 NOTE — Patient Instructions (Signed)

## 2013-01-21 ENCOUNTER — Telehealth: Payer: Self-pay | Admitting: Internal Medicine

## 2013-01-21 ENCOUNTER — Other Ambulatory Visit (HOSPITAL_BASED_OUTPATIENT_CLINIC_OR_DEPARTMENT_OTHER): Payer: Medicare Other | Admitting: Lab

## 2013-01-21 ENCOUNTER — Other Ambulatory Visit (HOSPITAL_BASED_OUTPATIENT_CLINIC_OR_DEPARTMENT_OTHER): Payer: Medicare Other | Admitting: Internal Medicine

## 2013-01-21 ENCOUNTER — Other Ambulatory Visit: Payer: Self-pay | Admitting: *Deleted

## 2013-01-21 ENCOUNTER — Ambulatory Visit: Payer: Medicare Other | Admitting: Lab

## 2013-01-21 ENCOUNTER — Telehealth: Payer: Self-pay | Admitting: *Deleted

## 2013-01-21 ENCOUNTER — Ambulatory Visit (HOSPITAL_BASED_OUTPATIENT_CLINIC_OR_DEPARTMENT_OTHER): Payer: Medicare Other

## 2013-01-21 ENCOUNTER — Ambulatory Visit (HOSPITAL_BASED_OUTPATIENT_CLINIC_OR_DEPARTMENT_OTHER): Payer: Medicare Other | Admitting: Physician Assistant

## 2013-01-21 VITALS — BP 121/62 | HR 84 | Temp 97.6°F | Resp 20

## 2013-01-21 VITALS — BP 85/50 | HR 79 | Temp 96.7°F | Resp 18 | Ht 68.0 in | Wt 155.8 lb

## 2013-01-21 DIAGNOSIS — C787 Secondary malignant neoplasm of liver and intrahepatic bile duct: Secondary | ICD-10-CM

## 2013-01-21 DIAGNOSIS — D6481 Anemia due to antineoplastic chemotherapy: Secondary | ICD-10-CM

## 2013-01-21 DIAGNOSIS — C3490 Malignant neoplasm of unspecified part of unspecified bronchus or lung: Secondary | ICD-10-CM

## 2013-01-21 DIAGNOSIS — C3410 Malignant neoplasm of upper lobe, unspecified bronchus or lung: Secondary | ICD-10-CM

## 2013-01-21 DIAGNOSIS — D649 Anemia, unspecified: Secondary | ICD-10-CM

## 2013-01-21 DIAGNOSIS — R195 Other fecal abnormalities: Secondary | ICD-10-CM

## 2013-01-21 DIAGNOSIS — C3491 Malignant neoplasm of unspecified part of right bronchus or lung: Secondary | ICD-10-CM

## 2013-01-21 LAB — CBC WITH DIFFERENTIAL/PLATELET
Basophils Absolute: 0.1 10*3/uL (ref 0.0–0.1)
EOS%: 5.9 % (ref 0.0–7.0)
Eosinophils Absolute: 0.2 10*3/uL (ref 0.0–0.5)
HCT: 22.7 % — ABNORMAL LOW (ref 38.4–49.9)
HGB: 7.2 g/dL — ABNORMAL LOW (ref 13.0–17.1)
LYMPH%: 12.1 % — ABNORMAL LOW (ref 14.0–49.0)
MCH: 27.1 pg — ABNORMAL LOW (ref 27.2–33.4)
MCV: 84.9 fL (ref 79.3–98.0)
MONO#: 0 10*3/uL — ABNORMAL LOW (ref 0.1–0.9)
MONO%: 1.4 % (ref 0.0–14.0)
NEUT#: 2 10*3/uL (ref 1.5–6.5)
NEUT%: 77.4 % — ABNORMAL HIGH (ref 39.0–75.0)
Platelets: 82 10*3/uL — ABNORMAL LOW (ref 140–400)
RDW: 15.8 % — ABNORMAL HIGH (ref 11.0–14.6)

## 2013-01-21 LAB — COMPREHENSIVE METABOLIC PANEL (CC13)
AST: 14 U/L (ref 5–34)
Alkaline Phosphatase: 67 U/L (ref 40–150)
BUN: 24.7 mg/dL (ref 7.0–26.0)
CO2: 31 mEq/L — ABNORMAL HIGH (ref 22–29)
Chloride: 100 mEq/L (ref 98–109)
Creatinine: 0.9 mg/dL (ref 0.7–1.3)
Total Bilirubin: 0.77 mg/dL (ref 0.20–1.20)

## 2013-01-21 LAB — FECAL OCCULT BLOOD, GUAIAC: Occult Blood: POSITIVE

## 2013-01-21 MED ORDER — ACETAMINOPHEN 325 MG PO TABS
650.0000 mg | ORAL_TABLET | Freq: Once | ORAL | Status: AC
  Administered 2013-01-21: 650 mg via ORAL

## 2013-01-21 MED ORDER — SODIUM CHLORIDE 0.9 % IV SOLN
250.0000 mL | Freq: Once | INTRAVENOUS | Status: AC
  Administered 2013-01-21: 250 mL via INTRAVENOUS

## 2013-01-21 MED ORDER — HEPARIN SOD (PORK) LOCK FLUSH 100 UNIT/ML IV SOLN
500.0000 [IU] | Freq: Every day | INTRAVENOUS | Status: AC | PRN
  Administered 2013-01-21: 500 [IU]
  Filled 2013-01-21: qty 5

## 2013-01-21 MED ORDER — DIPHENHYDRAMINE HCL 25 MG PO CAPS
25.0000 mg | ORAL_CAPSULE | Freq: Once | ORAL | Status: AC
  Administered 2013-01-21: 25 mg via ORAL

## 2013-01-21 MED ORDER — SODIUM CHLORIDE 0.9 % IJ SOLN
10.0000 mL | INTRAMUSCULAR | Status: AC | PRN
  Administered 2013-01-21: 10 mL
  Filled 2013-01-21: qty 10

## 2013-01-21 MED ORDER — DIPHENHYDRAMINE HCL 25 MG PO CAPS
ORAL_CAPSULE | ORAL | Status: AC
  Filled 2013-01-21: qty 1

## 2013-01-21 MED ORDER — ACETAMINOPHEN 325 MG PO TABS
ORAL_TABLET | ORAL | Status: AC
  Filled 2013-01-21: qty 2

## 2013-01-21 NOTE — Patient Instructions (Addendum)
You will be transfused 2 units of blood today to address your anemai Continue weekly labs as scheduled Follow up in 2 weeks, prior to your next cycle of chemotherapy Keep the appointment with the gastroenterologist to be evaluated for your heme positive stools

## 2013-01-21 NOTE — Patient Instructions (Signed)
Blood Transfusion Information WHAT IS A BLOOD TRANSFUSION? A transfusion is the replacement of blood or some of its parts. Blood is made up of multiple cells which provide different functions.  Red blood cells carry oxygen and are used for blood loss replacement.  White blood cells fight against infection.  Platelets control bleeding.  Plasma helps clot blood.  Other blood products are available for specialized needs, such as hemophilia or other clotting disorders. BEFORE THE TRANSFUSION  Who gives blood for transfusions?   You may be able to donate blood to be used at a later date on yourself (autologous donation).  Relatives can be asked to donate blood. This is generally not any safer than if you have received blood from a stranger. The same precautions are taken to ensure safety when a relative's blood is donated.  Healthy volunteers who are fully evaluated to make sure their blood is safe. This is blood bank blood. Transfusion therapy is the safest it has ever been in the practice of medicine. Before blood is taken from a donor, a complete history is taken to make sure that person has no history of diseases nor engages in risky social behavior (examples are intravenous drug use or sexual activity with multiple partners). The donor's travel history is screened to minimize risk of transmitting infections, such as malaria. The donated blood is tested for signs of infectious diseases, such as HIV and hepatitis. The blood is then tested to be sure it is compatible with you in order to minimize the chance of a transfusion reaction. If you or a relative donates blood, this is often done in anticipation of surgery and is not appropriate for emergency situations. It takes many days to process the donated blood. RISKS AND COMPLICATIONS Although transfusion therapy is very safe and saves many lives, the main dangers of transfusion include:   Getting an infectious disease.  Developing a  transfusion reaction. This is an allergic reaction to something in the blood you were given. Every precaution is taken to prevent this. The decision to have a blood transfusion has been considered carefully by your caregiver before blood is given. Blood is not given unless the benefits outweigh the risks. AFTER THE TRANSFUSION  Right after receiving a blood transfusion, you will usually feel much better and more energetic. This is especially true if your red blood cells have gotten low (anemic). The transfusion raises the level of the red blood cells which carry oxygen, and this usually causes an energy increase.  The nurse administering the transfusion will monitor you carefully for complications. HOME CARE INSTRUCTIONS  No special instructions are needed after a transfusion. You may find your energy is better. Speak with your caregiver about any limitations on activity for underlying diseases you may have. SEEK MEDICAL CARE IF:   Your condition is not improving after your transfusion.  You develop redness or irritation at the intravenous (IV) site. SEEK IMMEDIATE MEDICAL CARE IF:  Any of the following symptoms occur over the next 12 hours:  Shaking chills.  You have a temperature by mouth above 102 F (38.9 C), not controlled by medicine.  Chest, back, or muscle pain.  People around you feel you are not acting correctly or are confused.  Shortness of breath or difficulty breathing.  Dizziness and fainting.  You get a rash or develop hives.  You have a decrease in urine output.  Your urine turns a dark color or changes to pink, red, or brown. Any of the following   symptoms occur over the next 10 days:  You have a temperature by mouth above 102 F (38.9 C), not controlled by medicine.  Shortness of breath.  Weakness after normal activity.  The white part of the eye turns yellow (jaundice).  You have a decrease in the amount of urine or are urinating less often.  Your  urine turns a dark color or changes to pink, red, or brown. Document Released: 03/24/2000 Document Revised: 06/19/2011 Document Reviewed: 11/11/2007 [employer] Patient Information 2014 [employer].  

## 2013-01-21 NOTE — Telephone Encounter (Signed)
gv andprinted appt sched and avs for pt for OCT emailed MW to add tx...  °

## 2013-01-21 NOTE — Progress Notes (Addendum)
[facility] [phone]   [phone]  SHARED VISIT PROGRESS NOTE  ROBERTS, [name], MD [address] [place] [address]  PRINCIPAL DIAGNOSIS: Recurrent small cell lung cancer initially diagnosed as Limited-stage small cell lung cancer diagnosed in April of 2012.   PRIOR THERAPY:  1. Status post systemic chemotherapy with carboplatin and etoposide, last dose was given 09/30/2010. This was concurrent with radiotherapy under the care of Dr. [name]. 2. Status post prophylactic cranial irradiation completed on November 29, 2010.  CURRENT THERAPY: Systemic chemotherapy again with carboplatin for AUC of 5 on day 1 and etoposide 100 mg/M2 on days 1, 2 and 3 with Neulasta support on day 4. First dose given on 01/14/2013.  CHEMOTHERAPY INTENT: Palliative  CURRENT # OF CHEMOTHERAPY CYCLES:1  CURRENT ANTIEMETICS: Zofran, dexamethasone and Compazine  CURRENT SMOKING STATUS: Former smoker  ORAL CHEMOTHERAPY AND CONSENT: None  CURRENT BISPHOSPHONATES USE: None  PAIN MANAGEMENT: 0/10  NARCOTICS INDUCED CONSTIPATION: None  LIVING WILL AND CODE STATUS: ?   INTERVAL HISTORY: JAICION LAURIE 79 y.o. male returns to the clinic today for followup visit accompanied by his wife.  The patient is feeling fine today except for the baseline shortness of breath and he is currently on home oxygen. He denied having any significant chest pain, cough or hemoptysis. He has no recent weight loss. Has no nausea or vomiting. He reports that his appetite is good sleeping well. He had some problems with constipation. He also had some left leg pain related to the Neulasta injection. He brought his stool cards back today for evaluation. He states that he is known to both Dr. Bosie Clos and Dr. Matthias Hughs related to his GI bleed back in 2012. She's had some lower extremity edema this is been a long-standing intense defect left side more so than the right. He is status  post his first cycle of systemic chemotherapy with again carboplatin and etoposide with Neulasta support and presents for a symptom management visit today.   MEDICAL HISTORY: Past Medical History  Diagnosis Date  . Hypertension   . Heart attack 1991    Inferior- wall MI  treated with emergency PTCA with RCA by Dr. Allyson Sabal, with a perfusion time of 1 hour 55 minutes.  . Emphysema   . Hyperlipidemia   . Allergic rhinitis   . Heart failure   . Cholelithiasis   . Cardiac pacemaker     Dule-chamber permanent pacemaker - [employer] RF device model # [id number], serial # [id number].  Marland Kitchen COPD (chronic obstructive pulmonary disease)     Longstanding COPD. Uses oxygen - 4 L per minute.  . SOB (shortness of breath)     Transthoracic Echo 04/24/12 EF = 55-60%. Showed sinus rhythm with left atrial abnormality, left axis deviation, and almost an atypical pulmonary disease pattern.   . Paroxysmal a-fib     Permanent Transvenous pacemaker insertion by Dr. Lynnea Ferrier in 2012 and is followed by Dr. Royann Shivers.  . Coronary artery disease     Coronary bypass graft in 2000 with a LIMA to his LAD, vein to obtuse marginal branch #1 and to the PDA. Myoview stress test 03/10/09 was nonischemic. Myocardial perfusion Imaging -04/24/12 - Low risk stress nuclear study. No evidence of ischemia, however the fixed perfusion defect is more prominent.   . Carotid artery disease     Had a left carotid endarterectomy with Dopplers performed in 03/2011 that showed a widely patent left  endarterectomy site with no significant disease on the right.  . Oxygen dependent     Longstanding COPD - wear 4 L per minute  . Diabetes     non-insulin-requiring diabetes  . Cancer     Small cell lung cancer. Received chemotherapy and radiation therapy.   . Duodenitis 01/30/11    ALLERGIES:  has No Known Allergies.  MEDICATIONS:  Current Outpatient Prescriptions  Medication Sig Dispense Refill  . acetaminophen (TYLENOL) 325 MG tablet Take  650 mg by mouth every 6 (six) hours as needed.        Marland Kitchen albuterol (PROVENTIL HFA;VENTOLIN HFA) 108 (90 BASE) MCG/ACT inhaler Inhale 2 puffs into the lungs every 6 (six) hours as needed for wheezing.      Marland Kitchen aspirin EC 325 MG tablet Take 325 mg by mouth daily.       . calcium acetate (PHOSLO) 667 MG capsule Take 667 mg by mouth.      . Chlorphen-Pseudoephed-APAP (CORICIDIN D PO) Take 2 tablets by mouth daily as needed.       . clopidogrel (PLAVIX) 75 MG tablet Take 1 tablet by mouth daily.      . DiphenhydrAMINE HCl (BENADRYL ALLERGY PO) Take 1 tablet by mouth as needed.      . feeding supplement (GLUCERNA SHAKE) LIQD Take 237 mLs by mouth 2 (two) times daily before a meal.        . Fluticasone-Salmeterol (ADVAIR DISKUS) 100-50 MCG/DOSE AEPB Inhale 1 puff into the lungs 2 (two) times daily.  60 each  5  . isosorbide mononitrate (IMDUR) 30 MG 24 hr tablet Take 1 tablet by mouth every morning.       . lidocaine-prilocaine (EMLA) cream Apply topically as needed. Apply to port 1 hr before chemotherapy  30 g  0  . loratadine (CLARITIN) 10 MG tablet Take 1 tablet (10 mg total) by mouth daily.  30 tablet  4  . losartan-hydrochlorothiazide (HYZAAR) 50-12.5 MG per tablet Take 1 tablet by mouth daily.  90 tablet  3  . metoprolol tartrate (LOPRESSOR) 25 MG tablet Take 12.5 mg by mouth 2 (two) times daily. 12.5mg  BID      . Multiple Vitamin (MULTIVITAMIN) tablet Take 1 tablet by mouth daily.        . nitroGLYCERIN (NITROSTAT) 0.4 MG SL tablet Place 0.4 mg under the tongue every 5 (five) minutes as needed.        . OXYGEN-HELIUM IN Inhale 6 L into the lungs continuous.      Marland Kitchen Phenyleph-CPM-DM-Aspirin (ALKA-SELTZER PLUS COLD & COUGH PO) Take 2 tablets by mouth daily.      . potassium chloride SA (K-DUR,KLOR-CON) 20 MEQ tablet Take 1 tablet (20 mEq total) by mouth daily.  10 tablet  0  . prochlorperazine (COMPAZINE) 10 MG tablet Take 1 tablet (10 mg total) by mouth every 6 (six) hours as needed.  30 tablet  1  .  simvastatin (ZOCOR) 20 MG tablet Take 20 mg by mouth at bedtime.        Marland Kitchen tiotropium (SPIRIVA HANDIHALER) 18 MCG inhalation capsule Place 1 capsule (18 mcg total) into inhaler and inhale daily.  30 capsule  5  . UNABLE TO FIND Med Name: prostate supplement      . [DISCONTINUED] pantoprazole (PROTONIX) 40 MG tablet Take 40 mg by mouth daily.        No current facility-administered medications for this visit.   Facility-Administered Medications Ordered in Other Visits  Medication Dose Route Frequency Provider  Last Rate Last Dose  . 0.9 %  sodium chloride infusion  250 mL Intravenous Once Zeola Brys E Jim Lundin, PA-C      . acetaminophen (TYLENOL) tablet 650 mg  650 mg Oral Once [employer], PA-C      . diphenhydrAMINE (BENADRYL) capsule 25 mg  25 mg Oral Once [employer], PA-C      . heparin lock flush 100 unit/mL  500 Units Intracatheter Daily PRN Daena Alper E Kaliyan Osbourn, PA-C      . sodium chloride 0.9 % injection 10 mL  10 mL Intracatheter PRN Conni Slipper, PA-C        SURGICAL HISTORY:  Past Surgical History  Procedure Laterality Date  . Pacemaker insertion  2012    For prolonged sinus pause.  Tod Persia placement4/18/12 tip svc    . Carotid endarterectomy  03/2011    With Dopplers performed most recently in 03/2011 that showed a widely patent left endarterectomy site with no significant disease on the right.  . Coronary artery bypass graft  2000    CAD - CABG with a LIMA to his LAD, a vein to the obtuse marginal branch #1 and to the PDA  . Cardiac catheterization  06/2010    Unstable angina - Catheterized by Dr. Tresa Endo was noted to have essentially total occlusion of the native coronary arteries, a patent LIMA to the LAD & revealing a 70% stenosis at the origin of his circumflex and obtuse marginal graft, an 80% PLA lesion beyond graft insertion. Stent in distal right coronary artery. Predilated with 2.5 x 20 TREK balloon. Stent-Vision 2.75 x 23 balloon.  . Coronary angioplasty Right  06/2010    Unstable angina - Predilated with a 2.5 x 20 TREK balloon. Distal right coronary artery via  the saphenous vein graft with placement of a bare metal stent (a Vision 2.75 x 23 mm by Dr. Clarene Duke)    REVIEW OF SYSTEMS:  Constitutional: positive for fatigue Eyes: negative Ears, nose, mouth, throat, and face: negative Respiratory: positive for dyspnea on exertion Cardiovascular: negative Gastrointestinal: positive for constipation and melena Genitourinary:negative Integument/breast: negative Hematologic/lymphatic: negative Musculoskeletal:negative Neurological: negative Behavioral/Psych: negative Endocrine: negative Allergic/Immunologic: negative   PHYSICAL EXAMINATION: General appearance: alert, cooperative, fatigued and no distress Head: Normocephalic, without obvious abnormality, atraumatic Neck: no adenopathy, no JVD, supple, symmetrical, trachea midline and thyroid not enlarged, symmetric, no tenderness/mass/nodules Lymph nodes: Cervical, supraclavicular, and axillary nodes normal. Resp: wheezes bilaterally Back: symmetric, no curvature. ROM normal. No CVA tenderness. Cardio: regular rate and rhythm, S1, S2 normal, no murmur, click, rub or gallop GI: soft, non-tender; bowel sounds normal; no masses,  no organomegaly Extremities: edema 1-2+ pitting edema bilateral lower extremity, greater on the left than on the right Neurologic: Alert and oriented X 3, normal strength and tone. Normal symmetric reflexes. Normal coordination and gait  ECOG PERFORMANCE STATUS: 2 - Symptomatic, <50% confined to bed  Blood pressure 85/50, pulse 79, temperature 96.7 F (35.9 C), temperature source Oral, resp. rate 18, height 5\' 8"  (1.727 m), weight 155 lb 12.8 oz (70.67 kg).  LABORATORY DATA: Lab Results  Component Value Date   WBC 2.5* 01/21/2013   HGB 7.2* 01/21/2013   HCT 22.7* 01/21/2013   MCV 84.9 01/21/2013   PLT 82* 01/21/2013      Chemistry      Component Value Date/Time    NA 138 01/21/2013 1132   NA 138 11/28/2012 1152   NA 137 11/21/2011 0945   K 4.1 01/21/2013 1132  K 4.4 11/28/2012 1152   K 4.5 11/21/2011 0945   CL 99 11/28/2012 1152   CL 100 06/21/2012 0912   CL 96* 11/21/2011 0945   CO2 31* 01/21/2013 1132   CO2 32 11/28/2012 1152   CO2 31 11/21/2011 0945   BUN 24.7 01/21/2013 1132   BUN 20 11/28/2012 1152   BUN 18 11/21/2011 0945   CREATININE 0.9 01/21/2013 1132   CREATININE 1.08 11/28/2012 1152   CREATININE 0.89 02/22/2011 1411      Component Value Date/Time   CALCIUM 8.9 01/21/2013 1132   CALCIUM 9.3 11/28/2012 1152   CALCIUM 9.4 11/21/2011 0945   CALCIUM 7.6* 09/18/2010 1136   ALKPHOS 67 01/21/2013 1132   ALKPHOS 50 11/21/2011 0945   ALKPHOS 70 02/22/2011 1411   AST 14 01/21/2013 1132   AST 24 11/21/2011 0945   AST 13 02/22/2011 1411   ALT 12 01/21/2013 1132   ALT 26 11/21/2011 0945   ALT 9 02/22/2011 1411   BILITOT 0.77 01/21/2013 1132   BILITOT 1.10 11/21/2011 0945   BILITOT 0.5 02/22/2011 1411       RADIOGRAPHIC STUDIES: Ct Chest W Contrast  12/24/2012   CLINICAL DATA:  Lung cancer diagnosed in October 2012. Chemotherapy and radiation therapy completed.  EXAM: CT CHEST WITH CONTRAST  TECHNIQUE: Multidetector CT imaging of the chest was performed during intravenous contrast administration.  CONTRAST:  80mL OMNIPAQUE IOHEXOL 300 MG/ML  SOLN  COMPARISON:  Chest CTs dated 06/21/2012 and 02/22/2012.  FINDINGS: There are no enlarged mediastinal or hilar lymph nodes. Diffuse atherosclerosis of the aorta, great vessels and coronary arteries is again noted status post CABG. Right subclavian pacemaker leads appear unchanged.  There is no pleural or pericardial effusion. Left upper lobe scarring and bronchiectasis are stable, consistent with sequela of prior radiation therapy. There has been interval enlargement of the spiculated nodule involving the superior segment of the left lower lobe, now measuring 11 x 15 mm on image 12. No other enlarging pulmonary  nodules are identified. Extensive bullous emphysematous changes are again noted at both lung bases.  There has been interval enlargement of the previously described lesion involving the dome of the right hepatic lobe. This now measures 4.2 x 3.5 cm and demonstrate central low density and contiguous peripheral enhancement consistent with a metastasis. In addition, there are multiple additional hepatic metastases, including a 3.2 cm lesion in the left lobe on image 60 to and a 2.9 cm lesion in the right lobe on image 66. A right renal cyst and bilateral adrenal nodularity are stable. There are no worrisome osseous findings.  IMPRESSION: 1. Interval development of widespread hepatic metastatic disease.  2. No other evidence of metastatic disease identified.  3. Enlarging spiculated lesion involving the superior segment of the left lower lobe. Given the proximity to the presumed radiation changes in left upper lobe, this could reflect postradiation scarring or mucoid impaction. However, the appearance is concerning for a metachronous primary lung cancer.   Electronically Signed   By: Roxy Horseman   On: 12/24/2012 10:20   US Biopsy  01/06/2013   *RADIOLOGY REPORT*  Indication: History of small cell lung cancer, now with multiple new liver lesions worrisome for metastatic disease.  ULTRASOUND GUIDED LIVER BIOPSY  Comparison: Chest CT - 12/24/2012; 06/21/2012  Medications: Fentanyl 100 mcg IV; Versed 2 mg IV  Total Moderate Sedation time: 12 minutes  Complications: None immediate  Procedure:  Informed written consent was obtained from the patient after a discussion of  the risks, benefits and alternatives to treatment. The patient understands and consents to the procedure.  A timeout was performed prior to the initiation of the procedure.  Ultrasound scanning was performed of the right upper abdominal quadrant demonstrate multiple slightly hypoechoic solid lesions within the liver compatible with findings on recent chest  CT.  Dominant slightly infiltrative appearing hypoechoic lesion with the caudal aspect the right lobe of the liver was targeted for biopsy. The procedure was planned.  The right upper abdomen was prepped and draped in the usual sterile fashion.  The overlying soft tissues were anesthetized with 1% lidocaine with epinephrine.  A 17 gauge, 6.8 cm co-axial needle was advanced into a peripheral aspect of the mass and 4 core biopsies were obtained with an 18 gauge core device under direct ultrasound guidance.  The co-axial needle was removed and hemostasis was obtained with manual compression.  Post procedural scanning was negative for definitive area of hemorrhage.  A dressing was placed.  The patient tolerated the procedure well without immediate post procedural complication.  Impression:  Technically successful ultrasound guided biopsy of dominant slightly infiltrative appearing solid mass within the caudal aspect the right lobe of the liver.   Original Report Authenticated By: Tacey Ruiz, MD    ASSESSMENT AND PLAN: This is a very pleasant 79 years old white male with recurrent small cell lung cancer with multiple liver metastasis. He is again receiving systemic chemotherapy in the form of carboplatin for an AUC of 5 given on day 1 and etoposide at 100 mg per meter square given days 1, 2 and 3 with Neulasta support given on day 4 status post 1 cycle. He presented for symptom management visit today and was found to have a hemoglobin of 7.2 with a platelet count of 82,000. Patient was discussed with also seen by Dr. [name]. His stool cards were positive for occult blood x3. Patient was referred to Dr. Bjorn Pippin for further evaluation and management of his heme positive stool. We will arrange to transfuse the patient total of 2 units of packed red blood cells to address his anemia which is likely to be multifactorial including chemotherapy induced as well as questionable GI bleed. He'll continue with  weekly labs and followup in 2 weeks prior to the start of cycle #2 of his systemic chemotherapy with carboplatin and etoposide with Neulasta support  Norita Meigs E, PA-C  . He was advised to call immediately if he has any concerning symptoms in the interval.  The patient voices understanding of current disease status and treatment options and is in agreement with the current care plan.  All questions were answered. The patient knows to call the clinic with any problems, questions or concerns. We can certainly see the patient much sooner if necessary.   ADDENDUM: Hematology/oncology Attending:  I had a face to face encounter with the patient. I recommended his care plan. This is a very pleasant 79 years old white male recently diagnosed with recurrent small cell lung cancer with multiple liver metastases and he is undergoing systemic chemotherapy with carboplatin and etoposide status post 1 cycle. The patient is feeling fine today except for the fatigue secondary to chemotherapy-induced anemia. I will arrange for the patient to receive 2 units of PRBCs transfusion. His Hemoccult was positive and will refer the patient to Dr. Bosie Clos for evaluation. He would come back for followup visit in 2 weeks with the next cycle of his systemic chemotherapy. He was advised to call immediately if  he has any concerning symptoms in the interval. Lajuana Matte., MD 01/23/2013

## 2013-01-21 NOTE — Telephone Encounter (Signed)
Per staff message and POF I have scheduled appts.  JMW  

## 2013-01-21 NOTE — Progress Notes (Signed)
1415 Patient c/o of "cramping to hands" denies other symtoms. VSS. O2 sat @ 100 % 1425 Patient states cramping gone. Will continue to monitor  1440 Patient c/o BLE " feel tight" mostly to right ankle. VSS. O2 sats @ 100%, denies other symptoms. Tiana Loft PA, notified. Will continue to monitor closely.  1710 patient with no c/o at this time. VSS. O2 sats @ 100%. Patient and wife encouraged to go to ED should patient experience any questionable symptoms that may be heart related and wife aware of symptoms to be aware of. Both wife and patient verbalized understanding. AVS provided. Patient knows to return tomorrow @ 0800 for second unit of PRBCs.

## 2013-01-22 ENCOUNTER — Ambulatory Visit (HOSPITAL_BASED_OUTPATIENT_CLINIC_OR_DEPARTMENT_OTHER): Payer: Medicare Other

## 2013-01-22 VITALS — BP 109/68 | HR 71 | Temp 97.5°F | Resp 20

## 2013-01-22 DIAGNOSIS — D649 Anemia, unspecified: Secondary | ICD-10-CM

## 2013-01-22 MED ORDER — DIPHENHYDRAMINE HCL 25 MG PO CAPS
ORAL_CAPSULE | ORAL | Status: AC
  Filled 2013-01-22: qty 1

## 2013-01-22 MED ORDER — ACETAMINOPHEN 325 MG PO TABS
ORAL_TABLET | ORAL | Status: AC
  Filled 2013-01-22: qty 2

## 2013-01-22 MED ORDER — ACETAMINOPHEN 325 MG PO TABS
650.0000 mg | ORAL_TABLET | Freq: Once | ORAL | Status: AC
  Administered 2013-01-22: 650 mg via ORAL

## 2013-01-22 MED ORDER — HEPARIN SOD (PORK) LOCK FLUSH 100 UNIT/ML IV SOLN
500.0000 [IU] | Freq: Every day | INTRAVENOUS | Status: AC | PRN
  Administered 2013-01-22: 500 [IU]
  Filled 2013-01-22: qty 5

## 2013-01-22 MED ORDER — SODIUM CHLORIDE 0.9 % IJ SOLN
10.0000 mL | INTRAMUSCULAR | Status: AC | PRN
  Administered 2013-01-22: 10 mL
  Filled 2013-01-22: qty 10

## 2013-01-22 MED ORDER — SODIUM CHLORIDE 0.9 % IV SOLN
250.0000 mL | Freq: Once | INTRAVENOUS | Status: AC
  Administered 2013-01-22: 250 mL via INTRAVENOUS

## 2013-01-22 MED ORDER — DIPHENHYDRAMINE HCL 25 MG PO CAPS
25.0000 mg | ORAL_CAPSULE | Freq: Once | ORAL | Status: AC
  Administered 2013-01-22: 25 mg via ORAL

## 2013-01-22 NOTE — Patient Instructions (Signed)
Blood Transfusion Information WHAT IS A BLOOD TRANSFUSION? A transfusion is the replacement of blood or some of its parts. Blood is made up of multiple cells which provide different functions.  Red blood cells carry oxygen and are used for blood loss replacement.  White blood cells fight against infection.  Platelets control bleeding.  Plasma helps clot blood.  Other blood products are available for specialized needs, such as hemophilia or other clotting disorders. BEFORE THE TRANSFUSION  Who gives blood for transfusions?   You may be able to donate blood to be used at a later date on yourself (autologous donation).  Relatives can be asked to donate blood. This is generally not any safer than if you have received blood from a stranger. The same precautions are taken to ensure safety when a relative's blood is donated.  Healthy volunteers who are fully evaluated to make sure their blood is safe. This is blood bank blood. Transfusion therapy is the safest it has ever been in the practice of medicine. Before blood is taken from a donor, a complete history is taken to make sure that person has no history of diseases nor engages in risky social behavior (examples are intravenous drug use or sexual activity with multiple partners). The donor's travel history is screened to minimize risk of transmitting infections, such as malaria. The donated blood is tested for signs of infectious diseases, such as HIV and hepatitis. The blood is then tested to be sure it is compatible with you in order to minimize the chance of a transfusion reaction. If you or a relative donates blood, this is often done in anticipation of surgery and is not appropriate for emergency situations. It takes many days to process the donated blood. RISKS AND COMPLICATIONS Although transfusion therapy is very safe and saves many lives, the main dangers of transfusion include:   Getting an infectious disease.  Developing a  transfusion reaction. This is an allergic reaction to something in the blood you were given. Every precaution is taken to prevent this. The decision to have a blood transfusion has been considered carefully by your caregiver before blood is given. Blood is not given unless the benefits outweigh the risks. AFTER THE TRANSFUSION  Right after receiving a blood transfusion, you will usually feel much better and more energetic. This is especially true if your red blood cells have gotten low (anemic). The transfusion raises the level of the red blood cells which carry oxygen, and this usually causes an energy increase.  The nurse administering the transfusion will monitor you carefully for complications. HOME CARE INSTRUCTIONS  No special instructions are needed after a transfusion. You may find your energy is better. Speak with your caregiver about any limitations on activity for underlying diseases you may have. SEEK MEDICAL CARE IF:   Your condition is not improving after your transfusion.  You develop redness or irritation at the intravenous (IV) site. SEEK IMMEDIATE MEDICAL CARE IF:  Any of the following symptoms occur over the next 12 hours:  Shaking chills.  You have a temperature by mouth above 102 F (38.9 C), not controlled by medicine.  Chest, back, or muscle pain.  People around you feel you are not acting correctly or are confused.  Shortness of breath or difficulty breathing.  Dizziness and fainting.  You get a rash or develop hives.  You have a decrease in urine output.  Your urine turns a dark color or changes to pink, red, or brown. Any of the following   symptoms occur over the next 10 days:  You have a temperature by mouth above 102 F (38.9 C), not controlled by medicine.  Shortness of breath.  Weakness after normal activity.  The white part of the eye turns yellow (jaundice).  You have a decrease in the amount of urine or are urinating less often.  Your  urine turns a dark color or changes to pink, red, or brown. Document Released: 03/24/2000 Document Revised: 06/19/2011 Document Reviewed: 11/11/2007 [employer] Patient Information 2014 [employer].  

## 2013-01-23 ENCOUNTER — Telehealth: Payer: Self-pay | Admitting: *Deleted

## 2013-01-23 ENCOUNTER — Encounter: Payer: Self-pay | Admitting: Physician Assistant

## 2013-01-23 DIAGNOSIS — D709 Neutropenia, unspecified: Secondary | ICD-10-CM

## 2013-01-23 LAB — TYPE AND SCREEN
ABO/RH(D): O NEG
Unit division: 0
Unit division: 0

## 2013-01-23 MED ORDER — CIPROFLOXACIN HCL 500 MG PO TABS: 500.0000 mg | ORAL_TABLET | Freq: Two times a day (BID) | ORAL | Status: DC

## 2013-01-23 NOTE — Telephone Encounter (Signed)
PT.'S WBC 0.8 WAS CALLED AND COMPLETED CBC FAXED. PT. WAS SEEN TODAY AT DR.SCHOOLER'S OFFICE. PT. WAS PALE BUT NO PROBLEMS WERE NOTED. THIS NOTE AND THE CBC RESULTS GIVEN TO DR.MOHAMED.

## 2013-01-23 NOTE — Telephone Encounter (Signed)
Per Dr Donnald Garre, pt needs prophylactic antibiotics.  Per Dr Donnald Garre, called in cipro 500mg  BID x 5 days.  Called and left msg on pt's vm with instructions regarding antibiotic and neutropenic pxns.  SLJ

## 2013-01-23 NOTE — Addendum Note (Signed)
Addended by: Caren Griffins on: 01/23/2013 02:28 PM   Modules accepted: Orders

## 2013-01-23 NOTE — Telephone Encounter (Signed)
Received call from pt's wife that pt saw Dr Bosie Clos and Dr Bosie Clos does not want to do an endoscopy at this time due to pt being so high risk with his comorbitities.  He feels that it could be an ulcer causing the bleeding and he has put pt on Prilosec.  Will forward msg to Dr Donnald Garre.  SLJ

## 2013-01-28 ENCOUNTER — Other Ambulatory Visit (HOSPITAL_BASED_OUTPATIENT_CLINIC_OR_DEPARTMENT_OTHER): Payer: Medicare Other | Admitting: Lab

## 2013-01-28 DIAGNOSIS — C3491 Malignant neoplasm of unspecified part of right bronchus or lung: Secondary | ICD-10-CM

## 2013-01-28 DIAGNOSIS — C3410 Malignant neoplasm of upper lobe, unspecified bronchus or lung: Secondary | ICD-10-CM

## 2013-01-28 LAB — COMPREHENSIVE METABOLIC PANEL (CC13)
ALT: 14 U/L (ref 0–55)
Alkaline Phosphatase: 79 U/L (ref 40–150)
Anion Gap: 9 mEq/L (ref 3–11)
CO2: 28 mEq/L (ref 22–29)
Calcium: 8.9 mg/dL (ref 8.4–10.4)
Chloride: 102 mEq/L (ref 98–109)
Sodium: 139 mEq/L (ref 136–145)
Total Protein: 6.6 g/dL (ref 6.4–8.3)

## 2013-01-28 LAB — CBC WITH DIFFERENTIAL/PLATELET
BASO%: 0.7 % (ref 0.0–2.0)
Basophils Absolute: 0 10*3/uL (ref 0.0–0.1)
Eosinophils Absolute: 0.1 10*3/uL (ref 0.0–0.5)
HCT: 29.3 % — ABNORMAL LOW (ref 38.4–49.9)
HGB: 9.7 g/dL — ABNORMAL LOW (ref 13.0–17.1)
LYMPH%: 10.6 % — ABNORMAL LOW (ref 14.0–49.0)
MCHC: 33.2 g/dL (ref 32.0–36.0)
MCV: 84.7 fL (ref 79.3–98.0)
MONO#: 0.7 10*3/uL (ref 0.1–0.9)
NEUT#: 5.8 10*3/uL (ref 1.5–6.5)
RBC: 3.46 10*6/uL — ABNORMAL LOW (ref 4.20–5.82)
WBC: 7.5 10*3/uL (ref 4.0–10.3)
lymph#: 0.8 10*3/uL — ABNORMAL LOW (ref 0.9–3.3)

## 2013-02-04 ENCOUNTER — Telehealth: Payer: Self-pay | Admitting: *Deleted

## 2013-02-04 ENCOUNTER — Other Ambulatory Visit: Payer: Medicare Other | Admitting: Lab

## 2013-02-04 ENCOUNTER — Ambulatory Visit (HOSPITAL_BASED_OUTPATIENT_CLINIC_OR_DEPARTMENT_OTHER): Payer: Medicare Other

## 2013-02-04 ENCOUNTER — Encounter: Payer: Self-pay | Admitting: Internal Medicine

## 2013-02-04 ENCOUNTER — Ambulatory Visit (HOSPITAL_BASED_OUTPATIENT_CLINIC_OR_DEPARTMENT_OTHER): Payer: Medicare Other | Admitting: Internal Medicine

## 2013-02-04 ENCOUNTER — Other Ambulatory Visit (HOSPITAL_BASED_OUTPATIENT_CLINIC_OR_DEPARTMENT_OTHER): Payer: Medicare Other | Admitting: Lab

## 2013-02-04 VITALS — BP 109/58 | HR 78 | Temp 97.3°F | Resp 18 | Ht 68.0 in | Wt 156.9 lb

## 2013-02-04 DIAGNOSIS — C3491 Malignant neoplasm of unspecified part of right bronchus or lung: Secondary | ICD-10-CM

## 2013-02-04 DIAGNOSIS — C3410 Malignant neoplasm of upper lobe, unspecified bronchus or lung: Secondary | ICD-10-CM

## 2013-02-04 DIAGNOSIS — D6481 Anemia due to antineoplastic chemotherapy: Secondary | ICD-10-CM

## 2013-02-04 DIAGNOSIS — Z452 Encounter for adjustment and management of vascular access device: Secondary | ICD-10-CM

## 2013-02-04 DIAGNOSIS — Z5111 Encounter for antineoplastic chemotherapy: Secondary | ICD-10-CM

## 2013-02-04 DIAGNOSIS — C787 Secondary malignant neoplasm of liver and intrahepatic bile duct: Secondary | ICD-10-CM

## 2013-02-04 DIAGNOSIS — R5381 Other malaise: Secondary | ICD-10-CM

## 2013-02-04 DIAGNOSIS — R0609 Other forms of dyspnea: Secondary | ICD-10-CM

## 2013-02-04 LAB — COMPREHENSIVE METABOLIC PANEL (CC13)
ALT: 17 U/L (ref 0–55)
AST: 23 U/L (ref 5–34)
Alkaline Phosphatase: 94 U/L (ref 40–150)
Anion Gap: 10 mEq/L (ref 3–11)
CO2: 33 mEq/L — ABNORMAL HIGH (ref 22–29)
Calcium: 9.5 mg/dL (ref 8.4–10.4)
Chloride: 97 mEq/L — ABNORMAL LOW (ref 98–109)
Creatinine: 1 mg/dL (ref 0.7–1.3)
Potassium: 4.5 mEq/L (ref 3.5–5.1)
Sodium: 140 mEq/L (ref 136–145)
Total Protein: 7 g/dL (ref 6.4–8.3)

## 2013-02-04 LAB — CBC WITH DIFFERENTIAL/PLATELET
BASO%: 0.4 % (ref 0.0–2.0)
EOS%: 0.3 % (ref 0.0–7.0)
MCH: 27.3 pg (ref 27.2–33.4)
MCHC: 32 g/dL (ref 32.0–36.0)
NEUT#: 11 10*3/uL — ABNORMAL HIGH (ref 1.5–6.5)
NEUT%: 84.8 % — ABNORMAL HIGH (ref 39.0–75.0)
RDW: 15.3 % — ABNORMAL HIGH (ref 11.0–14.6)
WBC: 12.9 10*3/uL — ABNORMAL HIGH (ref 4.0–10.3)
lymph#: 1 10*3/uL (ref 0.9–3.3)

## 2013-02-04 MED ORDER — ONDANSETRON 16 MG/50ML IVPB (CHCC)
INTRAVENOUS | Status: AC
  Filled 2013-02-04: qty 16

## 2013-02-04 MED ORDER — ONDANSETRON 16 MG/50ML IVPB (CHCC)
16.0000 mg | Freq: Once | INTRAVENOUS | Status: AC
  Administered 2013-02-04: 16 mg via INTRAVENOUS

## 2013-02-04 MED ORDER — SODIUM CHLORIDE 0.9 % IV SOLN
420.5000 mg | Freq: Once | INTRAVENOUS | Status: AC
  Administered 2013-02-04: 420 mg via INTRAVENOUS
  Filled 2013-02-04: qty 42

## 2013-02-04 MED ORDER — DEXAMETHASONE SODIUM PHOSPHATE 20 MG/5ML IJ SOLN
20.0000 mg | Freq: Once | INTRAMUSCULAR | Status: AC
Start: 2013-02-04 — End: 2013-02-04
  Administered 2013-02-04: 20 mg via INTRAVENOUS

## 2013-02-04 MED ORDER — HEPARIN SOD (PORK) LOCK FLUSH 100 UNIT/ML IV SOLN
500.0000 [IU] | Freq: Once | INTRAVENOUS | Status: AC | PRN
  Administered 2013-02-04: 500 [IU]
  Filled 2013-02-04: qty 5

## 2013-02-04 MED ORDER — SODIUM CHLORIDE 0.9 % IV SOLN
Freq: Once | INTRAVENOUS | Status: AC
  Administered 2013-02-04: 14:00:00 via INTRAVENOUS

## 2013-02-04 MED ORDER — SODIUM CHLORIDE 0.9 % IV SOLN
100.0000 mg/m2 | Freq: Once | INTRAVENOUS | Status: AC
  Administered 2013-02-04: 180 mg via INTRAVENOUS
  Filled 2013-02-04: qty 9

## 2013-02-04 MED ORDER — SODIUM CHLORIDE 0.9 % IJ SOLN
10.0000 mL | INTRAMUSCULAR | Status: DC | PRN
  Administered 2013-02-04: 10 mL
  Filled 2013-02-04: qty 10

## 2013-02-04 MED ORDER — DEXAMETHASONE SODIUM PHOSPHATE 20 MG/5ML IJ SOLN
INTRAMUSCULAR | Status: AC
  Filled 2013-02-04: qty 5

## 2013-02-04 MED ORDER — ALTEPLASE 2 MG IJ SOLR
2.0000 mg | Freq: Once | INTRAMUSCULAR | Status: AC | PRN
  Administered 2013-02-04: 2 mg
  Filled 2013-02-04: qty 2

## 2013-02-04 NOTE — Progress Notes (Signed)
[facility] [phone]   [phone]  OFFICE PROGRESS NOTE  ROBERTS, [name], MD [address] [place] [address]  PRINCIPAL DIAGNOSIS: Recurrent small cell lung cancer initially diagnosed as Limited-stage small cell lung cancer diagnosed in April of 2012.   PRIOR THERAPY:  1. Status post systemic chemotherapy with carboplatin and etoposide, last dose was given 09/30/2010. This was concurrent with radiotherapy under the care of Dr. [name]. 2. Status post prophylactic cranial irradiation completed on November 29, 2010.  CURRENT THERAPY: Systemic chemotherapy again with carboplatin for AUC of 5 on day 1 and etoposide 100 mg/M2 on days 1, 2 and 3 with Neulasta support on day 4. First dose given on 01/14/2013.   CHEMOTHERAPY INTENT: Palliative  CURRENT # OF CHEMOTHERAPY CYCLES:1  CURRENT ANTIEMETICS: Zofran, dexamethasone and Compazine  CURRENT SMOKING STATUS: Former smoker  ORAL CHEMOTHERAPY AND CONSENT: None  CURRENT BISPHOSPHONATES USE: None  PAIN MANAGEMENT: 0/10  NARCOTICS INDUCED CONSTIPATION: None  LIVING WILL AND CODE STATUS: ?   INTERVAL HISTORY: [name] 79 y.o. male returns to the clinic today for follow up visit accompanied by his wife and stepson. The patient tolerated the first cycle of his treatment fairly well except for the fatigue. He received 2 units of PRBCs transfusion after the first cycle of his chemotherapy. The patient denied having any significant fever or chills. He denied having any nausea or vomiting. He has no chest pain but continues to have shortness breath with exertion was no cough or hemoptysis. The patient denied having any significant weight loss or night sweats.  MEDICAL HISTORY: Past Medical History  Diagnosis Date  . Hypertension   . Heart attack 1991    Inferior- wall MI  treated with emergency PTCA with RCA by Dr. Allyson Sabal, with a perfusion time of 1 hour 55 minutes.  . Emphysema    . Hyperlipidemia   . Allergic rhinitis   . Heart failure   . Cholelithiasis   . Cardiac pacemaker     Dule-chamber permanent pacemaker - [employer] Acc[employer] DR RF device model # [id number], serial # [id number].  Marland Kitchen COPD (chronic obstructive pulmonary disease)     Longstanding COPD. Uses oxygen - 4 L per minute.  . SOB (shortness of breath)     Transthoracic Echo 04/24/12 EF = 55-60%. Showed sinus rhythm with left atrial abnormality, left axis deviation, and almost an atypical pulmonary disease pattern.   . Paroxysmal a-fib     Permanent Transvenous pacemaker insertion by Dr. Lynnea Ferrier in 2012 and is followed by Dr. Royann Shivers.  . Coronary artery disease     Coronary bypass graft in 2000 with a LIMA to his LAD, vein to obtuse marginal branch #1 and to the PDA. Myoview stress test 03/10/09 was nonischemic. Myocardial perfusion Imaging -04/24/12 - Low risk stress nuclear study. No evidence of ischemia, however the fixed perfusion defect is more prominent.   . Carotid artery disease     Had a left carotid endarterectomy with Dopplers performed in 03/2011 that showed a widely patent left endarterectomy site with no significant disease on the right.  . Oxygen dependent     Longstanding COPD - wear 4 L per minute  . Diabetes     non-insulin-requiring diabetes  . Cancer     Small cell lung cancer. Received chemotherapy and radiation therapy.   . Duodenitis 01/30/11    ALLERGIES:  has No Known Allergies.  MEDICATIONS:  Current Outpatient  Prescriptions  Medication Sig Dispense Refill  . acetaminophen (TYLENOL) 325 MG tablet Take 650 mg by mouth every 6 (six) hours as needed.        Marland Kitchen albuterol (PROVENTIL HFA;VENTOLIN HFA) 108 (90 BASE) MCG/ACT inhaler Inhale 2 puffs into the lungs every 6 (six) hours as needed for wheezing.      Marland Kitchen aspirin EC 325 MG tablet Take 325 mg by mouth daily.       . calcium acetate (PHOSLO) 667 MG capsule Take 667 mg by mouth.      . Chlorphen-Pseudoephed-APAP (CORICIDIN D PO) Take 2  tablets by mouth daily as needed.       . clopidogrel (PLAVIX) 75 MG tablet Take 1 tablet by mouth daily.      . DiphenhydrAMINE HCl (BENADRYL ALLERGY PO) Take 1 tablet by mouth as needed.      . feeding supplement (GLUCERNA SHAKE) LIQD Take 237 mLs by mouth 2 (two) times daily before a meal.        . Fluticasone-Salmeterol (ADVAIR DISKUS) 100-50 MCG/DOSE AEPB Inhale 1 puff into the lungs 2 (two) times daily.  60 each  5  . isosorbide mononitrate (IMDUR) 30 MG 24 hr tablet Take 1 tablet by mouth every morning.       . lidocaine-prilocaine (EMLA) cream Apply topically as needed. Apply to port 1 hr before chemotherapy  30 g  0  . loratadine (CLARITIN) 10 MG tablet Take 1 tablet (10 mg total) by mouth daily.  30 tablet  4  . losartan-hydrochlorothiazide (HYZAAR) 50-12.5 MG per tablet Take 1 tablet by mouth daily.  90 tablet  3  . metoprolol tartrate (LOPRESSOR) 25 MG tablet Take 12.5 mg by mouth 2 (two) times daily. 12.5mg  BID      . Multiple Vitamin (MULTIVITAMIN) tablet Take 1 tablet by mouth daily.        . OXYGEN-HELIUM IN Inhale 6 L into the lungs continuous.      . simvastatin (ZOCOR) 20 MG tablet Take 20 mg by mouth at bedtime.        Marland Kitchen tiotropium (SPIRIVA HANDIHALER) 18 MCG inhalation capsule Place 1 capsule (18 mcg total) into inhaler and inhale daily.  30 capsule  5  . ciprofloxacin (CIPRO) 500 MG tablet Take 1 tablet (500 mg total) by mouth 2 (two) times daily. X 5 days  10 tablet  0  . nitroGLYCERIN (NITROSTAT) 0.4 MG SL tablet Place 0.4 mg under the tongue every 5 (five) minutes as needed.        Marland Kitchen Phenyleph-CPM-DM-Aspirin (ALKA-SELTZER PLUS COLD & COUGH PO) Take 2 tablets by mouth daily.      . prochlorperazine (COMPAZINE) 10 MG tablet Take 1 tablet (10 mg total) by mouth every 6 (six) hours as needed.  30 tablet  1  . UNABLE TO FIND Med Name: prostate supplement      . [DISCONTINUED] pantoprazole (PROTONIX) 40 MG tablet Take 40 mg by mouth daily.        No current  facility-administered medications for this visit.    SURGICAL HISTORY:  Past Surgical History  Procedure Laterality Date  . Pacemaker insertion  2012    For prolonged sinus pause.  Tod Persia placement4/18/12 tip svc    . Carotid endarterectomy  03/2011    With Dopplers performed most recently in 03/2011 that showed a widely patent left endarterectomy site with no significant disease on the right.  . Coronary artery bypass graft  2000    CAD - CABG  with a LIMA to his LAD, a vein to the obtuse marginal branch #1 and to the PDA  . Cardiac catheterization  06/2010    Unstable angina - Catheterized by Dr. Tresa Endo was noted to have essentially total occlusion of the native coronary arteries, a patent LIMA to the LAD & revealing a 70% stenosis at the origin of his circumflex and obtuse marginal graft, an 80% PLA lesion beyond graft insertion. Stent in distal right coronary artery. Predilated with 2.5 x 20 TREK balloon. Stent-Vision 2.75 x 23 balloon.  . Coronary angioplasty Right 06/2010    Unstable angina - Predilated with a 2.5 x 20 TREK balloon. Distal right coronary artery via  the saphenous vein graft with placement of a bare metal stent (a Vision 2.75 x 23 mm by Dr. Clarene Duke)    REVIEW OF SYSTEMS:  Constitutional: positive for fatigue Eyes: negative Ears, nose, mouth, throat, and face: negative Respiratory: negative Cardiovascular: negative Gastrointestinal: negative Genitourinary:negative Integument/breast: negative Hematologic/lymphatic: negative Musculoskeletal:negative Neurological: negative Behavioral/Psych: negative Endocrine: negative Allergic/Immunologic: negative   PHYSICAL EXAMINATION: General appearance: alert, cooperative, fatigued and no distress Head: Normocephalic, without obvious abnormality, atraumatic Neck: no adenopathy, no JVD, supple, symmetrical, trachea midline and thyroid not enlarged, symmetric, no tenderness/mass/nodules Lymph nodes: Cervical,  supraclavicular, and axillary nodes normal. Resp: clear to auscultation bilaterally Back: symmetric, no curvature. ROM normal. No CVA tenderness. Cardio: regular rate and rhythm, S1, S2 normal, no murmur, click, rub or gallop GI: soft, non-tender; bowel sounds normal; no masses,  no organomegaly Extremities: extremities normal, atraumatic, no cyanosis or edema Neurologic: Alert and oriented X 3, normal strength and tone. Normal symmetric reflexes. Normal coordination and gait  ECOG PERFORMANCE STATUS: 2 - Symptomatic, <50% confined to bed  Blood pressure 109/58, pulse 78, temperature 97.3 F (36.3 C), temperature source Oral, resp. rate 18, height 5\' 8"  (1.727 m), weight 156 lb 14.4 oz (71.169 kg).  LABORATORY DATA: Lab Results  Component Value Date   WBC 12.9* 02/04/2013   HGB 9.7* 02/04/2013   HCT 30.4* 02/04/2013   MCV 85.1 02/04/2013   PLT 168 02/04/2013      Chemistry      Component Value Date/Time   NA 140 02/04/2013 0938   NA 138 11/28/2012 1152   NA 137 11/21/2011 0945   K 4.5 02/04/2013 0938   K 4.4 11/28/2012 1152   K 4.5 11/21/2011 0945   CL 99 11/28/2012 1152   CL 100 06/21/2012 0912   CL 96* 11/21/2011 0945   CO2 33* 02/04/2013 0938   CO2 32 11/28/2012 1152   CO2 31 11/21/2011 0945   BUN 10.3 02/04/2013 0938   BUN 20 11/28/2012 1152   BUN 18 11/21/2011 0945   CREATININE 1.0 02/04/2013 0938   CREATININE 1.08 11/28/2012 1152   CREATININE 0.89 02/22/2011 1411      Component Value Date/Time   CALCIUM 9.5 02/04/2013 0938   CALCIUM 9.3 11/28/2012 1152   CALCIUM 9.4 11/21/2011 0945   CALCIUM 7.6* 09/18/2010 1136   ALKPHOS 94 02/04/2013 0938   ALKPHOS 50 11/21/2011 0945   ALKPHOS 70 02/22/2011 1411   AST 23 02/04/2013 0938   AST 24 11/21/2011 0945   AST 13 02/22/2011 1411   ALT 17 02/04/2013 0938   ALT 26 11/21/2011 0945   ALT 9 02/22/2011 1411   BILITOT 0.45 02/04/2013 0938   BILITOT 1.10 11/21/2011 0945   BILITOT 0.5 02/22/2011 1411       RADIOGRAPHIC  STUDIES: No results found.  ASSESSMENT AND  PLAN:  This is a very pleasant 79 years old white male recently diagnosed with recurrent small cell lung cancer with multiple liver metastases and he is undergoing systemic chemotherapy with carboplatin and etoposide status post 1 cycle. The patient is feeling fine today except for the fatigue secondary to chemotherapy-induced anemia. He would come back for followup visit in 3 weeks with the next cycle of his systemic chemotherapy after repeating CT scan of the chest, abdomen and pelvis for restaging of his disease.  His wife had several questions today and I answered them completely to her satisfaction. He was advised to call immediately if he has any concerning symptoms in the interval  The patient voices understanding of current disease status and treatment options and is in agreement with the current care plan.  All questions were answered. The patient knows to call the clinic with any problems, questions or concerns. We can certainly see the patient much sooner if necessary.  I spent 15 minutes counseling the patient face to face. The total time spent in the appointment was 25 minutes.

## 2013-02-04 NOTE — Progress Notes (Signed)
Cath flo placed. Only got 3 mls blood back after 1 hour. Line flushed with NS and Heparin then D/Cd

## 2013-02-04 NOTE — Patient Instructions (Signed)
Followup visit in 3 weeks with repeat CT scan of the chest, abdomen and pelvis for restaging of your disease. 

## 2013-02-04 NOTE — Telephone Encounter (Signed)
Per staff message and POF I have scheduled appts.  JMW  

## 2013-02-04 NOTE — Telephone Encounter (Signed)
appts made and printed. Pt is aware that tx's will be added. i emailed MW to add the tx's. Pt is ware that cs will call w/ appts for CT abd/plevis/chest..gv contrast...td

## 2013-02-04 NOTE — Patient Instructions (Signed)
[facility] Discharge Instructions for Patients Receiving Chemotherapy  Today you received the following chemotherapy agents Carboplatin/Etoposide  To help prevent nausea and vomiting after your treatment, we encourage you to take your nausea medication as needed   If you develop nausea and vomiting that is not controlled by your nausea medication, call the clinic.   BELOW ARE SYMPTOMS THAT SHOULD BE REPORTED IMMEDIATELY:  *FEVER GREATER THAN 100.5 F  *CHILLS WITH OR WITHOUT FEVER  NAUSEA AND VOMITING THAT IS NOT CONTROLLED WITH YOUR NAUSEA MEDICATION  *UNUSUAL SHORTNESS OF BREATH  *UNUSUAL BRUISING OR BLEEDING  TENDERNESS IN MOUTH AND THROAT WITH OR WITHOUT PRESENCE OF ULCERS  *URINARY PROBLEMS  *BOWEL PROBLEMS  UNUSUAL RASH Items with * indicate a potential emergency and should be followed up as soon as possible.  Feel free to call the clinic you have any questions or concerns. The clinic phone number is [phone].    

## 2013-02-05 ENCOUNTER — Other Ambulatory Visit: Payer: Self-pay | Admitting: Physician Assistant

## 2013-02-05 ENCOUNTER — Ambulatory Visit (HOSPITAL_BASED_OUTPATIENT_CLINIC_OR_DEPARTMENT_OTHER): Payer: Medicare Other

## 2013-02-05 ENCOUNTER — Ambulatory Visit (HOSPITAL_COMMUNITY)
Admission: RE | Admit: 2013-02-05 | Discharge: 2013-02-05 | Disposition: A | Discharge status: Home / Self Care | Payer: Medicare Other | Source: Ambulatory Visit | Attending: Physician Assistant | Admitting: Physician Assistant

## 2013-02-05 ENCOUNTER — Telehealth: Payer: Self-pay | Admitting: Medical Oncology

## 2013-02-05 VITALS — BP 94/55 | HR 92 | Temp 97.4°F

## 2013-02-05 DIAGNOSIS — C787 Secondary malignant neoplasm of liver and intrahepatic bile duct: Secondary | ICD-10-CM

## 2013-02-05 DIAGNOSIS — Z95828 Presence of other vascular implants and grafts: Secondary | ICD-10-CM

## 2013-02-05 DIAGNOSIS — T82898A Other specified complication of vascular prosthetic devices, implants and grafts, initial encounter: Secondary | ICD-10-CM | POA: Insufficient documentation

## 2013-02-05 DIAGNOSIS — C3410 Malignant neoplasm of upper lobe, unspecified bronchus or lung: Secondary | ICD-10-CM

## 2013-02-05 DIAGNOSIS — Y831 Surgical operation with implant of artificial internal device as the cause of abnormal reaction of the patient, or of later complication, without mention of misadventure at the time of the procedure: Secondary | ICD-10-CM | POA: Insufficient documentation

## 2013-02-05 DIAGNOSIS — Z5111 Encounter for antineoplastic chemotherapy: Secondary | ICD-10-CM

## 2013-02-05 DIAGNOSIS — C3490 Malignant neoplasm of unspecified part of unspecified bronchus or lung: Secondary | ICD-10-CM | POA: Insufficient documentation

## 2013-02-05 DIAGNOSIS — C3491 Malignant neoplasm of unspecified part of right bronchus or lung: Secondary | ICD-10-CM

## 2013-02-05 MED ORDER — IOHEXOL 300 MG/ML  SOLN
10.0000 mL | Freq: Once | INTRAMUSCULAR | Status: AC | PRN
  Administered 2013-02-05: 10 mL via INTRAVENOUS

## 2013-02-05 MED ORDER — PROCHLORPERAZINE MALEATE 10 MG PO TABS
10.0000 mg | ORAL_TABLET | Freq: Once | ORAL | Status: AC
  Administered 2013-02-05: 10 mg via ORAL

## 2013-02-05 MED ORDER — SODIUM CHLORIDE 0.9 % IV SOLN
100.0000 mg/m2 | Freq: Once | INTRAVENOUS | Status: AC
  Administered 2013-02-05: 180 mg via INTRAVENOUS
  Filled 2013-02-05: qty 9

## 2013-02-05 MED ORDER — SODIUM CHLORIDE 0.9 % IV SOLN: Freq: Once | INTRAVENOUS | Status: DC

## 2013-02-05 MED ORDER — PROCHLORPERAZINE MALEATE 10 MG PO TABS
ORAL_TABLET | ORAL | Status: AC
  Filled 2013-02-05: qty 1

## 2013-02-05 NOTE — Progress Notes (Signed)
1225 Patient complains of stinging at IV site. IV site noted to be raised approximately 1 inch, cool to the touch and appear to have normal skin tone. Infusion stopped. Tiana Loft, PA notified. 0 mL returned when attempted to aspirate Etoposide. Heat applied, extremity elevated.  1235 Tiana Loft, PA at chairside. Site appears smaller, patient states the stinging is resolved. Picture taken of infiltration site. Continue heat and elevation per Tiana Loft, PA.  [phone] Nurse reviewed infiltration paperwork with patient, patient's spouse, and patient's sister-in-law. Consent obtained for photography of infiltration site. Infiltration appointments and  patient instructions given to patient.  Patient and patient's wife verbalized understanding of home care of site.  1300 Patient transported to radiology via wheelchair for dye study. Patient to return to chemo infusion room after dye study per Tiana Loft, PA.  1400 Hold remainder of chemotherapy today per Tiana Loft, PA.

## 2013-02-05 NOTE — Patient Instructions (Signed)
[facility] Discharge Instructions for Patients Receiving Chemotherapy  Today you received the following chemotherapy agents: Etoposide.  To help prevent nausea and vomiting after your treatment, we encourage you to take your nausea medication as prescribed.   If you develop nausea and vomiting that is not controlled by your nausea medication, call the clinic.   BELOW ARE SYMPTOMS THAT SHOULD BE REPORTED IMMEDIATELY:  *FEVER GREATER THAN 100.5 F  *CHILLS WITH OR WITHOUT FEVER  NAUSEA AND VOMITING THAT IS NOT CONTROLLED WITH YOUR NAUSEA MEDICATION  *UNUSUAL SHORTNESS OF BREATH  *UNUSUAL BRUISING OR BLEEDING  TENDERNESS IN MOUTH AND THROAT WITH OR WITHOUT PRESENCE OF ULCERS  *URINARY PROBLEMS  *BOWEL PROBLEMS  UNUSUAL RASH Items with * indicate a potential emergency and should be followed up as soon as possible.  Feel free to call the clinic you have any questions or concerns. The clinic phone number is [phone].    

## 2013-02-05 NOTE — Telephone Encounter (Signed)
Port will not aspirate . Pt scheduled for  Port flow study today after chemo. Called this information to infusion room nurse to send pt to radiology after chemo.

## 2013-02-06 ENCOUNTER — Telehealth: Payer: Self-pay | Admitting: Emergency Medicine

## 2013-02-06 ENCOUNTER — Other Ambulatory Visit: Payer: Self-pay | Admitting: Physician Assistant

## 2013-02-06 ENCOUNTER — Encounter: Payer: Self-pay | Admitting: Emergency Medicine

## 2013-02-06 ENCOUNTER — Other Ambulatory Visit: Payer: Self-pay | Admitting: Radiology

## 2013-02-06 ENCOUNTER — Ambulatory Visit (INDEPENDENT_AMBULATORY_CARE_PROVIDER_SITE_OTHER): Payer: Medicare Other | Admitting: Emergency Medicine

## 2013-02-06 ENCOUNTER — Ambulatory Visit (HOSPITAL_BASED_OUTPATIENT_CLINIC_OR_DEPARTMENT_OTHER): Payer: Medicare Other

## 2013-02-06 ENCOUNTER — Telehealth: Payer: Self-pay | Admitting: Cardiovascular Disease

## 2013-02-06 VITALS — BP 93/48 | HR 79 | Temp 97.0°F | Resp 20

## 2013-02-06 VITALS — BP 140/82 | HR 89 | Ht 68.0 in | Wt 162.6 lb

## 2013-02-06 DIAGNOSIS — Z5111 Encounter for antineoplastic chemotherapy: Secondary | ICD-10-CM

## 2013-02-06 DIAGNOSIS — J449 Chronic obstructive pulmonary disease, unspecified: Secondary | ICD-10-CM

## 2013-02-06 DIAGNOSIS — C3491 Malignant neoplasm of unspecified part of right bronchus or lung: Secondary | ICD-10-CM

## 2013-02-06 DIAGNOSIS — C3490 Malignant neoplasm of unspecified part of unspecified bronchus or lung: Secondary | ICD-10-CM

## 2013-02-06 DIAGNOSIS — C787 Secondary malignant neoplasm of liver and intrahepatic bile duct: Secondary | ICD-10-CM

## 2013-02-06 DIAGNOSIS — C3410 Malignant neoplasm of upper lobe, unspecified bronchus or lung: Secondary | ICD-10-CM

## 2013-02-06 MED ORDER — PROCHLORPERAZINE MALEATE 10 MG PO TABS
10.0000 mg | ORAL_TABLET | Freq: Once | ORAL | Status: AC
  Administered 2013-02-06: 10 mg via ORAL

## 2013-02-06 MED ORDER — HEPARIN SOD (PORK) LOCK FLUSH 100 UNIT/ML IV SOLN
500.0000 [IU] | Freq: Once | INTRAVENOUS | Status: AC | PRN
  Administered 2013-02-06: 500 [IU]
  Filled 2013-02-06: qty 5

## 2013-02-06 MED ORDER — PROCHLORPERAZINE MALEATE 10 MG PO TABS
ORAL_TABLET | ORAL | Status: AC
  Filled 2013-02-06: qty 1

## 2013-02-06 MED ORDER — SODIUM CHLORIDE 0.9 % IV SOLN
Freq: Once | INTRAVENOUS | Status: AC
  Administered 2013-02-06: 14:00:00 via INTRAVENOUS

## 2013-02-06 MED ORDER — SODIUM CHLORIDE 0.9 % IV SOLN
100.0000 mg/m2 | Freq: Once | INTRAVENOUS | Status: AC
  Administered 2013-02-06: 180 mg via INTRAVENOUS
  Filled 2013-02-06: qty 9

## 2013-02-06 MED ORDER — SODIUM CHLORIDE 0.9 % IJ SOLN
10.0000 mL | INTRAMUSCULAR | Status: DC | PRN
  Administered 2013-02-06: 10 mL
  Filled 2013-02-06: qty 10

## 2013-02-06 NOTE — Telephone Encounter (Signed)
I spoke with Inetta Fermo and she states the pt is having a port-a-cath revision and needs to stop his plavix. I asked did RB order this and she said it is through Dr. Shirline Frees. I looked thorough the pt chart and Dr. [name] does not follow the pt for his plavix, looks like Dr. Allyson Sabal does so I advised she needs to contact them. Carron Curie, CMA

## 2013-02-06 NOTE — Telephone Encounter (Signed)
Cathy from [facility] called back and advised patients port a cath now working fine.  Not necessary to re do  Please cancel the need for approval of going off plavix.  Call if you have questions.

## 2013-02-06 NOTE — Assessment & Plan Note (Signed)
-   continue current BD's and high flow O2

## 2013-02-06 NOTE — Patient Instructions (Addendum)
[facility] Discharge Instructions for Patients Receiving Chemotherapy  Today you received the following chemotherapy agents: etoposide  To help prevent nausea and vomiting after your treatment, we encourage you to take your nausea medication.  Take it as often as prescribed.     If you develop nausea and vomiting that is not controlled by your nausea medication, call the clinic. If it is after clinic hours your family physician or the after hours number for the clinic or go to the Emergency Department.   BELOW ARE SYMPTOMS THAT SHOULD BE REPORTED IMMEDIATELY:  *FEVER GREATER THAN 100.5 F  *CHILLS WITH OR WITHOUT FEVER  NAUSEA AND VOMITING THAT IS NOT CONTROLLED WITH YOUR NAUSEA MEDICATION  *UNUSUAL SHORTNESS OF BREATH  *UNUSUAL BRUISING OR BLEEDING  TENDERNESS IN MOUTH AND THROAT WITH OR WITHOUT PRESENCE OF ULCERS  *URINARY PROBLEMS  *BOWEL PROBLEMS  UNUSUAL RASH Items with * indicate a potential emergency and should be followed up as soon as possible.  Feel free to call the clinic you have any questions or concerns. The clinic phone number is [phone].   I have been informed and understand all the instructions given to me. I know to contact the clinic, my physician, or go to the Emergency Department if any problems should occur. I do not have any questions at this time, but understand that I may call the clinic during office hours   should I have any questions or need assistance in obtaining follow up care.    __________________________________________  _____________  __________ Signature of Patient or Authorized Representative            Date                   Time    __________________________________________ Nurse's Signature   Bendamustine Injection What is this medicine? BENDAMUSTINE (BEN da MUS teen) is an anti-cancer drug used to treat a certain type of leukemia. This medicine may be used for other purposes; ask your health care provider  or pharmacist if you have questions. What should I tell my health care provider before I take this medicine? They need to know if you have any of these conditions: -kidney disease -liver disease -an unusual or allergic reaction to bendamustine, mannitol, other medicines, foods, dyes, or preservatives -pregnant or trying to get pregnant -breast-feeding How should I use this medicine? This medicine is for infusion into a vein. It is given by a health care professional in a hospital or clinic setting. Talk to your pediatrician regarding the use of this medicine in children. Special care may be needed. Overdosage: If you think you have taken too much of this medicine contact a poison control center or emergency room at once. NOTE: This medicine is only for you. Do not share this medicine with others. What if I miss a dose? It is important not to miss your dose. Call your doctor or health care professional if you are unable to keep an appointment. What may interact with this medicine? Do not take this medicine with any of the following medications: -clozapine This medicine may also interact with the following medications: -atazanavir -cimetidine -ciprofloxacin -enoxacin -fluvoxamine -medicines for seizures like carbamazepine and phenobarbital -mexiletine -rifampin -tacrine -thiabendazole -zileuton This list may not describe all possible interactions. Give your health care provider a list of all the medicines, herbs, non-prescription drugs, or dietary supplements you use. Also tell them if you smoke, drink alcohol, or use illegal drugs. Some items may interact with  your medicine. What should I watch for while using this medicine? Your condition will be monitored carefully while you are receiving this medicine. This drug may make you feel generally unwell. This is not uncommon, as chemotherapy can affect healthy cells as well as cancer cells. Report any side effects. Continue your course of  treatment even though you feel ill unless your doctor tells you to stop. Call your doctor or health care professional for advice if you get a fever, chills or sore throat, or other symptoms of a cold or flu. Do not treat yourself. This drug decreases your body's ability to fight infections. Try to avoid being around people who are sick. This medicine may increase your risk to bruise or bleed. Call your doctor or health care professional if you notice any unusual bleeding. Be careful brushing and flossing your teeth or using a toothpick because you may get an infection or bleed more easily. If you have any dental work done, tell your dentist you are receiving this medicine. Avoid taking products that contain aspirin, acetaminophen, ibuprofen, naproxen, or ketoprofen unless instructed by your doctor. These medicines may hide a fever. Do not become pregnant while taking this medicine. Women should inform their doctor if they wish to become pregnant or think they might be pregnant. There is a potential for serious side effects to an unborn child. Men should inform their doctors if they wish to father a child. This medicine may lower sperm counts. Talk to your health care professional or pharmacist for more information. Do not breast-feed an infant while taking this medicine. What side effects may I notice from receiving this medicine? Side effects that you should report to your doctor or health care professional as soon as possible: -allergic reactions like skin rash, itching or hives, swelling of the face, lips, or tongue -low blood counts - this medicine may decrease the number of white blood cells, red blood cells and platelets. You may be at increased risk for infections and bleeding. -signs of infection - fever or chills, cough, sore throat, pain or difficulty passing urine -signs of decreased platelets or bleeding - bruising, pinpoint red spots on the skin, black, tarry stools, blood in the  urine -signs of decreased red blood cells - unusually weak or tired, fainting spells, lightheadedness -trouble passing urine or change in the amount of urine Side effects that usually do not require medical attention (report to your doctor or health care professional if they continue or are bothersome): -diarrhea This list may not describe all possible side effects. Call your doctor for medical advice about side effects. You may report side effects to FDA at 1-800-FDA-1088. Where should I keep my medicine? This drug is given in a hospital or clinic and will not be stored at home. NOTE: This sheet is a summary. It may not cover all possible information. If you have questions about this medicine, talk to your doctor, pharmacist, or health care provider.  2012, [employer]vier/Gold Standard. (07/01/2007 3:58:27 PM) ` Apply heat to rt forearm and elevate extremity. Call clinic for worsening symptoms or any other concerns or questions. Return for follow up on 10/31 and in one week.

## 2013-02-06 NOTE — Progress Notes (Signed)
Pt in today for VP-16 infusion. PAC already accessed from 10/29.  Pt was sent to IR for assessment of PAC secondary to no blood return on 10/29. Today drew back 10 ml blood and PAC flushes easily. Pt's right forearm measures aprox 11 inches in circumference. Left forearm measures 10 inches in circumference. Extravasation site from 10/29 has mild swelling/ puffiness, cool to touch, no redness. Pt states site is painful only when he "presses" on site and rates that pain a 2 out of 10 on the pain scale. Pt was given instructions on 02/05/13 by RN to elevate extremity and apply heat. Pt was asked if he elevated extremity or applied heat in the last 24 hours and he states he did not. Rt arm was elevated during treatment today and heat was applied to site. Pt and wife were given oral and written instuctions to apply heat to site and elevate extremity over the next 24 hours; to return for follow up apt on 10/31 and in one week. Instructions were given to call clinic with worsening symptoms or any other concerns or problems. Pt and wife verbalized understanding of all.

## 2013-02-06 NOTE — Telephone Encounter (Signed)
Returned call.  Confirmed w/ Lynden Ang that port-a-cath is working fine and no intervention needed.  Will call back if anything changes.

## 2013-02-06 NOTE — Telephone Encounter (Signed)
Patient having a port a cath placement on 11/5.[name] Will need to be off plavix for 5 days.  Please call with instructions asap  Needs ok to take off plavix today

## 2013-02-06 NOTE — Assessment & Plan Note (Signed)
With recurrence. Now on chemoRx - following with Dr [name], repeat Ct scan in Nov 2014

## 2013-02-06 NOTE — Patient Instructions (Signed)
Please continue your inhaled medications and oxygen as you are using it.  Follow with Dr [name] in 3 months or sooner if you have any problems.

## 2013-02-06 NOTE — Progress Notes (Deleted)
Infiltration site on rt forearm from yesterday has mild swelling,no redness,cool to touch, pt states he has not put heat on site even though he was instructed to yesterday. He states he has mild amt of stinging in forearm but only when he presses on site. He rates his pain 2 out of 10 on the pain scale.

## 2013-02-06 NOTE — Progress Notes (Signed)
Subjective:    Patient ID: [name], male    DOB: 05/16/1930, 79 y.o.   MRN: [id number] HPI 79 yo man, former tobacco 100 pk-yr, CAD s/p CABG and PTCI, hx small cell lung CA in 3/12 s/p chemo and XRT last in 6/12, just completed whole brain radiation. F/u imaging with improvement in the small cell CA, to have repeat CT in October. He presents today with fatigue, exertional dyspnea. Had been on Advair prior to the lung CA. He does not have SABA at this time. He was started on O2, doesn't wear reliably, needs it w exertion.   ROV 12/30/10 -- follow up, hx CAD, tobacco w COPD, small cell lung CA s/p chemo and XRT.  Last time we added Spiriva to Advair - he believes that his breathing is much better. He has not been wearing his his O2 as reliably. He had PFT in April '12. He has some congestion/URI type symptoms. Improved. Still with some UA irritation.   ROV 03/29/11 -- follow up, hx CAD, tobacco w COPD, small cell lung CA s/p chemo and XRT.  He has finished brain XRT. Was admitted to [facility] with severe nausea, GERD, wt loss.  He is doing much better now - planning for CT chest and brain 04/13/10 Julien Nordmann and [place]). He is tolerating Advair and Spiriva. Hoarse voice is better now that GERD is treated. He has not needed SABA.   ROV 06/28/11 -- COPD with SCLCA s/p chemo + XRT with great response. Due for repeat CT scan in April. He has been much more active, has more energy. Breathing well, is not using O2 frequently. He is on Advair, Spiriva. Has SABA available.   ROV 09/21/11 -- COPD with SCLCA s/p chemo + XRT with great response. His wife says that he has not been doing well, was treated for bronchitis in April w abx and resolved. Then had episode hemoptysis, prompted more abx and a break from ASA and plavix. Had CT scan in 4/13 that showed LUL radiation changes.  Has been wearing O2 at all times.   ROV 12/18/11 -- COPD with SCLCA s/p chemo + XRT with great response. Returns for f/u/.  He says that his  breathing has been somewhat worse with exertion and when he is full after a meal. On advair + spiriva. Bothers him in the am. He is skipping/forgetting the spiriva some times. He uses albuterol about 2 -3 x a day.   ROV 04/01/12 -- COPD with SCLCA s/p chemo + XRT with great response. Returns for f/u. He is having much more exertional SOB over the last yr. Using Spiriva and Advair reliably, uses albuterol up to 4x a day. he follows with Dr Gwenlyn Found, ? Whether this is a cardiac limitation.   ROV 05/10/12 -- COPD with SCLCA s/p chemo + XRT with great response. Returns today after PFT to characterize worsening exertional SOB. He also had a low risk perfusion scan 04/24/12 that showed a fixed perfusion defect. His AFL is very severely reduced, DLCO low.  Last time we increased o2 to 4L/min w exertion, 3L/min at rest. He believes this has helped him.     PULMONARY FUNCTON TEST 05/10/2012  FVC 2.14  FEV1 .81  FEV1/FVC 37.9  FVC  % Predicted 56  FEV % Predicted 33  FeF 25-75 .26  FeF 25-75 % Predicted 2.15    Acute OV 07/24/12 --  COPD with SCLCA s/p chemo + XRT with great response. 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 He  called [place] 3-4 d ago for episodic exertional SOB associated with hypoxemia. I ordered O2 titration study. Today he reports that his breathing is improved. He has a bit less congestion, URI seems to be improving.  He doesn't have wheeze. Cough is better.   ROV 10/24/12 --  COPD with SCLCA s/p chemo + XRT with great response. 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 He is having more exertional sob, increased to 5L/min.  Will ask [facility] to titrate O2 and get an oximizer. More nose drainage, bothering his throat. Taking benadryl prn rarely. He is on lisinopril.   ROV 02/06/13 -- COPD, severe AFL on spiro, with SCLCA s/p chemo + XRT with good response. He has now discovered recurrent disease with liver and renal lesions, biopsy proven. Dr [name] is planning for repeat chemo (same regimen as before). He is having progressive SOB with  exertion.  Spiriva + Advair + albuterol prn.     TTE 04/24/12 --  Study Conclusions - Left ventricle: The cavity size was normal. Wall thickness was increased in a pattern of mild LVH. There was mild concentric hypertrophy. Systolic function was normal. The estimated ejection fraction was in the range of 55% to 60%. Wall motion was normal; there were no regional wall motion abnormalities. Doppler parameters are consistent with abnormal left ventricular relaxation (grade 1 diastolic dysfunction). - Mitral valve: Mild regurgitation. Valve area by pressure half-time: 2.06cm^2. - Right ventricle: Systolic pressure was increased. - Atrial septum: No defect or patent foramen ovale was identified. - Tricuspid valve: Mild-moderate regurgitation. - Pulmonary arteries: PA peak pressure: 42mm Hg (S). Impressions: - The right ventricular systolic pressure was increased consistent with moderate pulmonary hypertension.    CT 12/24/12 --  COMPARISON: Chest CTs dated 06/21/2012 and 02/22/2012.  FINDINGS:  There are no enlarged mediastinal or hilar lymph nodes. Diffuse  atherosclerosis of the aorta, great vessels and coronary arteries is  again noted status post CABG. Right subclavian pacemaker leads  appear unchanged.  There is no pleural or pericardial effusion. Left upper lobe  scarring and bronchiectasis are stable, consistent with sequela of  prior radiation therapy. There has been interval enlargement of the  spiculated nodule involving the superior segment of the left lower  lobe, now measuring 11 x 15 mm on image 12. No other enlarging  pulmonary nodules are identified. Extensive bullous emphysematous  changes are again noted at both lung bases.  There has been interval enlargement of the previously described  lesion involving the dome of the right hepatic lobe. This now  measures 4.2 x 3.5 cm and demonstrate central low density and  contiguous peripheral enhancement consistent with  a metastasis. In  addition, there are multiple additional hepatic metastases,  including a 3.2 cm lesion in the left lobe on image 60 to and a 2.9  cm lesion in the right lobe on image 66. A right renal cyst and  bilateral adrenal nodularity are stable. There are no worrisome  osseous findings.  IMPRESSION:  1. Interval development of widespread hepatic metastatic disease.  2. No other evidence of metastatic disease identified.  3. Enlarging spiculated lesion involving the superior segment of the  left lower lobe. Given the proximity to the presumed radiation  changes in left upper lobe, this could reflect postradiation  scarring or mucoid impaction. However, the appearance is concerning  for a metachronous primary lung cancer.    Objective:   Physical Exam Filed Vitals:   02/06/13 1628  BP: 140/82  Pulse: 89  Height: 5'  8" (1.727 m)  Weight: 162 lb 9.6 oz (73.755 kg)  SpO2: 95%   Gen: Pleasant, well-nourished, in no distress,  normal affect  ENT: No lesions,  mouth clear,  oropharynx clear, no postnasal drip, mild hoarse voice  Neck: No JVD, no TMG, no carotid bruits  Lungs: No use of accessory muscles, very distant, no wheeze  Cardiovascular: RRR, heart sounds normal, no murmur or gallops, trace pretibial peripheral edema  Musculoskeletal: No deformities, no cyanosis or clubbing  Neuro: alert, non focal  Skin: Warm, no lesions or rashes     Assessment & Plan:  COPD (chronic obstructive pulmonary disease) - continue current BD's and high flow O2  Small cell lung cancer With recurrence. Now on chemoRx - following with Dr [name], repeat Ct scan in Nov 2014

## 2013-02-07 ENCOUNTER — Ambulatory Visit (HOSPITAL_BASED_OUTPATIENT_CLINIC_OR_DEPARTMENT_OTHER): Payer: Medicare Other

## 2013-02-07 VITALS — BP 117/62 | HR 61 | Temp 98.0°F | Resp 20

## 2013-02-07 DIAGNOSIS — C3410 Malignant neoplasm of upper lobe, unspecified bronchus or lung: Secondary | ICD-10-CM

## 2013-02-07 DIAGNOSIS — C3491 Malignant neoplasm of unspecified part of right bronchus or lung: Secondary | ICD-10-CM

## 2013-02-07 DIAGNOSIS — Z5189 Encounter for other specified aftercare: Secondary | ICD-10-CM

## 2013-02-07 MED ORDER — PEGFILGRASTIM INJECTION 6 MG/0.6ML
6.0000 mg | Freq: Once | SUBCUTANEOUS | Status: AC
  Administered 2013-02-07: 6 mg via SUBCUTANEOUS
  Filled 2013-02-07: qty 0.6

## 2013-02-07 NOTE — Progress Notes (Signed)
Pt arm assessed.

## 2013-02-11 ENCOUNTER — Ambulatory Visit (HOSPITAL_BASED_OUTPATIENT_CLINIC_OR_DEPARTMENT_OTHER): Payer: Medicare Other

## 2013-02-11 ENCOUNTER — Other Ambulatory Visit (HOSPITAL_BASED_OUTPATIENT_CLINIC_OR_DEPARTMENT_OTHER): Payer: Medicare Other | Admitting: Lab

## 2013-02-11 VITALS — BP 102/43 | HR 82 | Temp 97.2°F | Resp 16

## 2013-02-11 DIAGNOSIS — Z452 Encounter for adjustment and management of vascular access device: Secondary | ICD-10-CM

## 2013-02-11 DIAGNOSIS — C3410 Malignant neoplasm of upper lobe, unspecified bronchus or lung: Secondary | ICD-10-CM

## 2013-02-11 DIAGNOSIS — C3430 Malignant neoplasm of lower lobe, unspecified bronchus or lung: Secondary | ICD-10-CM

## 2013-02-11 DIAGNOSIS — Z95828 Presence of other vascular implants and grafts: Secondary | ICD-10-CM

## 2013-02-11 DIAGNOSIS — C3491 Malignant neoplasm of unspecified part of right bronchus or lung: Secondary | ICD-10-CM

## 2013-02-11 LAB — CBC WITH DIFFERENTIAL/PLATELET
Basophils Absolute: 0.1 10*3/uL (ref 0.0–0.1)
EOS%: 0.1 % (ref 0.0–7.0)
Eosinophils Absolute: 0 10*3/uL (ref 0.0–0.5)
HGB: 8.5 g/dL — ABNORMAL LOW (ref 13.0–17.1)
LYMPH%: 3.3 % — ABNORMAL LOW (ref 14.0–49.0)
MCH: 28.2 pg (ref 27.2–33.4)
MCHC: 32.7 g/dL (ref 32.0–36.0)
MCV: 86 fL (ref 79.3–98.0)
MONO%: 2.2 % (ref 0.0–14.0)
NEUT#: 7.7 10*3/uL — ABNORMAL HIGH (ref 1.5–6.5)
Platelets: 137 10*3/uL — ABNORMAL LOW (ref 140–400)
RDW: 15.7 % — ABNORMAL HIGH (ref 11.0–14.6)
lymph#: 0.3 10*3/uL — ABNORMAL LOW (ref 0.9–3.3)

## 2013-02-11 LAB — COMPREHENSIVE METABOLIC PANEL (CC13)
Albumin: 3.5 g/dL (ref 3.5–5.0)
Alkaline Phosphatase: 118 U/L (ref 40–150)
Anion Gap: 10 mEq/L (ref 3–11)
BUN: 22.7 mg/dL (ref 7.0–26.0)
Chloride: 103 mEq/L (ref 98–109)
Creatinine: 0.8 mg/dL (ref 0.7–1.3)
Glucose: 158 mg/dl — ABNORMAL HIGH (ref 70–140)
Total Bilirubin: 0.91 mg/dL (ref 0.20–1.20)

## 2013-02-11 MED ORDER — HEPARIN SOD (PORK) LOCK FLUSH 100 UNIT/ML IV SOLN
500.0000 [IU] | Freq: Once | INTRAVENOUS | Status: AC
Start: 2013-02-11 — End: 2013-02-11
  Administered 2013-02-11: 500 [IU] via INTRAVENOUS
  Filled 2013-02-11: qty 5

## 2013-02-11 MED ORDER — SODIUM CHLORIDE 0.9 % IJ SOLN
10.0000 mL | INTRAMUSCULAR | Status: DC | PRN
  Administered 2013-02-11: 10 mL via INTRAVENOUS
  Filled 2013-02-11: qty 10

## 2013-02-12 ENCOUNTER — Ambulatory Visit (HOSPITAL_COMMUNITY): Payer: Medicare Other

## 2013-02-12 ENCOUNTER — Ambulatory Visit (HOSPITAL_COMMUNITY): Admission: RE | Admit: 2013-02-12 | Payer: Medicare Other | Source: Ambulatory Visit

## 2013-02-12 ENCOUNTER — Inpatient Hospital Stay (HOSPITAL_COMMUNITY): Admission: RE | Admit: 2013-02-12 | Payer: Medicare Other | Source: Ambulatory Visit

## 2013-02-18 ENCOUNTER — Ambulatory Visit (HOSPITAL_COMMUNITY)
Admission: RE | Admit: 2013-02-18 | Discharge: 2013-02-18 | Disposition: A | Discharge status: Home / Self Care | Payer: Medicare Other | Source: Ambulatory Visit | Attending: Internal Medicine | Admitting: Internal Medicine

## 2013-02-18 ENCOUNTER — Ambulatory Visit (HOSPITAL_BASED_OUTPATIENT_CLINIC_OR_DEPARTMENT_OTHER): Payer: Medicare Other | Admitting: Lab

## 2013-02-18 ENCOUNTER — Other Ambulatory Visit: Payer: Self-pay | Admitting: Physician Assistant

## 2013-02-18 ENCOUNTER — Ambulatory Visit (HOSPITAL_BASED_OUTPATIENT_CLINIC_OR_DEPARTMENT_OTHER): Payer: Medicare Other

## 2013-02-18 VITALS — BP 127/69 | HR 80 | Temp 97.7°F | Resp 18

## 2013-02-18 DIAGNOSIS — C3491 Malignant neoplasm of unspecified part of right bronchus or lung: Secondary | ICD-10-CM

## 2013-02-18 DIAGNOSIS — D6481 Anemia due to antineoplastic chemotherapy: Secondary | ICD-10-CM

## 2013-02-18 DIAGNOSIS — C787 Secondary malignant neoplasm of liver and intrahepatic bile duct: Secondary | ICD-10-CM | POA: Insufficient documentation

## 2013-02-18 DIAGNOSIS — C3410 Malignant neoplasm of upper lobe, unspecified bronchus or lung: Secondary | ICD-10-CM

## 2013-02-18 DIAGNOSIS — C3490 Malignant neoplasm of unspecified part of unspecified bronchus or lung: Secondary | ICD-10-CM | POA: Insufficient documentation

## 2013-02-18 DIAGNOSIS — T451X5A Adverse effect of antineoplastic and immunosuppressive drugs, initial encounter: Secondary | ICD-10-CM | POA: Insufficient documentation

## 2013-02-18 LAB — COMPREHENSIVE METABOLIC PANEL (CC13)
ALT: 16 U/L (ref 0–55)
Anion Gap: 9 mEq/L (ref 3–11)
BUN: 13.8 mg/dL (ref 7.0–26.0)
CO2: 31 mEq/L — ABNORMAL HIGH (ref 22–29)
Calcium: 9.5 mg/dL (ref 8.4–10.4)
Chloride: 102 mEq/L (ref 98–109)
Creatinine: 0.9 mg/dL (ref 0.7–1.3)
Sodium: 141 mEq/L (ref 136–145)
Total Protein: 6.5 g/dL (ref 6.4–8.3)

## 2013-02-18 LAB — CBC WITH DIFFERENTIAL/PLATELET
Eosinophils Absolute: 0.1 10*3/uL (ref 0.0–0.5)
HCT: 25.2 % — ABNORMAL LOW (ref 38.4–49.9)
HGB: 7.8 g/dL — ABNORMAL LOW (ref 13.0–17.1)
LYMPH%: 4.6 % — ABNORMAL LOW (ref 14.0–49.0)
MONO#: 1.8 10*3/uL — ABNORMAL HIGH (ref 0.1–0.9)
NEUT#: 21.5 10*3/uL — ABNORMAL HIGH (ref 1.5–6.5)
NEUT%: 86.7 % — ABNORMAL HIGH (ref 39.0–75.0)
Platelets: 32 10*3/uL — ABNORMAL LOW (ref 140–400)
RBC: 2.83 10*6/uL — ABNORMAL LOW (ref 4.20–5.82)
WBC: 24.8 10*3/uL — ABNORMAL HIGH (ref 4.0–10.3)
nRBC: 0 % (ref 0–0)

## 2013-02-18 MED ORDER — ACETAMINOPHEN 325 MG PO TABS
ORAL_TABLET | ORAL | Status: AC
  Filled 2013-02-18: qty 2

## 2013-02-18 MED ORDER — DIPHENHYDRAMINE HCL 25 MG PO CAPS
25.0000 mg | ORAL_CAPSULE | Freq: Once | ORAL | Status: AC
  Administered 2013-02-18: 25 mg via ORAL

## 2013-02-18 MED ORDER — ACETAMINOPHEN 325 MG PO TABS
650.0000 mg | ORAL_TABLET | Freq: Once | ORAL | Status: AC
  Administered 2013-02-18: 650 mg via ORAL

## 2013-02-18 MED ORDER — DIPHENHYDRAMINE HCL 25 MG PO CAPS
ORAL_CAPSULE | ORAL | Status: AC
  Filled 2013-02-18: qty 1

## 2013-02-18 MED ORDER — SODIUM CHLORIDE 0.9 % IJ SOLN
3.0000 mL | INTRAMUSCULAR | Status: DC | PRN
  Filled 2013-02-18: qty 10

## 2013-02-18 MED ORDER — SODIUM CHLORIDE 0.9 % IJ SOLN
10.0000 mL | INTRAMUSCULAR | Status: AC | PRN
  Administered 2013-02-18: 10 mL
  Filled 2013-02-18: qty 10

## 2013-02-18 MED ORDER — HEPARIN SOD (PORK) LOCK FLUSH 100 UNIT/ML IV SOLN
500.0000 [IU] | Freq: Every day | INTRAVENOUS | Status: AC | PRN
  Administered 2013-02-18: 500 [IU]
  Filled 2013-02-18: qty 5

## 2013-02-18 NOTE — Patient Instructions (Signed)
Blood Transfusion Information WHAT IS A BLOOD TRANSFUSION? A transfusion is the replacement of blood or some of its parts. Blood is made up of multiple cells which provide different functions.  Red blood cells carry oxygen and are used for blood loss replacement.  White blood cells fight against infection.  Platelets control bleeding.  Plasma helps clot blood.  Other blood products are available for specialized needs, such as hemophilia or other clotting disorders. BEFORE THE TRANSFUSION  Who gives blood for transfusions?   You may be able to donate blood to be used at a later date on yourself (autologous donation).  Relatives can be asked to donate blood. This is generally not any safer than if you have received blood from a stranger. The same precautions are taken to ensure safety when a relative's blood is donated.  Healthy volunteers who are fully evaluated to make sure their blood is safe. This is blood bank blood. Transfusion therapy is the safest it has ever been in the practice of medicine. Before blood is taken from a donor, a complete history is taken to make sure that person has no history of diseases nor engages in risky social behavior (examples are intravenous drug use or sexual activity with multiple partners). The donor's travel history is screened to minimize risk of transmitting infections, such as malaria. The donated blood is tested for signs of infectious diseases, such as HIV and hepatitis. The blood is then tested to be sure it is compatible with you in order to minimize the chance of a transfusion reaction. If you or a relative donates blood, this is often done in anticipation of surgery and is not appropriate for emergency situations. It takes many days to process the donated blood. RISKS AND COMPLICATIONS Although transfusion therapy is very safe and saves many lives, the main dangers of transfusion include:   Getting an infectious disease.  Developing a  transfusion reaction. This is an allergic reaction to something in the blood you were given. Every precaution is taken to prevent this. The decision to have a blood transfusion has been considered carefully by your caregiver before blood is given. Blood is not given unless the benefits outweigh the risks. AFTER THE TRANSFUSION  Right after receiving a blood transfusion, you will usually feel much better and more energetic. This is especially true if your red blood cells have gotten low (anemic). The transfusion raises the level of the red blood cells which carry oxygen, and this usually causes an energy increase.  The nurse administering the transfusion will monitor you carefully for complications. HOME CARE INSTRUCTIONS  No special instructions are needed after a transfusion. You may find your energy is better. Speak with your caregiver about any limitations on activity for underlying diseases you may have. SEEK MEDICAL CARE IF:   Your condition is not improving after your transfusion.  You develop redness or irritation at the intravenous (IV) site. SEEK IMMEDIATE MEDICAL CARE IF:  Any of the following symptoms occur over the next 12 hours:  Shaking chills.  You have a temperature by mouth above 102 F (38.9 C), not controlled by medicine.  Chest, back, or muscle pain.  People around you feel you are not acting correctly or are confused.  Shortness of breath or difficulty breathing.  Dizziness and fainting.  You get a rash or develop hives.  You have a decrease in urine output.  Your urine turns a dark color or changes to pink, red, or brown. Any of the following   symptoms occur over the next 10 days:  You have a temperature by mouth above 102 F (38.9 C), not controlled by medicine.  Shortness of breath.  Weakness after normal activity.  The white part of the eye turns yellow (jaundice).  You have a decrease in the amount of urine or are urinating less often.  Your  urine turns a dark color or changes to pink, red, or brown. Document Released: 03/24/2000 Document Revised: 06/19/2011 Document Reviewed: 11/11/2007 [employer] Patient Information 2014 [employer].  

## 2013-02-19 LAB — TYPE AND SCREEN
ABO/RH(D): O NEG
Antibody Screen: NEGATIVE
Unit division: 0

## 2013-02-21 ENCOUNTER — Encounter (HOSPITAL_COMMUNITY): Payer: Self-pay

## 2013-02-21 ENCOUNTER — Ambulatory Visit (HOSPITAL_COMMUNITY)
Admission: RE | Admit: 2013-02-21 | Discharge: 2013-02-21 | Disposition: A | Discharge status: Home / Self Care | Payer: Medicare Other | Source: Ambulatory Visit | Attending: Internal Medicine | Admitting: Internal Medicine

## 2013-02-21 DIAGNOSIS — I700 Atherosclerosis of aorta: Secondary | ICD-10-CM | POA: Insufficient documentation

## 2013-02-21 DIAGNOSIS — M51379 Other intervertebral disc degeneration, lumbosacral region without mention of lumbar back pain or lower extremity pain: Secondary | ICD-10-CM | POA: Insufficient documentation

## 2013-02-21 DIAGNOSIS — C3491 Malignant neoplasm of unspecified part of right bronchus or lung: Secondary | ICD-10-CM

## 2013-02-21 DIAGNOSIS — M5137 Other intervertebral disc degeneration, lumbosacral region: Secondary | ICD-10-CM | POA: Insufficient documentation

## 2013-02-21 DIAGNOSIS — C3490 Malignant neoplasm of unspecified part of unspecified bronchus or lung: Secondary | ICD-10-CM | POA: Insufficient documentation

## 2013-02-21 DIAGNOSIS — R911 Solitary pulmonary nodule: Secondary | ICD-10-CM | POA: Insufficient documentation

## 2013-02-21 DIAGNOSIS — J439 Emphysema, unspecified: Secondary | ICD-10-CM | POA: Insufficient documentation

## 2013-02-21 DIAGNOSIS — C787 Secondary malignant neoplasm of liver and intrahepatic bile duct: Secondary | ICD-10-CM | POA: Insufficient documentation

## 2013-02-21 MED ORDER — IOHEXOL 300 MG/ML  SOLN
100.0000 mL | Freq: Once | INTRAMUSCULAR | Status: AC | PRN
  Administered 2013-02-21: 100 mL via INTRAVENOUS

## 2013-02-24 ENCOUNTER — Ambulatory Visit (INDEPENDENT_AMBULATORY_CARE_PROVIDER_SITE_OTHER): Payer: Medicare Other

## 2013-02-24 DIAGNOSIS — I480 Paroxysmal atrial fibrillation: Secondary | ICD-10-CM

## 2013-02-24 DIAGNOSIS — I4891 Unspecified atrial fibrillation: Secondary | ICD-10-CM

## 2013-02-24 LAB — MDC_IDC_ENUM_SESS_TYPE_REMOTE
Battery Voltage: 2.96 V
Brady Statistic RA Percent Paced: 1.6 %
Implantable Pulse Generator Serial Number: 7209123
Lead Channel Impedance Value: 400 Ohm
Lead Channel Pacing Threshold Amplitude: 0.5 V
Lead Channel Pacing Threshold Pulse Width: 0.5 ms
Lead Channel Pacing Threshold Pulse Width: 0.5 ms
Lead Channel Sensing Intrinsic Amplitude: 8.1 mV
Lead Channel Setting Pacing Amplitude: 0.75 V
Lead Channel Setting Pacing Pulse Width: 0.5 ms
Lead Channel Setting Sensing Sensitivity: 2 mV

## 2013-02-24 LAB — PACEMAKER DEVICE OBSERVATION

## 2013-02-25 ENCOUNTER — Encounter: Payer: Self-pay | Admitting: Internal Medicine

## 2013-02-25 ENCOUNTER — Ambulatory Visit (HOSPITAL_BASED_OUTPATIENT_CLINIC_OR_DEPARTMENT_OTHER): Payer: Medicare Other

## 2013-02-25 ENCOUNTER — Other Ambulatory Visit (HOSPITAL_BASED_OUTPATIENT_CLINIC_OR_DEPARTMENT_OTHER): Payer: Medicare Other | Admitting: Lab

## 2013-02-25 ENCOUNTER — Telehealth: Payer: Self-pay | Admitting: Internal Medicine

## 2013-02-25 ENCOUNTER — Ambulatory Visit (HOSPITAL_BASED_OUTPATIENT_CLINIC_OR_DEPARTMENT_OTHER): Payer: Medicare Other | Admitting: Internal Medicine

## 2013-02-25 VITALS — BP 120/61 | HR 76 | Temp 96.8°F | Resp 20 | Ht 68.0 in | Wt 158.9 lb

## 2013-02-25 DIAGNOSIS — C3491 Malignant neoplasm of unspecified part of right bronchus or lung: Secondary | ICD-10-CM

## 2013-02-25 DIAGNOSIS — C787 Secondary malignant neoplasm of liver and intrahepatic bile duct: Secondary | ICD-10-CM

## 2013-02-25 DIAGNOSIS — C3410 Malignant neoplasm of upper lobe, unspecified bronchus or lung: Secondary | ICD-10-CM

## 2013-02-25 DIAGNOSIS — R0609 Other forms of dyspnea: Secondary | ICD-10-CM

## 2013-02-25 DIAGNOSIS — Z5111 Encounter for antineoplastic chemotherapy: Secondary | ICD-10-CM

## 2013-02-25 DIAGNOSIS — C3492 Malignant neoplasm of unspecified part of left bronchus or lung: Secondary | ICD-10-CM

## 2013-02-25 DIAGNOSIS — D6481 Anemia due to antineoplastic chemotherapy: Secondary | ICD-10-CM

## 2013-02-25 LAB — COMPREHENSIVE METABOLIC PANEL (CC13)
ALT: 18 U/L (ref 0–55)
Albumin: 4 g/dL (ref 3.5–5.0)
Alkaline Phosphatase: 112 U/L (ref 40–150)
BUN: 14.2 mg/dL (ref 7.0–26.0)
CO2: 31 mEq/L — ABNORMAL HIGH (ref 22–29)
Chloride: 100 mEq/L (ref 98–109)
Potassium: 4 mEq/L (ref 3.5–5.1)
Sodium: 140 mEq/L (ref 136–145)
Total Bilirubin: 0.64 mg/dL (ref 0.20–1.20)
Total Protein: 7.1 g/dL (ref 6.4–8.3)

## 2013-02-25 LAB — CBC WITH DIFFERENTIAL/PLATELET
BASO%: 0.3 % (ref 0.0–2.0)
LYMPH%: 4.1 % — ABNORMAL LOW (ref 14.0–49.0)
MCHC: 32 g/dL (ref 32.0–36.0)
MONO#: 1.1 10*3/uL — ABNORMAL HIGH (ref 0.1–0.9)
NEUT#: 18.2 10*3/uL — ABNORMAL HIGH (ref 1.5–6.5)
NEUT%: 89.5 % — ABNORMAL HIGH (ref 39.0–75.0)
Platelets: 114 10*3/uL — ABNORMAL LOW (ref 140–400)
RBC: 4.08 10*6/uL — ABNORMAL LOW (ref 4.20–5.82)
RDW: 16.4 % — ABNORMAL HIGH (ref 11.0–14.6)
WBC: 20.4 10*3/uL — ABNORMAL HIGH (ref 4.0–10.3)
lymph#: 0.8 10*3/uL — ABNORMAL LOW (ref 0.9–3.3)

## 2013-02-25 MED ORDER — SODIUM CHLORIDE 0.9 % IV SOLN
100.0000 mg/m2 | Freq: Once | INTRAVENOUS | Status: AC
  Administered 2013-02-25: 180 mg via INTRAVENOUS
  Filled 2013-02-25: qty 9

## 2013-02-25 MED ORDER — SODIUM CHLORIDE 0.9 % IV SOLN
Freq: Once | INTRAVENOUS | Status: AC
  Administered 2013-02-25: 12:00:00 via INTRAVENOUS

## 2013-02-25 MED ORDER — CARBOPLATIN CHEMO INTRADERMAL TEST DOSE 100MCG/0.02ML
100.0000 ug | Freq: Once | INTRADERMAL | Status: AC
  Administered 2013-02-25: 100 ug via INTRADERMAL
  Filled 2013-02-25: qty 0.01

## 2013-02-25 MED ORDER — ONDANSETRON 16 MG/50ML IVPB (CHCC)
16.0000 mg | Freq: Once | INTRAVENOUS | Status: AC
  Administered 2013-02-25: 16 mg via INTRAVENOUS

## 2013-02-25 MED ORDER — SODIUM CHLORIDE 0.9 % IJ SOLN
10.0000 mL | INTRAMUSCULAR | Status: DC | PRN
  Administered 2013-02-25: 10 mL
  Filled 2013-02-25: qty 10

## 2013-02-25 MED ORDER — DEXAMETHASONE SODIUM PHOSPHATE 20 MG/5ML IJ SOLN
20.0000 mg | Freq: Once | INTRAMUSCULAR | Status: AC
  Administered 2013-02-25: 20 mg via INTRAVENOUS

## 2013-02-25 MED ORDER — SODIUM CHLORIDE 0.9 % IV SOLN
336.4000 mg | Freq: Once | INTRAVENOUS | Status: AC
  Administered 2013-02-25: 340 mg via INTRAVENOUS
  Filled 2013-02-25: qty 34

## 2013-02-25 MED ORDER — ONDANSETRON 16 MG/50ML IVPB (CHCC)
INTRAVENOUS | Status: AC
  Filled 2013-02-25: qty 16

## 2013-02-25 MED ORDER — DEXAMETHASONE SODIUM PHOSPHATE 20 MG/5ML IJ SOLN
INTRAMUSCULAR | Status: AC
  Filled 2013-02-25: qty 5

## 2013-02-25 MED ORDER — HEPARIN SOD (PORK) LOCK FLUSH 100 UNIT/ML IV SOLN
500.0000 [IU] | Freq: Once | INTRAVENOUS | Status: AC | PRN
  Administered 2013-02-25: 500 [IU]
  Filled 2013-02-25: qty 5

## 2013-02-25 NOTE — Telephone Encounter (Signed)
gv adn printed appt sched and avs for pt for NOV and DEC...sed added tx. °

## 2013-02-25 NOTE — Patient Instructions (Signed)
[facility] Discharge Instructions for Patients Receiving Chemotherapy  Today you received the following chemotherapy agents: Carboplatin and Etoposide   To help prevent nausea and vomiting after your treatment, we encourage you to take your nausea medication as prescribed.    If you develop nausea and vomiting that is not controlled by your nausea medication, call the clinic.   BELOW ARE SYMPTOMS THAT SHOULD BE REPORTED IMMEDIATELY:  *FEVER GREATER THAN 100.5 F  *CHILLS WITH OR WITHOUT FEVER  NAUSEA AND VOMITING THAT IS NOT CONTROLLED WITH YOUR NAUSEA MEDICATION  *UNUSUAL SHORTNESS OF BREATH  *UNUSUAL BRUISING OR BLEEDING  TENDERNESS IN MOUTH AND THROAT WITH OR WITHOUT PRESENCE OF ULCERS  *URINARY PROBLEMS  *BOWEL PROBLEMS  UNUSUAL RASH Items with * indicate a potential emergency and should be followed up as soon as possible.  Feel free to call the clinic you have any questions or concerns. The clinic phone number is [phone].    

## 2013-02-25 NOTE — Patient Instructions (Signed)
CURRENT THERAPY: Systemic chemotherapy again with carboplatin for AUC of 5 on day 1 and etoposide 100 mg/M2 on days 1, 2 and 3 with Neulasta support on day 4. First dose given on 01/14/2013. Status post 2 cycles.  CHEMOTHERAPY INTENT: Palliative  CURRENT # OF CHEMOTHERAPY CYCLES: 2  CURRENT ANTIEMETICS: Zofran, dexamethasone and Compazine  CURRENT SMOKING STATUS: Former smoker  ORAL CHEMOTHERAPY AND CONSENT: None  CURRENT BISPHOSPHONATES USE: None  PAIN MANAGEMENT: 0/10  NARCOTICS INDUCED CONSTIPATION: None  LIVING WILL AND CODE STATUS: ?

## 2013-02-25 NOTE — Progress Notes (Signed)
[facility] [phone]   [phone]  OFFICE PROGRESS NOTE  Barker, Keith Ammons, MD [address] [place] [address]  PRINCIPAL DIAGNOSIS: Recurrent small cell lung cancer initially diagnosed as Limited-stage small cell lung cancer diagnosed in April of 2012.   PRIOR THERAPY:  1. Status post systemic chemotherapy with carboplatin and etoposide, last dose was given 09/30/2010. This was concurrent with radiotherapy under the care of Dr. [name]. 2. Status post prophylactic cranial irradiation completed on November 29, 2010.  CURRENT THERAPY: Systemic chemotherapy again with carboplatin for AUC of 5 on day 1 and etoposide 100 mg/M2 on days 1, 2 and 3 with Neulasta support on day 4. First  dose given on 01/14/2013. Status post 2 cycles.  CHEMOTHERAPY INTENT: Palliative  CURRENT # OF CHEMOTHERAPY CYCLES: 2 CURRENT ANTIEMETICS: Zofran, dexamethasone and Compazine  CURRENT SMOKING STATUS: Former smoker  ORAL CHEMOTHERAPY AND CONSENT: None  CURRENT BISPHOSPHONATES USE: None  PAIN MANAGEMENT: 0/10  NARCOTICS INDUCED CONSTIPATION: None  LIVING WILL AND CODE STATUS: ?   INTERVAL HISTORY: [name] 79 y.o. male returns to the clinic today for follow up visit accompanied by his wife and stepson and his wife. The patient tolerated the first 2 cycles of his treatment fairly well except for the fatigue secondary to chemotherapy-induced anemia. He received 2 units of PRBCs transfusion recently. The patient denied having any significant fever or chills. He denied having any nausea or vomiting. He has no chest pain but continues to have shortness breath with exertion was no cough or hemoptysis. The patient denied having any significant weight loss or night sweats. He had repeat CT scan of the chest, abdomen and pelvis performed recently and he is here for evaluation and discussion of his scan results.  MEDICAL HISTORY: Past Medical History    Diagnosis Date  . Hypertension   . Heart attack 1991    Inferior- wall MI  treated with emergency PTCA with RCA by Dr. Allyson Sabal, with a perfusion time of 1 hour 55 minutes.  . Emphysema   . Hyperlipidemia   . Allergic rhinitis   . Heart failure   . Cholelithiasis   . Cardiac pacemaker     Dule-chamber permanent pacemaker - [employer] DR RF device model # [id number], serial # [id number].  Marland Kitchen COPD (chronic obstructive pulmonary disease)     Longstanding COPD. Uses oxygen - 4 L per minute.  . SOB (shortness of breath)     Transthoracic Echo 04/24/12 EF = 55-60%. Showed sinus rhythm with left atrial abnormality, left axis deviation, and almost an atypical pulmonary disease pattern.   . Paroxysmal a-fib     Permanent Transvenous pacemaker insertion by Dr. Lynnea Ferrier in 2012 and is followed by Dr. Royann Shivers.  . Coronary artery disease     Coronary bypass graft in 2000 with a LIMA to his LAD, vein to obtuse marginal branch #1 and to the PDA. Myoview stress test 03/10/09 was nonischemic. Myocardial perfusion Imaging -04/24/12 - Low risk stress nuclear study. No evidence of ischemia, however the fixed perfusion defect is more prominent.   . Carotid artery disease     Had a left carotid endarterectomy with Dopplers performed in 03/2011 that showed a widely patent left endarterectomy site with no significant disease on the right.  . Oxygen dependent     Longstanding COPD - wear 4 L per minute  . Diabetes     non-insulin-requiring diabetes  .  Duodenitis 01/30/11  . Cancer     Small cell lung cancer. Received chemotherapy and radiation therapy.     ALLERGIES:  has No Known Allergies.  MEDICATIONS:  Current Outpatient Prescriptions  Medication Sig Dispense Refill  . albuterol (PROVENTIL HFA;VENTOLIN HFA) 108 (90 BASE) MCG/ACT inhaler Inhale 2 puffs into the lungs every 6 (six) hours as needed for wheezing.      Marland Kitchen aspirin EC 325 MG tablet Take 325 mg by mouth daily.       . calcium acetate (PHOSLO) 667 MG  capsule Take 667 mg by mouth.      . Chlorphen-Pseudoephed-APAP (CORICIDIN D PO) Take 2 tablets by mouth daily as needed.       . clopidogrel (PLAVIX) 75 MG tablet Take 1 tablet by mouth daily.      . DiphenhydrAMINE HCl (BENADRYL ALLERGY PO) Take 1 tablet by mouth as needed.      . feeding supplement (GLUCERNA SHAKE) LIQD Take 237 mLs by mouth 2 (two) times daily before a meal.        . Fluticasone-Salmeterol (ADVAIR DISKUS) 100-50 MCG/DOSE AEPB Inhale 1 puff into the lungs 2 (two) times daily.  60 each  5  . isosorbide mononitrate (IMDUR) 30 MG 24 hr tablet Take 1 tablet by mouth every morning.       . lidocaine-prilocaine (EMLA) cream Apply topically as needed. Apply to port 1 hr before chemotherapy  30 g  0  . loratadine (CLARITIN) 10 MG tablet Take 1 tablet (10 mg total) by mouth daily.  30 tablet  4  . losartan-hydrochlorothiazide (HYZAAR) 50-12.5 MG per tablet Take 1 tablet by mouth daily.  90 tablet  3  . metoprolol tartrate (LOPRESSOR) 25 MG tablet Take 12.5 mg by mouth 2 (two) times daily. 12.5mg  BID      . Multiple Vitamin (MULTIVITAMIN) tablet Take 1 tablet by mouth daily.        . OXYGEN-HELIUM IN Inhale 6 L into the lungs continuous.      . pantoprazole (PROTONIX) 40 MG tablet Take 40 mg by mouth daily.      Marland Kitchen Phenyleph-CPM-DM-Aspirin (ALKA-SELTZER PLUS COLD & COUGH PO) Take 2 tablets by mouth daily.      . simvastatin (ZOCOR) 20 MG tablet Take 20 mg by mouth at bedtime.        Marland Kitchen tiotropium (SPIRIVA HANDIHALER) 18 MCG inhalation capsule Place 1 capsule (18 mcg total) into inhaler and inhale daily.  30 capsule  5  . UNABLE TO FIND Med Name: prostate supplement      . acetaminophen (TYLENOL) 325 MG tablet Take 650 mg by mouth every 6 (six) hours as needed.        . nitroGLYCERIN (NITROSTAT) 0.4 MG SL tablet Place 0.4 mg under the tongue every 5 (five) minutes as needed.        . prochlorperazine (COMPAZINE) 10 MG tablet Take 1 tablet (10 mg total) by mouth every 6 (six) hours as  needed.  30 tablet  1   No current facility-administered medications for this visit.    SURGICAL HISTORY:  Past Surgical History  Procedure Laterality Date  . Pacemaker insertion  2012    For prolonged sinus pause.  Tod Persia placement4/18/12 tip svc    . Carotid endarterectomy  03/2011    With Dopplers performed most recently in 03/2011 that showed a widely patent left endarterectomy site with no significant disease on the right.  . Coronary artery bypass graft  2000  CAD - CABG with a LIMA to his LAD, a vein to the obtuse marginal branch #1 and to the PDA  . Cardiac catheterization  06/2010    Unstable angina - Catheterized by Dr. Tresa Endo was noted to have essentially total occlusion of the native coronary arteries, a patent LIMA to the LAD & revealing a 70% stenosis at the origin of his circumflex and obtuse marginal graft, an 80% PLA lesion beyond graft insertion. Stent in distal right coronary artery. Predilated with 2.5 x 20 TREK balloon. Stent-Vision 2.75 x 23 balloon.  . Coronary angioplasty Right 06/2010    Unstable angina - Predilated with a 2.5 x 20 TREK balloon. Distal right coronary artery via  the saphenous vein graft with placement of a bare metal stent (a Vision 2.75 x 23 mm by Dr. Clarene Duke)    REVIEW OF SYSTEMS:  Constitutional: positive for fatigue Eyes: negative Ears, nose, mouth, throat, and face: negative Respiratory: negative Cardiovascular: negative Gastrointestinal: negative Genitourinary:negative Integument/breast: negative Hematologic/lymphatic: negative Musculoskeletal:negative Neurological: negative Behavioral/Psych: negative Endocrine: negative Allergic/Immunologic: negative   PHYSICAL EXAMINATION: General appearance: alert, cooperative, fatigued and no distress Head: Normocephalic, without obvious abnormality, atraumatic Neck: no adenopathy, no JVD, supple, symmetrical, trachea midline and thyroid not enlarged, symmetric, no  tenderness/mass/nodules Lymph nodes: Cervical, supraclavicular, and axillary nodes normal. Resp: clear to auscultation bilaterally Back: symmetric, no curvature. ROM normal. No CVA tenderness. Cardio: regular rate and rhythm, S1, S2 normal, no murmur, click, rub or gallop GI: soft, non-tender; bowel sounds normal; no masses,  no organomegaly Extremities: extremities normal, atraumatic, no cyanosis or edema Neurologic: Alert and oriented X 3, normal strength and tone. Normal symmetric reflexes. Normal coordination and gait  ECOG PERFORMANCE STATUS: 2 - Symptomatic, <50% confined to bed  Blood pressure 120/61, pulse 76, temperature 96.8 F (36 C), temperature source Oral, resp. rate 20, height 5\' 8"  (1.727 m), weight 158 lb 14.4 oz (72.077 kg).  LABORATORY DATA: Lab Results  Component Value Date   WBC 20.4* 02/25/2013   HGB 11.4* 02/25/2013   HCT 35.7* 02/25/2013   MCV 87.4 02/25/2013   PLT 114* 02/25/2013      Chemistry      Component Value Date/Time   NA 141 02/18/2013 0936   NA 138 11/28/2012 1152   NA 137 11/21/2011 0945   K 4.4 02/18/2013 0936   K 4.4 11/28/2012 1152   K 4.5 11/21/2011 0945   CL 99 11/28/2012 1152   CL 100 06/21/2012 0912   CL 96* 11/21/2011 0945   CO2 31* 02/18/2013 0936   CO2 32 11/28/2012 1152   CO2 31 11/21/2011 0945   BUN 13.8 02/18/2013 0936   BUN 20 11/28/2012 1152   BUN 18 11/21/2011 0945   CREATININE 0.9 02/18/2013 0936   CREATININE 1.08 11/28/2012 1152   CREATININE 0.89 02/22/2011 1411      Component Value Date/Time   CALCIUM 9.5 02/18/2013 0936   CALCIUM 9.3 11/28/2012 1152   CALCIUM 9.4 11/21/2011 0945   CALCIUM 7.6* 09/18/2010 1136   ALKPHOS 109 02/18/2013 0936   ALKPHOS 50 11/21/2011 0945   ALKPHOS 70 02/22/2011 1411   AST 22 02/18/2013 0936   AST 24 11/21/2011 0945   AST 13 02/22/2011 1411   ALT 16 02/18/2013 0936   ALT 26 11/21/2011 0945   ALT 9 02/22/2011 1411   BILITOT 0.33 02/18/2013 0936   BILITOT 1.10 11/21/2011 0945   BILITOT 0.5  02/22/2011 1411       RADIOGRAPHIC STUDIES: Ct Chest W  Contrast  02/21/2013   CLINICAL DATA:  Lung cancer followup  EXAM: CT CHEST, ABDOMEN, AND PELVIS WITH CONTRAST  TECHNIQUE: Multidetector CT imaging of the chest, abdomen and pelvis was performed following the standard protocol during bolus administration of intravenous contrast.  CONTRAST:  OMNIPAQUE IOHEXOL 300 MG/ML  SOLN  COMPARISON:  12/24/2012  FINDINGS: CT CHEST FINDINGS  No pleural effusion identified. There is advanced bolus emphysema identified. Scarring in volume loss within the left upper lobe is again noted. Changes of external beam radiation are noted within the paramediastinal left upper lobe including bronchiectasis, consolidation and fibrosis. Within the superior segment of the left lower lobe is a spiculated nodule measuring 1.5 cm, image 16/series 6. This is unchanged from previous exam. No new pulmonary parenchymal nodule or mass identified. Next  The heart size is normal. No pericardial effusion identified. No mediastinal or hilar adenopathy identified. Previous median sternotomy and CABG procedure. There is no axillary or supraclavicular adenopathy.  Review of the visualized osseous structures is negative for aggressive lytic or sclerotic bone lesion.  CT ABDOMEN AND PELVIS FINDINGS  Again noted are multi focal liver lesions. Index lesion within the dome of liver measures 3.6 cm, image 55/series 2. Previously 4.2 cm. Index lesion within the left hepatic lobe measures 2.1 cm, image 62/ series 2. Previously 3.2 cm. Within the inferior right hepatic lobe there is a 2.2 cm lesion, image 66/ series 2. Previously 2.9 cm. Larina Bras is identified within the gallbladder. There is no biliary dilatation. Normal appearance of the pancreas. The spleen is unremarkable.  Normal appearance of the right adrenal gland. Asymmetric enlargement of the left adrenal gland is similar to previous exam. The left kidney is unremarkable. Atrophy of the lower  pole of the right kidney is noted. There is a cyst within the upper pole the right kidney. The urinary bladder appears normal. There is marked enlargement of the prostate gland which measures 6.3 cm.  There is calcified atherosclerotic disease involving the abdominal aorta. No aneurysm. No upper abdominal or pelvic adenopathy identified.  The stomach is normal. The small bowel loops have a normal course and caliber without obstruction. The appendix is visualized and appears normal. Proximal colon is normal. Scattered distal colonic diverticula identified without acute inflammation.  There is no free fluid or abnormal fluid collections identified within the abdomen or pelvis.  Review of the visualized bony structures is significant for multi level lumbar degenerative disc disease. Bilateral L5 pars defects are noted. There is an anterolisthesis of L5 on S1 noted.  IMPRESSION: 1. Stable appearance of the chest. Spiculated nodule in the superior segment of left lower lobe is unchanged in size from previous exam. 2. Interval decrease in size of multi focal liver metastases. 3. No new or progressive disease identified. 4.   Electronically Signed   By: Signa Kell M.D.   On: 02/21/2013 16:36   Ir Cv Line Injection  02/05/2013   CLINICAL DATA:  79 year old with recurrent small cell lung cancer and currently receiving chemotherapy. The patient has a left subclavian Port-A-Cath which is not aspirating. Cath flo was recently placed within the catheter.  EXAM: PORT-A-CATH INJECTION WITH FLUOROSCOPY  Physician: Rachelle Hora. Henn, MD  MEDICATIONS: None  ANESTHESIA/SEDATION: Moderate sedation time: None  CONTRAST:  CONTRAST 15 mL Omnipaque 300  FLUOROSCOPY TIME:  6 seconds  PROCEDURE: The patient was placed supine on the interventional table. The left chest port was already accessed. Omnipaque was injected through the catheter with fluoroscopy.  COMPLICATIONS:  None  FINDINGS: There is a left subclavian Port-A-Cath with the tip  in the upper SVC. The port catheter is patent without discontinuity. There is a small amount of contrast pooling around the tip of the catheter during the catheter injection. Findings are compatible with a fibrin sheath. The catheter tip is likely adherent to the vessel wall. The lower SVC and right atrium are patent. Patient also has a right-sided dual lead cardiac pacemaker.  IMPRESSION: The left subclavian Port-A-Cath is positioned in the upper SVC. The tip of the catheter is likely adherent to the vein wall with a fibrin sheath.  The findings were discussed withAdrena Johnson. The port is appropriately positioned for immediate use but recommend a port revision or TPA infusion in the future.   Electronically Signed   By: Richarda Overlie M.D.   On: 02/05/2013 14:05    ASSESSMENT AND PLAN:  This is a very pleasant 79 years old white male recently diagnosed with recurrent small cell lung cancer with multiple liver metastases and he is undergoing systemic chemotherapy with carboplatin and etoposide status post 2 cycles. The patient is feeling fine today except for the fatigue secondary to chemotherapy-induced anemia. His recent CT scan of the chest, abdomen and pelvis showed significant improvement in his disease especially in the liver. I discussed the scan results and showed the images to the patient and his family. I recommended for him to continue her systemic chemotherapy with carboplatin and etoposide with the same doses. I would see him back for followup visit in 3 weeks with the next cycle of his chemotherapy. He was advised to call immediately if he has any concerning symptoms in the interval  The patient voices understanding of current disease status and treatment options and is in agreement with the current care plan.  All questions were answered. The patient knows to call the clinic with any problems, questions or concerns. We can certainly see the patient much sooner if necessary.  I spent 15  minutes counseling the patient face to face. The total time spent in the appointment was 25 minutes.

## 2013-02-26 ENCOUNTER — Ambulatory Visit (HOSPITAL_BASED_OUTPATIENT_CLINIC_OR_DEPARTMENT_OTHER): Payer: Medicare Other

## 2013-02-26 VITALS — BP 102/56 | HR 71 | Temp 98.1°F

## 2013-02-26 DIAGNOSIS — C3410 Malignant neoplasm of upper lobe, unspecified bronchus or lung: Secondary | ICD-10-CM

## 2013-02-26 DIAGNOSIS — C787 Secondary malignant neoplasm of liver and intrahepatic bile duct: Secondary | ICD-10-CM

## 2013-02-26 DIAGNOSIS — C3491 Malignant neoplasm of unspecified part of right bronchus or lung: Secondary | ICD-10-CM

## 2013-02-26 DIAGNOSIS — Z5111 Encounter for antineoplastic chemotherapy: Secondary | ICD-10-CM

## 2013-02-26 MED ORDER — SODIUM CHLORIDE 0.9 % IJ SOLN
10.0000 mL | INTRAMUSCULAR | Status: DC | PRN
Start: 2013-02-26 — End: 2013-02-26
  Administered 2013-02-26: 10 mL
  Filled 2013-02-26: qty 10

## 2013-02-26 MED ORDER — SODIUM CHLORIDE 0.9 % IV SOLN
Freq: Once | INTRAVENOUS | Status: AC
  Administered 2013-02-26: 13:00:00 via INTRAVENOUS

## 2013-02-26 MED ORDER — HEPARIN SOD (PORK) LOCK FLUSH 100 UNIT/ML IV SOLN
500.0000 [IU] | Freq: Once | INTRAVENOUS | Status: AC | PRN
  Administered 2013-02-26: 500 [IU]
  Filled 2013-02-26: qty 5

## 2013-02-26 MED ORDER — PROCHLORPERAZINE MALEATE 10 MG PO TABS
ORAL_TABLET | ORAL | Status: AC
  Filled 2013-02-26: qty 1

## 2013-02-26 MED ORDER — SODIUM CHLORIDE 0.9 % IV SOLN
100.0000 mg/m2 | Freq: Once | INTRAVENOUS | Status: AC
  Administered 2013-02-26: 180 mg via INTRAVENOUS
  Filled 2013-02-26: qty 9

## 2013-02-26 MED ORDER — PROCHLORPERAZINE MALEATE 10 MG PO TABS
10.0000 mg | ORAL_TABLET | Freq: Once | ORAL | Status: AC
  Administered 2013-02-26: 10 mg via ORAL

## 2013-02-26 NOTE — Patient Instructions (Signed)
[facility] Discharge Instructions for Patients Receiving Chemotherapy  Today you received the following chemotherapy agents VP-16  To help prevent nausea and vomiting after your treatment, we encourage you to take your nausea medication as prescribed.   If you develop nausea and vomiting that is not controlled by your nausea medication, call the clinic.   BELOW ARE SYMPTOMS THAT SHOULD BE REPORTED IMMEDIATELY:  *FEVER GREATER THAN 100.5 F  *CHILLS WITH OR WITHOUT FEVER  NAUSEA AND VOMITING THAT IS NOT CONTROLLED WITH YOUR NAUSEA MEDICATION  *UNUSUAL SHORTNESS OF BREATH  *UNUSUAL BRUISING OR BLEEDING  TENDERNESS IN MOUTH AND THROAT WITH OR WITHOUT PRESENCE OF ULCERS  *URINARY PROBLEMS  *BOWEL PROBLEMS  UNUSUAL RASH Items with * indicate a potential emergency and should be followed up as soon as possible.  Feel free to call the clinic you have any questions or concerns. The clinic phone number is [phone].    

## 2013-02-27 ENCOUNTER — Ambulatory Visit (HOSPITAL_BASED_OUTPATIENT_CLINIC_OR_DEPARTMENT_OTHER): Payer: Medicare Other

## 2013-02-27 ENCOUNTER — Encounter: Payer: Self-pay | Admitting: *Deleted

## 2013-02-27 VITALS — BP 120/80 | HR 80 | Temp 98.0°F | Resp 20

## 2013-02-27 DIAGNOSIS — C3410 Malignant neoplasm of upper lobe, unspecified bronchus or lung: Secondary | ICD-10-CM

## 2013-02-27 DIAGNOSIS — Z5111 Encounter for antineoplastic chemotherapy: Secondary | ICD-10-CM

## 2013-02-27 DIAGNOSIS — C787 Secondary malignant neoplasm of liver and intrahepatic bile duct: Secondary | ICD-10-CM

## 2013-02-27 DIAGNOSIS — C3491 Malignant neoplasm of unspecified part of right bronchus or lung: Secondary | ICD-10-CM

## 2013-02-27 MED ORDER — SODIUM CHLORIDE 0.9 % IV SOLN
100.0000 mg/m2 | Freq: Once | INTRAVENOUS | Status: AC
  Administered 2013-02-27: 180 mg via INTRAVENOUS
  Filled 2013-02-27: qty 9

## 2013-02-27 MED ORDER — PROCHLORPERAZINE MALEATE 10 MG PO TABS
10.0000 mg | ORAL_TABLET | Freq: Once | ORAL | Status: AC
  Administered 2013-02-27: 10 mg via ORAL

## 2013-02-27 MED ORDER — HEPARIN SOD (PORK) LOCK FLUSH 100 UNIT/ML IV SOLN
250.0000 [IU] | Freq: Once | INTRAVENOUS | Status: AC | PRN
  Administered 2013-02-27: 250 [IU]
  Filled 2013-02-27: qty 5

## 2013-02-27 MED ORDER — SODIUM CHLORIDE 0.9 % IV SOLN
Freq: Once | INTRAVENOUS | Status: AC
  Administered 2013-02-27: 14:00:00 via INTRAVENOUS

## 2013-02-27 MED ORDER — PROCHLORPERAZINE MALEATE 10 MG PO TABS
ORAL_TABLET | ORAL | Status: AC
  Filled 2013-02-27: qty 1

## 2013-02-27 MED ORDER — HEPARIN SOD (PORK) LOCK FLUSH 100 UNIT/ML IV SOLN
500.0000 [IU] | Freq: Once | INTRAVENOUS | Status: AC | PRN
  Administered 2013-02-27: 500 [IU]
  Filled 2013-02-27: qty 5

## 2013-02-27 MED ORDER — SODIUM CHLORIDE 0.9 % IJ SOLN
10.0000 mL | INTRAMUSCULAR | Status: DC | PRN
Start: 2013-02-27 — End: 2013-02-27
  Administered 2013-02-27: 10 mL
  Filled 2013-02-27: qty 10

## 2013-02-27 NOTE — Patient Instructions (Signed)
[facility] Discharge Instructions for Patients Receiving Chemotherapy  Today you received the following chemotherapy agents: Etoposide  To help prevent nausea and vomiting after your treatment, we encourage you to take your nausea medication as scheduled.   If you develop nausea and vomiting that is not controlled by your nausea medication, call the clinic.   BELOW ARE SYMPTOMS THAT SHOULD BE REPORTED IMMEDIATELY:  *FEVER GREATER THAN 100.5 F  *CHILLS WITH OR WITHOUT FEVER  NAUSEA AND VOMITING THAT IS NOT CONTROLLED WITH YOUR NAUSEA MEDICATION  *UNUSUAL SHORTNESS OF BREATH  *UNUSUAL BRUISING OR BLEEDING  TENDERNESS IN MOUTH AND THROAT WITH OR WITHOUT PRESENCE OF ULCERS  *URINARY PROBLEMS  *BOWEL PROBLEMS  UNUSUAL RASH Items with * indicate a potential emergency and should be followed up as soon as possible.  Feel free to call the clinic you have any questions or concerns. The clinic phone number is [phone].

## 2013-02-28 ENCOUNTER — Ambulatory Visit (HOSPITAL_BASED_OUTPATIENT_CLINIC_OR_DEPARTMENT_OTHER): Payer: Medicare Other

## 2013-02-28 VITALS — BP 117/68 | HR 72 | Temp 97.0°F

## 2013-02-28 DIAGNOSIS — C787 Secondary malignant neoplasm of liver and intrahepatic bile duct: Secondary | ICD-10-CM

## 2013-02-28 DIAGNOSIS — C3491 Malignant neoplasm of unspecified part of right bronchus or lung: Secondary | ICD-10-CM

## 2013-02-28 DIAGNOSIS — C3410 Malignant neoplasm of upper lobe, unspecified bronchus or lung: Secondary | ICD-10-CM

## 2013-02-28 MED ORDER — PEGFILGRASTIM INJECTION 6 MG/0.6ML
6.0000 mg | Freq: Once | SUBCUTANEOUS | Status: AC
  Administered 2013-02-28: 6 mg via SUBCUTANEOUS
  Filled 2013-02-28: qty 0.6

## 2013-02-28 NOTE — Patient Instructions (Signed)

## 2013-03-04 ENCOUNTER — Other Ambulatory Visit (HOSPITAL_BASED_OUTPATIENT_CLINIC_OR_DEPARTMENT_OTHER): Payer: Medicare Other | Admitting: Lab

## 2013-03-04 DIAGNOSIS — C3491 Malignant neoplasm of unspecified part of right bronchus or lung: Secondary | ICD-10-CM

## 2013-03-04 DIAGNOSIS — C3490 Malignant neoplasm of unspecified part of unspecified bronchus or lung: Secondary | ICD-10-CM

## 2013-03-04 LAB — CBC WITH DIFFERENTIAL/PLATELET
Basophils Absolute: 0.1 10*3/uL (ref 0.0–0.1)
Eosinophils Absolute: 0 10*3/uL (ref 0.0–0.5)
HCT: 29.6 % — ABNORMAL LOW (ref 38.4–49.9)
HGB: 9.6 g/dL — ABNORMAL LOW (ref 13.0–17.1)
LYMPH%: 3 % — ABNORMAL LOW (ref 14.0–49.0)
MCHC: 32.5 g/dL (ref 32.0–36.0)
MONO#: 0.4 10*3/uL (ref 0.1–0.9)
NEUT#: 14.4 10*3/uL — ABNORMAL HIGH (ref 1.5–6.5)
NEUT%: 93.7 % — ABNORMAL HIGH (ref 39.0–75.0)
Platelets: 94 10*3/uL — ABNORMAL LOW (ref 140–400)
RBC: 3.35 10*6/uL — ABNORMAL LOW (ref 4.20–5.82)
RDW: 17.6 % — ABNORMAL HIGH (ref 11.0–14.6)
WBC: 15.4 10*3/uL — ABNORMAL HIGH (ref 4.0–10.3)
lymph#: 0.5 10*3/uL — ABNORMAL LOW (ref 0.9–3.3)

## 2013-03-04 LAB — COMPREHENSIVE METABOLIC PANEL (CC13)
AST: 18 U/L (ref 5–34)
Anion Gap: 9 mEq/L (ref 3–11)
CO2: 33 mEq/L — ABNORMAL HIGH (ref 22–29)
Chloride: 100 mEq/L (ref 98–109)
Glucose: 101 mg/dl (ref 70–140)
Sodium: 141 mEq/L (ref 136–145)
Total Bilirubin: 0.95 mg/dL (ref 0.20–1.20)
Total Protein: 6.7 g/dL (ref 6.4–8.3)

## 2013-03-11 ENCOUNTER — Other Ambulatory Visit (HOSPITAL_BASED_OUTPATIENT_CLINIC_OR_DEPARTMENT_OTHER): Payer: Medicare Other | Admitting: Lab

## 2013-03-11 DIAGNOSIS — C3490 Malignant neoplasm of unspecified part of unspecified bronchus or lung: Secondary | ICD-10-CM

## 2013-03-11 DIAGNOSIS — C3491 Malignant neoplasm of unspecified part of right bronchus or lung: Secondary | ICD-10-CM

## 2013-03-11 LAB — COMPREHENSIVE METABOLIC PANEL (CC13)
ALT: 15 U/L (ref 0–55)
Alkaline Phosphatase: 107 U/L (ref 40–150)
Anion Gap: 11 mEq/L (ref 3–11)
CO2: 32 mEq/L — ABNORMAL HIGH (ref 22–29)
Calcium: 9.5 mg/dL (ref 8.4–10.4)
Chloride: 97 mEq/L — ABNORMAL LOW (ref 98–109)
Potassium: 4 mEq/L (ref 3.5–5.1)
Sodium: 140 mEq/L (ref 136–145)
Total Protein: 6.8 g/dL (ref 6.4–8.3)

## 2013-03-11 LAB — CBC WITH DIFFERENTIAL/PLATELET
BASO%: 1.4 % (ref 0.0–2.0)
Basophils Absolute: 0.2 10*3/uL — ABNORMAL HIGH (ref 0.0–0.1)
Eosinophils Absolute: 0.2 10*3/uL (ref 0.0–0.5)
HCT: 28.5 % — ABNORMAL LOW (ref 38.4–49.9)
LYMPH%: 6.1 % — ABNORMAL LOW (ref 14.0–49.0)
MCHC: 32.4 g/dL (ref 32.0–36.0)
MONO#: 0.9 10*3/uL (ref 0.1–0.9)
NEUT#: 10.2 10*3/uL — ABNORMAL HIGH (ref 1.5–6.5)
NEUT%: 83.7 % — ABNORMAL HIGH (ref 39.0–75.0)
Platelets: 33 10*3/uL — ABNORMAL LOW (ref 140–400)
RBC: 3.15 10*6/uL — ABNORMAL LOW (ref 4.20–5.82)
WBC: 12.2 10*3/uL — ABNORMAL HIGH (ref 4.0–10.3)
lymph#: 0.7 10*3/uL — ABNORMAL LOW (ref 0.9–3.3)

## 2013-03-15 ENCOUNTER — Other Ambulatory Visit: Payer: Self-pay | Admitting: Emergency Medicine

## 2013-03-18 ENCOUNTER — Ambulatory Visit (HOSPITAL_BASED_OUTPATIENT_CLINIC_OR_DEPARTMENT_OTHER): Payer: Medicare Other

## 2013-03-18 ENCOUNTER — Encounter: Payer: Self-pay | Admitting: Physician Assistant

## 2013-03-18 ENCOUNTER — Telehealth: Payer: Self-pay | Admitting: Internal Medicine

## 2013-03-18 ENCOUNTER — Ambulatory Visit (HOSPITAL_BASED_OUTPATIENT_CLINIC_OR_DEPARTMENT_OTHER): Payer: Medicare Other | Admitting: Physician Assistant

## 2013-03-18 ENCOUNTER — Telehealth: Payer: Self-pay | Admitting: *Deleted

## 2013-03-18 ENCOUNTER — Other Ambulatory Visit (HOSPITAL_BASED_OUTPATIENT_CLINIC_OR_DEPARTMENT_OTHER): Payer: Medicare Other | Admitting: Lab

## 2013-03-18 VITALS — BP 105/55 | HR 82 | Temp 97.0°F | Resp 17 | Ht 68.0 in | Wt 159.8 lb

## 2013-03-18 DIAGNOSIS — C3491 Malignant neoplasm of unspecified part of right bronchus or lung: Secondary | ICD-10-CM

## 2013-03-18 DIAGNOSIS — C787 Secondary malignant neoplasm of liver and intrahepatic bile duct: Secondary | ICD-10-CM

## 2013-03-18 DIAGNOSIS — C3410 Malignant neoplasm of upper lobe, unspecified bronchus or lung: Secondary | ICD-10-CM

## 2013-03-18 DIAGNOSIS — C3490 Malignant neoplasm of unspecified part of unspecified bronchus or lung: Secondary | ICD-10-CM

## 2013-03-18 DIAGNOSIS — D6481 Anemia due to antineoplastic chemotherapy: Secondary | ICD-10-CM

## 2013-03-18 DIAGNOSIS — Z5111 Encounter for antineoplastic chemotherapy: Secondary | ICD-10-CM

## 2013-03-18 LAB — COMPREHENSIVE METABOLIC PANEL (CC13)
ALT: 16 U/L (ref 0–55)
Albumin: 3.9 g/dL (ref 3.5–5.0)
Alkaline Phosphatase: 85 U/L (ref 40–150)
Creatinine: 0.8 mg/dL (ref 0.7–1.3)
Glucose: 92 mg/dl (ref 70–140)
Sodium: 142 mEq/L (ref 136–145)
Total Bilirubin: 0.6 mg/dL (ref 0.20–1.20)
Total Protein: 6.5 g/dL (ref 6.4–8.3)

## 2013-03-18 LAB — CBC WITH DIFFERENTIAL/PLATELET
BASO%: 0.2 % (ref 0.0–2.0)
Basophils Absolute: 0 10*3/uL (ref 0.0–0.1)
EOS%: 1.6 % (ref 0.0–7.0)
LYMPH%: 5.6 % — ABNORMAL LOW (ref 14.0–49.0)
MCH: 29.4 pg (ref 27.2–33.4)
MCV: 94.3 fL (ref 79.3–98.0)
MONO%: 4.9 % (ref 0.0–14.0)
RBC: 2.96 10*6/uL — ABNORMAL LOW (ref 4.20–5.82)
RDW: 21.8 % — ABNORMAL HIGH (ref 11.0–14.6)
nRBC: 0 % (ref 0–0)

## 2013-03-18 MED ORDER — DEXAMETHASONE SODIUM PHOSPHATE 20 MG/5ML IJ SOLN
20.0000 mg | Freq: Once | INTRAMUSCULAR | Status: AC
  Administered 2013-03-18: 20 mg via INTRAVENOUS

## 2013-03-18 MED ORDER — SODIUM CHLORIDE 0.9 % IV SOLN
Freq: Once | INTRAVENOUS | Status: AC
  Administered 2013-03-18: 13:00:00 via INTRAVENOUS

## 2013-03-18 MED ORDER — CARBOPLATIN CHEMO INTRADERMAL TEST DOSE 100MCG/0.02ML
100.0000 ug | Freq: Once | INTRADERMAL | Status: AC
  Administered 2013-03-18: 100 ug via INTRADERMAL
  Filled 2013-03-18: qty 0.01

## 2013-03-18 MED ORDER — SODIUM CHLORIDE 0.9 % IV SOLN
90.0000 mg/m2 | Freq: Once | INTRAVENOUS | Status: AC
  Administered 2013-03-18: 170 mg via INTRAVENOUS
  Filled 2013-03-18: qty 8.5

## 2013-03-18 MED ORDER — SODIUM CHLORIDE 0.9 % IV SOLN
336.4000 mg | Freq: Once | INTRAVENOUS | Status: AC
  Administered 2013-03-18: 340 mg via INTRAVENOUS
  Filled 2013-03-18: qty 34

## 2013-03-18 MED ORDER — ONDANSETRON 16 MG/50ML IVPB (CHCC)
16.0000 mg | Freq: Once | INTRAVENOUS | Status: AC
  Administered 2013-03-18: 16 mg via INTRAVENOUS

## 2013-03-18 MED ORDER — HEPARIN SOD (PORK) LOCK FLUSH 100 UNIT/ML IV SOLN
500.0000 [IU] | Freq: Once | INTRAVENOUS | Status: AC | PRN
  Administered 2013-03-18: 500 [IU]
  Filled 2013-03-18: qty 5

## 2013-03-18 MED ORDER — SODIUM CHLORIDE 0.9 % IJ SOLN
10.0000 mL | INTRAMUSCULAR | Status: DC | PRN
  Administered 2013-03-18: 10 mL
  Filled 2013-03-18: qty 10

## 2013-03-18 MED ORDER — ONDANSETRON 16 MG/50ML IVPB (CHCC)
INTRAVENOUS | Status: AC
  Filled 2013-03-18: qty 16

## 2013-03-18 MED ORDER — DEXAMETHASONE SODIUM PHOSPHATE 20 MG/5ML IJ SOLN
INTRAMUSCULAR | Status: AC
  Filled 2013-03-18: qty 5

## 2013-03-18 NOTE — Telephone Encounter (Signed)
Per staff message and POF I have scheduled appts.  JMW  

## 2013-03-18 NOTE — Patient Instructions (Signed)
[facility] Discharge Instructions for Patients Receiving Chemotherapy  Today you received the following chemotherapy agents Carboplatin                   To help prevent nausea and vomiting after your treatment, we encourage you to take your nausea medication Compazine 10 mg as ordered by Dr. [name].   If you develop nausea and vomiting that is not controlled by your nausea medication, call the clinic.   BELOW ARE SYMPTOMS THAT SHOULD BE REPORTED IMMEDIATELY:  *FEVER GREATER THAN 100.5 F  *CHILLS WITH OR WITHOUT FEVER  NAUSEA AND VOMITING THAT IS NOT CONTROLLED WITH YOUR NAUSEA MEDICATION  *UNUSUAL SHORTNESS OF BREATH  *UNUSUAL BRUISING OR BLEEDING  TENDERNESS IN MOUTH AND THROAT WITH OR WITHOUT PRESENCE OF ULCERS  *URINARY PROBLEMS  *BOWEL PROBLEMS  UNUSUAL RASH Items with * indicate a potential emergency and should be followed up as soon as possible.  Feel free to call the clinic you have any questions or concerns. The clinic phone number is [phone].

## 2013-03-18 NOTE — Progress Notes (Signed)
Pharmacist verified treatment with provider.  Verbal order received and read back that it is okay to proceed with treatment today despite counts.  Wife asked for lab results indicating further treatments are decisive amongst today's results.  Further discussion is need for blood transfusion next week.  Verbal orders received and read back from [facility] and Dr. [name] that labs will be checked next week and transfusion if hgb < 8.  Patient and wife notified of these orders.  Tomorrow will be for chemotherapy treatment only.  Reviewed with to go to ED if s.o.b, chest pain or dizziness/light headed.  Report stage IV COPD and currently respiratory challenged due to this diagnosis.  Asked that if changes from the normal status occur to go to ED.  Today color is good and has rested well with no respiratory distress on 4L nasal cannula.  Discussed protocol to access port-a-caths and peripheral IV's new with each chemotherapy treatment with patient.  Today remains accessed per request.  No problems with blood return from port-a-cath today.

## 2013-03-18 NOTE — Telephone Encounter (Signed)
appts made per 12/9 POF AVS and CAL printed Email to MW to add rx shh

## 2013-03-18 NOTE — Patient Instructions (Signed)
Continue weekly labs as scheduled Followup with Dr. [name] in 3 weeks with repeat CT scan of the chest, abdomen and pelvis to reevaluate your disease

## 2013-03-18 NOTE — Progress Notes (Signed)
DISCHARGED VIA WHEELCHAIR, WITH WIFE IN NO DISTRESS.

## 2013-03-18 NOTE — Progress Notes (Addendum)
[facility] [phone]   [phone]  SHARED VISIT PROGRESS NOTE  ROBERTS, [name], MD [address] [place] [address]  PRINCIPAL DIAGNOSIS: Recurrent small cell lung cancer initially diagnosed as Limited-stage small cell lung cancer diagnosed in April of 2012.   PRIOR THERAPY:  1. Status post systemic chemotherapy with carboplatin and etoposide, last dose was given 09/30/2010. This was concurrent with radiotherapy under the care of Dr. [name]. 2. Status post prophylactic cranial irradiation completed on November 29, 2010.  CURRENT THERAPY: Systemic chemotherapy again with carboplatin for AUC of 5 on day 1 and etoposide 100 mg/M2 on days 1, 2 and 3 with Neulasta support on day 4. First  dose given on 01/14/2013. Status post 3 cycles.  CHEMOTHERAPY INTENT: Palliative  CURRENT # OF CHEMOTHERAPY CYCLES: 4 CURRENT ANTIEMETICS: Zofran, dexamethasone and Compazine  CURRENT SMOKING STATUS: Former smoker  ORAL CHEMOTHERAPY AND CONSENT: None  CURRENT BISPHOSPHONATES USE: None  PAIN MANAGEMENT: 0/10  NARCOTICS INDUCED CONSTIPATION: None  LIVING WILL AND CODE STATUS: ?   INTERVAL HISTORY: [name] 79 y.o. male returns to the clinic today for follow up visit accompanied by his wife.  The patient tolerated the first 3 cycles of his treatment fairly well except for the fatigue secondarychemotherapy-induced anemia. He received 2 units of PRBCs transfusion to address the chemotherapy induced anemia. The patient denied having any significant fever or chills. He denied having any nausea or vomiting. He has no chest pain but continues to have shortness breath with exertion was no cough or hemoptysis. The patient denied having any significant weight loss or night sweats.    MEDICAL HISTORY: Past Medical History  Diagnosis Date  . Hypertension   . Heart attack 1991    Inferior- wall MI  treated with emergency PTCA with RCA by Dr.  Allyson Sabal, with a perfusion time of 1 hour 55 minutes.  . Emphysema   . Hyperlipidemia   . Allergic rhinitis   . Heart failure   . Cholelithiasis   . Cardiac pacemaker     Dule-chamber permanent pacemaker - [employer] RF device model # [id number], serial # [id number].  Marland Kitchen COPD (chronic obstructive pulmonary disease)     Longstanding COPD. Uses oxygen - 4 L per minute.  . SOB (shortness of breath)     Transthoracic Echo 04/24/12 EF = 55-60%. Showed sinus rhythm with left atrial abnormality, left axis deviation, and almost an atypical pulmonary disease pattern.   . Paroxysmal a-fib     Permanent Transvenous pacemaker insertion by Dr. Lynnea Ferrier in 2012 and is followed by Dr. Royann Shivers.  . Coronary artery disease     Coronary bypass graft in 2000 with a LIMA to his LAD, vein to obtuse marginal branch #1 and to the PDA. Myoview stress test 03/10/09 was nonischemic. Myocardial perfusion Imaging -04/24/12 - Low risk stress nuclear study. No evidence of ischemia, however the fixed perfusion defect is more prominent.   . Carotid artery disease     Had a left carotid endarterectomy with Dopplers performed in 03/2011 that showed a widely patent left endarterectomy site with no significant disease on the right.  . Oxygen dependent     Longstanding COPD - wear 4 L per minute  . Diabetes     non-insulin-requiring diabetes  . Duodenitis 01/30/11  . Cancer     Small cell lung cancer. Received chemotherapy and radiation therapy.     ALLERGIES:  has No Known Allergies.  MEDICATIONS:  Current Outpatient Prescriptions  Medication Sig Dispense Refill  . acetaminophen (TYLENOL) 325 MG tablet Take 650 mg by mouth every 6 (six) hours as needed.        Marland Kitchen albuterol (PROVENTIL HFA;VENTOLIN HFA) 108 (90 BASE) MCG/ACT inhaler Inhale 2 puffs into the lungs every 6 (six) hours as needed for wheezing.      Marland Kitchen aspirin EC 325 MG tablet Take 325 mg by mouth daily.       . calcium acetate (PHOSLO) 667 MG capsule Take 667 mg by  mouth.      . Chlorphen-Pseudoephed-APAP (CORICIDIN D PO) Take 2 tablets by mouth daily as needed.       . clopidogrel (PLAVIX) 75 MG tablet Take 1 tablet by mouth daily.      . DiphenhydrAMINE HCl (BENADRYL ALLERGY PO) Take 1 tablet by mouth as needed.      . feeding supplement (GLUCERNA SHAKE) LIQD Take 237 mLs by mouth 2 (two) times daily before a meal.        . Fluticasone-Salmeterol (ADVAIR DISKUS) 100-50 MCG/DOSE AEPB Inhale 1 puff into the lungs 2 (two) times daily.  60 each  5  . isosorbide mononitrate (IMDUR) 30 MG 24 hr tablet Take 1 tablet by mouth every morning.       . lidocaine-prilocaine (EMLA) cream Apply topically as needed. Apply to port 1 hr before chemotherapy  30 g  0  . losartan-hydrochlorothiazide (HYZAAR) 50-12.5 MG per tablet Take 1 tablet by mouth daily.  90 tablet  3  . metoprolol tartrate (LOPRESSOR) 25 MG tablet Take 12.5 mg by mouth 2 (two) times daily. 12.5mg  BID      . Multiple Vitamin (MULTIVITAMIN) tablet Take 1 tablet by mouth daily.        . OXYGEN-HELIUM IN Inhale 6 L into the lungs continuous.      Marland Kitchen Phenyleph-CPM-DM-Aspirin (ALKA-SELTZER PLUS COLD & COUGH PO) Take 2 tablets by mouth daily.      . simvastatin (ZOCOR) 20 MG tablet Take 20 mg by mouth at bedtime.        Marland Kitchen tiotropium (SPIRIVA HANDIHALER) 18 MCG inhalation capsule Place 1 capsule (18 mcg total) into inhaler and inhale daily.  30 capsule  5  . UNABLE TO FIND Med Name: prostate supplement      . EQ ALLERGY RELIEF 10 MG tablet TAKE ONE TABLET BY MOUTH ONCE DAILY  30 tablet  3  . nitroGLYCERIN (NITROSTAT) 0.4 MG SL tablet Place 0.4 mg under the tongue every 5 (five) minutes as needed.        . pantoprazole (PROTONIX) 40 MG tablet Take 40 mg by mouth daily.      . prochlorperazine (COMPAZINE) 10 MG tablet Take 1 tablet (10 mg total) by mouth every 6 (six) hours as needed.  30 tablet  1   No current facility-administered medications for this visit.   Facility-Administered Medications Ordered in  Other Visits  Medication Dose Route Frequency Provider Last Rate Last Dose  . sodium chloride 0.9 % injection 10 mL  10 mL Intracatheter PRN Conni Slipper, PA-C   10 mL at 03/18/13 1607    SURGICAL HISTORY:  Past Surgical History  Procedure Laterality Date  . Pacemaker insertion  2012    For prolonged sinus pause.  Tod Persia placement4/18/12 tip svc    . Carotid endarterectomy  03/2011    With Dopplers performed most recently in 03/2011 that showed a widely patent left endarterectomy  site with no significant disease on the right.  . Coronary artery bypass graft  2000    CAD - CABG with a LIMA to his LAD, a vein to the obtuse marginal branch #1 and to the PDA  . Cardiac catheterization  06/2010    Unstable angina - Catheterized by Dr. Tresa Endo was noted to have essentially total occlusion of the native coronary arteries, a patent LIMA to the LAD & revealing a 70% stenosis at the origin of his circumflex and obtuse marginal graft, an 80% PLA lesion beyond graft insertion. Stent in distal right coronary artery. Predilated with 2.5 x 20 TREK balloon. Stent-Vision 2.75 x 23 balloon.  . Coronary angioplasty Right 06/2010    Unstable angina - Predilated with a 2.5 x 20 TREK balloon. Distal right coronary artery via  the saphenous vein graft with placement of a bare metal stent (a Vision 2.75 x 23 mm by Dr. Clarene Duke)    REVIEW OF SYSTEMS:  Constitutional: positive for fatigue Eyes: negative Ears, nose, mouth, throat, and face: negative Respiratory: negative Cardiovascular: negative Gastrointestinal: negative Genitourinary:negative Integument/breast: negative Hematologic/lymphatic: negative Musculoskeletal:negative Neurological: negative Behavioral/Psych: negative Endocrine: negative Allergic/Immunologic: negative   PHYSICAL EXAMINATION: General appearance: alert, cooperative, fatigued and no distress Head: Normocephalic, without obvious abnormality, atraumatic Neck: no adenopathy, no JVD,  supple, symmetrical, trachea midline and thyroid not enlarged, symmetric, no tenderness/mass/nodules Lymph nodes: Cervical, supraclavicular, and axillary nodes normal. Resp: clear to auscultation bilaterally Back: symmetric, no curvature. ROM normal. No CVA tenderness. Cardio: regular rate and rhythm, S1, S2 normal, no murmur, click, rub or gallop GI: soft, non-tender; bowel sounds normal; no masses,  no organomegaly Extremities: extremities normal, atraumatic, no cyanosis or edema Neurologic: Alert and oriented X 3, normal strength and tone. Normal symmetric reflexes. Normal coordination and gait  ECOG PERFORMANCE STATUS: 2 - Symptomatic, <50% confined to bed  Blood pressure 105/55, pulse 82, temperature 97 F (36.1 C), temperature source Oral, resp. rate 17, height 5\' 8"  (1.727 m), weight 159 lb 12.8 oz (72.485 kg), SpO2 100.00%, peak flow 6 L/min.  LABORATORY DATA: Lab Results  Component Value Date   WBC 12.0* 03/18/2013   HGB 8.7* 03/18/2013   HCT 27.9* 03/18/2013   MCV 94.3 03/18/2013   PLT 95* 03/18/2013      Chemistry      Component Value Date/Time   NA 142 03/18/2013 1043   NA 138 11/28/2012 1152   NA 137 11/21/2011 0945   K 3.9 03/18/2013 1043   K 4.4 11/28/2012 1152   K 4.5 11/21/2011 0945   CL 99 11/28/2012 1152   CL 100 06/21/2012 0912   CL 96* 11/21/2011 0945   CO2 30* 03/18/2013 1043   CO2 32 11/28/2012 1152   CO2 31 11/21/2011 0945   BUN 16.2 03/18/2013 1043   BUN 20 11/28/2012 1152   BUN 18 11/21/2011 0945   CREATININE 0.8 03/18/2013 1043   CREATININE 1.08 11/28/2012 1152   CREATININE 0.89 02/22/2011 1411      Component Value Date/Time   CALCIUM 9.1 03/18/2013 1043   CALCIUM 9.3 11/28/2012 1152   CALCIUM 9.4 11/21/2011 0945   CALCIUM 7.6* 09/18/2010 1136   ALKPHOS 85 03/18/2013 1043   ALKPHOS 50 11/21/2011 0945   ALKPHOS 70 02/22/2011 1411   AST 17 03/18/2013 1043   AST 24 11/21/2011 0945   AST 13 02/22/2011 1411   ALT 16 03/18/2013 1043   ALT 26 11/21/2011 0945   ALT 9  02/22/2011 1411   BILITOT 0.60  03/18/2013 1043   BILITOT 1.10 11/21/2011 0945   BILITOT 0.5 02/22/2011 1411       RADIOGRAPHIC STUDIES: Ct Chest W Contrast  02/21/2013   CLINICAL DATA:  Lung cancer followup  EXAM: CT CHEST, ABDOMEN, AND PELVIS WITH CONTRAST  TECHNIQUE: Multidetector CT imaging of the chest, abdomen and pelvis was performed following the standard protocol during bolus administration of intravenous contrast.  CONTRAST:  OMNIPAQUE IOHEXOL 300 MG/ML  SOLN  COMPARISON:  12/24/2012  FINDINGS: CT CHEST FINDINGS  No pleural effusion identified. There is advanced bolus emphysema identified. Scarring in volume loss within the left upper lobe is again noted. Changes of external beam radiation are noted within the paramediastinal left upper lobe including bronchiectasis, consolidation and fibrosis. Within the superior segment of the left lower lobe is a spiculated nodule measuring 1.5 cm, image 16/series 6. This is unchanged from previous exam. No new pulmonary parenchymal nodule or mass identified. Next  The heart size is normal. No pericardial effusion identified. No mediastinal or hilar adenopathy identified. Previous median sternotomy and CABG procedure. There is no axillary or supraclavicular adenopathy.  Review of the visualized osseous structures is negative for aggressive lytic or sclerotic bone lesion.  CT ABDOMEN AND PELVIS FINDINGS  Again noted are multi focal liver lesions. Index lesion within the dome of liver measures 3.6 cm, image 55/series 2. Previously 4.2 cm. Index lesion within the left hepatic lobe measures 2.1 cm, image 62/ series 2. Previously 3.2 cm. Within the inferior right hepatic lobe there is a 2.2 cm lesion, image 66/ series 2. Previously 2.9 cm. Larina Bras is identified within the gallbladder. There is no biliary dilatation. Normal appearance of the pancreas. The spleen is unremarkable.  Normal appearance of the right adrenal gland. Asymmetric enlargement of the left  adrenal gland is similar to previous exam. The left kidney is unremarkable. Atrophy of the lower pole of the right kidney is noted. There is a cyst within the upper pole the right kidney. The urinary bladder appears normal. There is marked enlargement of the prostate gland which measures 6.3 cm.  There is calcified atherosclerotic disease involving the abdominal aorta. No aneurysm. No upper abdominal or pelvic adenopathy identified.  The stomach is normal. The small bowel loops have a normal course and caliber without obstruction. The appendix is visualized and appears normal. Proximal colon is normal. Scattered distal colonic diverticula identified without acute inflammation.  There is no free fluid or abnormal fluid collections identified within the abdomen or pelvis.  Review of the visualized bony structures is significant for multi level lumbar degenerative disc disease. Bilateral L5 pars defects are noted. There is an anterolisthesis of L5 on S1 noted.  IMPRESSION: 1. Stable appearance of the chest. Spiculated nodule in the superior segment of left lower lobe is unchanged in size from previous exam. 2. Interval decrease in size of multi focal liver metastases. 3. No new or progressive disease identified. 4.   Electronically Signed   By: Signa Kell M.D.   On: 02/21/2013 16:36   Ir Cv Line Injection  02/05/2013   CLINICAL DATA:  79 year old with recurrent small cell lung cancer and currently receiving chemotherapy. The patient has a left subclavian Port-A-Cath which is not aspirating. Cath flo was recently placed within the catheter.  EXAM: PORT-A-CATH INJECTION WITH FLUOROSCOPY  Physician: Rachelle Hora. Henn, MD  MEDICATIONS: None  ANESTHESIA/SEDATION: Moderate sedation time: None  CONTRAST:  CONTRAST 15 mL Omnipaque 300  FLUOROSCOPY TIME:  6 seconds  PROCEDURE: The  patient was placed supine on the interventional table. The left chest port was already accessed. Omnipaque was injected through the catheter with  fluoroscopy.  COMPLICATIONS: None  FINDINGS: There is a left subclavian Port-A-Cath with the tip in the upper SVC. The port catheter is patent without discontinuity. There is a small amount of contrast pooling around the tip of the catheter during the catheter injection. Findings are compatible with a fibrin sheath. The catheter tip is likely adherent to the vessel wall. The lower SVC and right atrium are patent. Patient also has a right-sided dual lead cardiac pacemaker.  IMPRESSION: The left subclavian Port-A-Cath is positioned in the upper SVC. The tip of the catheter is likely adherent to the vein wall with a fibrin sheath.  The findings were discussed withAdrena Lajuanda Penick. The port is appropriately positioned for immediate use but recommend a port revision or TPA infusion in the future.   Electronically Signed   By: Richarda Overlie M.D.   On: 02/05/2013 14:05    ASSESSMENT AND PLAN:  This is a very pleasant 79 years old white male recently diagnosed with recurrent small cell lung cancer with multiple liver metastases and he is undergoing systemic chemotherapy with carboplatin and etoposide status post 2 cycles. The patient is feeling fine today except for the fatigue secondary to chemotherapy-induced anemia. His recent CT scan of the chest, abdomen and pelvis showed significant improvement in his disease especially in the liver. Patient was discussed with him also seen by Dr. [name]. His platelet count is slightly low at 95,000 and his hemoglobin is 8.7 g/dL today. Patient was given the option of either proceeding with chemotherapy with the dose reduction or delay chemotherapy by one week. Patient opted to proceed with chemotherapy with dose reduction today as scheduled. We will dose reduce his carboplatin to an AUC of 4 given on day 1 and the etoposide to 90 mg per meter squared given on days 1, 2 and 3 with Neulasta support given on day 4. We will closely monitor his weekly counts as he is likely to need  another blood transfusion to address the chemotherapy-induced anemia, likely next week. He'll followup with Dr. [name] in 3 weeks with restaging CT scan of the chest, abdomen and pelvis with contrast to reevaluate his disease.  Laural Benes, Roxanne Orner E, PA-C   He was advised to call immediately if he has any concerning symptoms in the interval  The patient voices understanding of current disease status and treatment options and is in agreement with the current care plan.  All questions were answered. The patient knows to call the clinic with any problems, questions or concerns. We can certainly see the patient much sooner if necessary.  ADDENDUM: Hematology/Oncology Attending: I had a face to face encounter with the patient. I recommended his care plan. The patient is a very pleasant 79 years old white male with recurrent small cell lung cancer currently undergoing systemic chemotherapy with carboplatin and etoposide status post 3 cycles. He is tolerating his treatment fairly well with no significant adverse effects. His platelets count are 95,000 and hemoglobin 8.7 G./DL. The patient would like to proceed with his treatment as scheduled today I would reduce the dose of carboplatin to AUC of 4 and etoposide to 90 mg/M2 for this cycle. We will continue to monitor his CBC closely. He would come back for follow up visit in 3 weeks with repeat CT scan of the chest, abdomen and pelvis for restaging of his disease. He was advised  to call immediately if he has any concerning symptoms in the interval. Lajuana Matte., MD 03/18/2013

## 2013-03-19 ENCOUNTER — Telehealth: Payer: Self-pay | Admitting: Cardiovascular Disease

## 2013-03-19 ENCOUNTER — Ambulatory Visit (HOSPITAL_BASED_OUTPATIENT_CLINIC_OR_DEPARTMENT_OTHER): Payer: Medicare Other

## 2013-03-19 ENCOUNTER — Telehealth: Payer: Self-pay | Admitting: Emergency Medicine

## 2013-03-19 ENCOUNTER — Other Ambulatory Visit: Payer: Self-pay

## 2013-03-19 VITALS — BP 110/42 | HR 96 | Temp 97.5°F | Resp 17

## 2013-03-19 DIAGNOSIS — C3491 Malignant neoplasm of unspecified part of right bronchus or lung: Secondary | ICD-10-CM

## 2013-03-19 DIAGNOSIS — Z5111 Encounter for antineoplastic chemotherapy: Secondary | ICD-10-CM

## 2013-03-19 DIAGNOSIS — C3410 Malignant neoplasm of upper lobe, unspecified bronchus or lung: Secondary | ICD-10-CM

## 2013-03-19 DIAGNOSIS — C787 Secondary malignant neoplasm of liver and intrahepatic bile duct: Secondary | ICD-10-CM

## 2013-03-19 MED ORDER — SODIUM CHLORIDE 0.9 % IV SOLN
Freq: Once | INTRAVENOUS | Status: AC
  Administered 2013-03-19: 11:00:00 via INTRAVENOUS

## 2013-03-19 MED ORDER — SODIUM CHLORIDE 0.9 % IV SOLN
90.0000 mg/m2 | Freq: Once | INTRAVENOUS | Status: AC
  Administered 2013-03-19: 170 mg via INTRAVENOUS
  Filled 2013-03-19: qty 8.5

## 2013-03-19 MED ORDER — SODIUM CHLORIDE 0.9 % IJ SOLN
10.0000 mL | INTRAMUSCULAR | Status: DC | PRN
  Administered 2013-03-19: 10 mL
  Filled 2013-03-19: qty 10

## 2013-03-19 MED ORDER — HEPARIN SOD (PORK) LOCK FLUSH 100 UNIT/ML IV SOLN
500.0000 [IU] | Freq: Once | INTRAVENOUS | Status: AC | PRN
  Administered 2013-03-19: 500 [IU]
  Filled 2013-03-19: qty 5

## 2013-03-19 MED ORDER — PROCHLORPERAZINE MALEATE 10 MG PO TABS
ORAL_TABLET | ORAL | Status: AC
  Filled 2013-03-19: qty 1

## 2013-03-19 MED ORDER — PROCHLORPERAZINE MALEATE 10 MG PO TABS
10.0000 mg | ORAL_TABLET | Freq: Once | ORAL | Status: AC
  Administered 2013-03-19: 10 mg via ORAL

## 2013-03-19 NOTE — Telephone Encounter (Signed)
Called wife regarding pacemaker transmission. I explained to wife that patient did not have any episodes today, and I also explained that the parameters of the device will not allow pt's HR to drop below 60 bpm. I also explained to wife that the 30s that she saw on the monitor from today could've been due to PVCs, which were seen on pt's EKG from earlier today. Wife voiced understanding.   I also told her that if she noticed any difficulty in her husbands breathing she would need to take him to the ER. She voiced understanding of this as well.  I encouraged wife to call me tomorrow if she has any issues or further questions.

## 2013-03-19 NOTE — Patient Instructions (Signed)
[facility] Discharge Instructions for Patients Receiving Chemotherapy  Today you received the following chemotherapy agent VP-16 (etoposide).  To help prevent nausea and vomiting after your treatment, we encourage you to take your nausea medication.   If you develop nausea and vomiting that is not controlled by your nausea medication, call the clinic.   BELOW ARE SYMPTOMS THAT SHOULD BE REPORTED IMMEDIATELY:  *FEVER GREATER THAN 100.5 F  *CHILLS WITH OR WITHOUT FEVER  NAUSEA AND VOMITING THAT IS NOT CONTROLLED WITH YOUR NAUSEA MEDICATION  *UNUSUAL SHORTNESS OF BREATH  *UNUSUAL BRUISING OR BLEEDING  TENDERNESS IN MOUTH AND THROAT WITH OR WITHOUT PRESENCE OF ULCERS  *URINARY PROBLEMS  *BOWEL PROBLEMS  UNUSUAL RASH Items with * indicate a potential emergency and should be followed up as soon as possible.  Feel free to call the clinic you have any questions or concerns. The clinic phone number is [phone].

## 2013-03-19 NOTE — Telephone Encounter (Signed)
Pt's wife concern, pt had chemo today and O2 ran out and SATS dropped to 83% & HR 34. Wants to know should she take to ER for eval.  Per RB can not evaluate over the phone, if pt feels bad should go to ER for Eval. If not can wait on Card for further info on what to do. Advised pt's wife and recs.  Nothing further needed

## 2013-03-19 NOTE — Progress Notes (Signed)
1238 Patient awoke from nap, states feeling tightness in chest and throat area. V/S showed irregular HR and o2 sat @ 88. Nurse checked oxygen tank, tank on empty, patient was on 6L. Patient restarted with new O2 tank after approx 1-2 minutes patient's O2 stablilized to 98% and currently @ 100%. Tiana Loft PA at chairside for eval, EKG ordered. Patient reports within approximately a few minutes after O2 running tightness easing off, oxygen better and HR @ 74.  1245 EKG completed and given to PA. Will continue to monitor.  1325 Per PA, patient ok to be D/C. VSS. Patient with no complaints and knows to call clinic/MD for any questions or concerns, and to proceed to ED should symptoms worsen. Patient and spouse gave verbal understanding. AVS provided, patient D/C via w/c with spouse.

## 2013-03-19 NOTE — Telephone Encounter (Signed)
Returned call and pt verified x 2 w/ pt's wife, [name].  Informed message received and RN reviewed note from Infusion nurse.  Wife stated pt's HR dropped in the 30s and O2 sats to 84%.  Stated O2 tank was empty and pt is on 6L and the tank doesn't indicate when it's low in the office.  Wife informed per note, pt was stabilized and discharged home w/ instructions for ER.  Wife stated she told them she wanted to take him home and call our office b/c she didn't want to take him to the hospital.  Stated pt has a pacemaker and she thinks it started kicking in when HR dropped.  Wife informed if symptoms return, pt is to be seen in ER as the office is not equipped to treat those symptoms.  Verbalized understanding.    Nada Boozer, NP notified and advised pt be seen in ER if still with low HR and O2 sat.  Stated low O2 can cause other problems and should be evaluated.    Wife informed and stated pt sees Dr. [name], pulmonologist, and he told her if pt's O2 dropped below 90% to work with him to get it back up.  If unable to, then she is to call Dr. Kavin Leech office.  Wife wanted to know if she can monitor pt overnight and if symptoms return to call 911.  Wife informed that advise is ER now, but if she is not willing to take pt to ER then call Dr. Kavin Leech office for instructions regarding O2 since it has been below 90% several times today.  Wife stated pt is on 6L O2 now and sats at 83% w/ HR 92.  Advised she call Dr. Kavin Leech office now since it's almost 4pm and to take pt to ER or call 911 for any changes in symptoms.  Wife stated pt with end stage COPD and liver CA.  Verbalized understanding and will call Dr. Kavin Leech office now.  Wife will also transmit pacemaker and informed Levora Angel, CMA w/ devices and Dr. Salena Saner will be notified.  Verbalized understanding and agreed w/ plan

## 2013-03-19 NOTE — Telephone Encounter (Signed)
Pleaae call asap-While having chem heat rate kept dropping in the 30's. His oxygen level was 89. She says she need some directions on what to do.

## 2013-03-20 ENCOUNTER — Ambulatory Visit (HOSPITAL_BASED_OUTPATIENT_CLINIC_OR_DEPARTMENT_OTHER): Payer: Medicare Other

## 2013-03-20 VITALS — BP 124/52 | HR 87 | Temp 97.7°F | Resp 20

## 2013-03-20 DIAGNOSIS — Z5111 Encounter for antineoplastic chemotherapy: Secondary | ICD-10-CM

## 2013-03-20 DIAGNOSIS — C3491 Malignant neoplasm of unspecified part of right bronchus or lung: Secondary | ICD-10-CM

## 2013-03-20 DIAGNOSIS — C787 Secondary malignant neoplasm of liver and intrahepatic bile duct: Secondary | ICD-10-CM

## 2013-03-20 DIAGNOSIS — C3410 Malignant neoplasm of upper lobe, unspecified bronchus or lung: Secondary | ICD-10-CM

## 2013-03-20 MED ORDER — HEPARIN SOD (PORK) LOCK FLUSH 100 UNIT/ML IV SOLN
500.0000 [IU] | Freq: Once | INTRAVENOUS | Status: AC
  Administered 2013-03-20: 500 [IU] via INTRAVENOUS
  Filled 2013-03-20: qty 5

## 2013-03-20 MED ORDER — PROCHLORPERAZINE MALEATE 10 MG PO TABS
10.0000 mg | ORAL_TABLET | Freq: Once | ORAL | Status: AC
  Administered 2013-03-20: 10 mg via ORAL

## 2013-03-20 MED ORDER — SODIUM CHLORIDE 0.9 % IJ SOLN
10.0000 mL | INTRAMUSCULAR | Status: DC | PRN
  Administered 2013-03-20: 10 mL via INTRAVENOUS
  Filled 2013-03-20: qty 10

## 2013-03-20 MED ORDER — SODIUM CHLORIDE 0.9 % IV SOLN
Freq: Once | INTRAVENOUS | Status: AC
  Administered 2013-03-20: 11:00:00 via INTRAVENOUS

## 2013-03-20 MED ORDER — SODIUM CHLORIDE 0.9 % IV SOLN
90.0000 mg/m2 | Freq: Once | INTRAVENOUS | Status: AC
  Administered 2013-03-20: 170 mg via INTRAVENOUS
  Filled 2013-03-20: qty 8.5

## 2013-03-20 MED ORDER — PROCHLORPERAZINE MALEATE 10 MG PO TABS
ORAL_TABLET | ORAL | Status: AC
  Filled 2013-03-20: qty 1

## 2013-03-20 NOTE — Patient Instructions (Signed)
[facility] Discharge Instructions for Patients Receiving Chemotherapy  Today you received the following chemotherapy agents: Etoposide.  To help prevent nausea and vomiting after your treatment, we encourage you to take your nausea medication as prescribed.   If you develop nausea and vomiting that is not controlled by your nausea medication, call the clinic.   BELOW ARE SYMPTOMS THAT SHOULD BE REPORTED IMMEDIATELY:  *FEVER GREATER THAN 100.5 F  *CHILLS WITH OR WITHOUT FEVER  NAUSEA AND VOMITING THAT IS NOT CONTROLLED WITH YOUR NAUSEA MEDICATION  *UNUSUAL SHORTNESS OF BREATH  *UNUSUAL BRUISING OR BLEEDING  TENDERNESS IN MOUTH AND THROAT WITH OR WITHOUT PRESENCE OF ULCERS  *URINARY PROBLEMS  *BOWEL PROBLEMS  UNUSUAL RASH Items with * indicate a potential emergency and should be followed up as soon as possible.  Feel free to call the clinic you have any questions or concerns. The clinic phone number is [phone].    

## 2013-03-21 ENCOUNTER — Ambulatory Visit (HOSPITAL_BASED_OUTPATIENT_CLINIC_OR_DEPARTMENT_OTHER): Payer: Medicare Other

## 2013-03-21 VITALS — BP 110/52 | HR 85 | Temp 97.2°F

## 2013-03-21 DIAGNOSIS — C3410 Malignant neoplasm of upper lobe, unspecified bronchus or lung: Secondary | ICD-10-CM

## 2013-03-21 DIAGNOSIS — C3491 Malignant neoplasm of unspecified part of right bronchus or lung: Secondary | ICD-10-CM

## 2013-03-21 DIAGNOSIS — Z5189 Encounter for other specified aftercare: Secondary | ICD-10-CM

## 2013-03-21 MED ORDER — PEGFILGRASTIM INJECTION 6 MG/0.6ML
6.0000 mg | Freq: Once | SUBCUTANEOUS | Status: AC
  Administered 2013-03-21: 6 mg via SUBCUTANEOUS
  Filled 2013-03-21: qty 0.6

## 2013-03-21 NOTE — Patient Instructions (Signed)

## 2013-03-25 ENCOUNTER — Other Ambulatory Visit (HOSPITAL_BASED_OUTPATIENT_CLINIC_OR_DEPARTMENT_OTHER): Payer: Medicare Other

## 2013-03-25 ENCOUNTER — Ambulatory Visit (HOSPITAL_COMMUNITY)
Admission: RE | Admit: 2013-03-25 | Discharge: 2013-03-25 | Disposition: A | Discharge status: Home / Self Care | Payer: Medicare Other | Source: Ambulatory Visit | Attending: Internal Medicine | Admitting: Internal Medicine

## 2013-03-25 ENCOUNTER — Other Ambulatory Visit: Payer: Self-pay | Admitting: *Deleted

## 2013-03-25 ENCOUNTER — Ambulatory Visit (HOSPITAL_BASED_OUTPATIENT_CLINIC_OR_DEPARTMENT_OTHER): Payer: Medicare Other

## 2013-03-25 VITALS — BP 116/58 | HR 80 | Temp 97.7°F | Resp 18

## 2013-03-25 DIAGNOSIS — C3491 Malignant neoplasm of unspecified part of right bronchus or lung: Secondary | ICD-10-CM

## 2013-03-25 DIAGNOSIS — D649 Anemia, unspecified: Secondary | ICD-10-CM

## 2013-03-25 DIAGNOSIS — C3410 Malignant neoplasm of upper lobe, unspecified bronchus or lung: Secondary | ICD-10-CM

## 2013-03-25 LAB — CBC WITH DIFFERENTIAL/PLATELET
BASO%: 0.5 % (ref 0.0–2.0)
Basophils Absolute: 0 10*3/uL (ref 0.0–0.1)
HCT: 22.2 % — ABNORMAL LOW (ref 38.4–49.9)
HGB: 7.3 g/dL — ABNORMAL LOW (ref 13.0–17.1)
LYMPH%: 4.8 % — ABNORMAL LOW (ref 14.0–49.0)
MCH: 31.3 pg (ref 27.2–33.4)
MCHC: 32.9 g/dL (ref 32.0–36.0)
MONO#: 0.1 10*3/uL (ref 0.1–0.9)
NEUT%: 92.9 % — ABNORMAL HIGH (ref 39.0–75.0)
Platelets: 56 10*3/uL — ABNORMAL LOW (ref 140–400)
WBC: 8.8 10*3/uL (ref 4.0–10.3)
lymph#: 0.4 10*3/uL — ABNORMAL LOW (ref 0.9–3.3)

## 2013-03-25 LAB — COMPREHENSIVE METABOLIC PANEL (CC13)
Anion Gap: 7 mEq/L (ref 3–11)
BUN: 26.1 mg/dL — ABNORMAL HIGH (ref 7.0–26.0)
CO2: 35 mEq/L — ABNORMAL HIGH (ref 22–29)
Calcium: 9.6 mg/dL (ref 8.4–10.4)
Chloride: 98 mEq/L (ref 98–109)
Creatinine: 0.8 mg/dL (ref 0.7–1.3)
Sodium: 140 mEq/L (ref 136–145)
Total Bilirubin: 1.53 mg/dL — ABNORMAL HIGH (ref 0.20–1.20)

## 2013-03-25 LAB — PREPARE RBC (CROSSMATCH)

## 2013-03-25 LAB — HOLD TUBE, BLOOD BANK

## 2013-03-25 MED ORDER — SODIUM CHLORIDE 0.9 % IV SOLN
250.0000 mL | Freq: Once | INTRAVENOUS | Status: AC
  Administered 2013-03-25: 250 mL via INTRAVENOUS

## 2013-03-25 MED ORDER — HEPARIN SOD (PORK) LOCK FLUSH 100 UNIT/ML IV SOLN
500.0000 [IU] | Freq: Every day | INTRAVENOUS | Status: AC | PRN
  Administered 2013-03-25: 500 [IU]
  Filled 2013-03-25: qty 5

## 2013-03-25 MED ORDER — ACETAMINOPHEN 325 MG PO TABS
ORAL_TABLET | ORAL | Status: AC
  Filled 2013-03-25: qty 2

## 2013-03-25 MED ORDER — ACETAMINOPHEN 325 MG PO TABS
650.0000 mg | ORAL_TABLET | Freq: Once | ORAL | Status: AC
  Administered 2013-03-25: 650 mg via ORAL

## 2013-03-25 MED ORDER — DIPHENHYDRAMINE HCL 25 MG PO CAPS
ORAL_CAPSULE | ORAL | Status: AC
  Filled 2013-03-25: qty 1

## 2013-03-25 MED ORDER — SODIUM CHLORIDE 0.9 % IJ SOLN
10.0000 mL | INTRAMUSCULAR | Status: AC | PRN
  Administered 2013-03-25: 10 mL
  Filled 2013-03-25: qty 10

## 2013-03-25 MED ORDER — DIPHENHYDRAMINE HCL 25 MG PO CAPS
25.0000 mg | ORAL_CAPSULE | Freq: Once | ORAL | Status: AC
  Administered 2013-03-25: 25 mg via ORAL

## 2013-03-25 NOTE — Patient Instructions (Signed)
Blood Transfusion Information WHAT IS A BLOOD TRANSFUSION? A transfusion is the replacement of blood or some of its parts. Blood is made up of multiple cells which provide different functions.  Red blood cells carry oxygen and are used for blood loss replacement.  White blood cells fight against infection.  Platelets control bleeding.  Plasma helps clot blood.  Other blood products are available for specialized needs, such as hemophilia or other clotting disorders. BEFORE THE TRANSFUSION  Who gives blood for transfusions?   You may be able to donate blood to be used at a later date on yourself (autologous donation).  Relatives can be asked to donate blood. This is generally not any safer than if you have received blood from a stranger. The same precautions are taken to ensure safety when a relative's blood is donated.  Healthy volunteers who are fully evaluated to make sure their blood is safe. This is blood bank blood. Transfusion therapy is the safest it has ever been in the practice of medicine. Before blood is taken from a donor, a complete history is taken to make sure that person has no history of diseases nor engages in risky social behavior (examples are intravenous drug use or sexual activity with multiple partners). The donor's travel history is screened to minimize risk of transmitting infections, such as malaria. The donated blood is tested for signs of infectious diseases, such as HIV and hepatitis. The blood is then tested to be sure it is compatible with you in order to minimize the chance of a transfusion reaction. If you or a relative donates blood, this is often done in anticipation of surgery and is not appropriate for emergency situations. It takes many days to process the donated blood. RISKS AND COMPLICATIONS Although transfusion therapy is very safe and saves many lives, the main dangers of transfusion include:   Getting an infectious disease.  Developing a  transfusion reaction. This is an allergic reaction to something in the blood you were given. Every precaution is taken to prevent this. The decision to have a blood transfusion has been considered carefully by your caregiver before blood is given. Blood is not given unless the benefits outweigh the risks. AFTER THE TRANSFUSION  Right after receiving a blood transfusion, you will usually feel much better and more energetic. This is especially true if your red blood cells have gotten low (anemic). The transfusion raises the level of the red blood cells which carry oxygen, and this usually causes an energy increase.  The nurse administering the transfusion will monitor you carefully for complications. HOME CARE INSTRUCTIONS  No special instructions are needed after a transfusion. You may find your energy is better. Speak with your caregiver about any limitations on activity for underlying diseases you may have. SEEK MEDICAL CARE IF:   Your condition is not improving after your transfusion.  You develop redness or irritation at the intravenous (IV) site. SEEK IMMEDIATE MEDICAL CARE IF:  Any of the following symptoms occur over the next 12 hours:  Shaking chills.  You have a temperature by mouth above 102 F (38.9 C), not controlled by medicine.  Chest, back, or muscle pain.  People around you feel you are not acting correctly or are confused.  Shortness of breath or difficulty breathing.  Dizziness and fainting.  You get a rash or develop hives.  You have a decrease in urine output.  Your urine turns a dark color or changes to pink, red, or brown. Any of the following   symptoms occur over the next 10 days:  You have a temperature by mouth above 102 F (38.9 C), not controlled by medicine.  Shortness of breath.  Weakness after normal activity.  The white part of the eye turns yellow (jaundice).  You have a decrease in the amount of urine or are urinating less often.  Your  urine turns a dark color or changes to pink, red, or brown. Document Released: 03/24/2000 Document Revised: 06/19/2011 Document Reviewed: 11/11/2007 [employer] Patient Information 2014 [employer].  

## 2013-03-26 LAB — TYPE AND SCREEN
Antibody Screen: NEGATIVE
Unit division: 0
Unit division: 0

## 2013-04-07 ENCOUNTER — Encounter (HOSPITAL_COMMUNITY): Payer: Self-pay

## 2013-04-07 ENCOUNTER — Ambulatory Visit (HOSPITAL_COMMUNITY)
Admission: RE | Admit: 2013-04-07 | Discharge: 2013-04-07 | Disposition: A | Discharge status: Home / Self Care | Payer: Medicare Other | Source: Ambulatory Visit | Attending: Physician Assistant | Admitting: Physician Assistant

## 2013-04-07 DIAGNOSIS — Z923 Personal history of irradiation: Secondary | ICD-10-CM | POA: Insufficient documentation

## 2013-04-07 DIAGNOSIS — K8020 Calculus of gallbladder without cholecystitis without obstruction: Secondary | ICD-10-CM | POA: Insufficient documentation

## 2013-04-07 DIAGNOSIS — N289 Disorder of kidney and ureter, unspecified: Secondary | ICD-10-CM | POA: Insufficient documentation

## 2013-04-07 DIAGNOSIS — J438 Other emphysema: Secondary | ICD-10-CM | POA: Insufficient documentation

## 2013-04-07 DIAGNOSIS — K4020 Bilateral inguinal hernia, without obstruction or gangrene, not specified as recurrent: Secondary | ICD-10-CM | POA: Insufficient documentation

## 2013-04-07 DIAGNOSIS — Z79899 Other long term (current) drug therapy: Secondary | ICD-10-CM | POA: Insufficient documentation

## 2013-04-07 DIAGNOSIS — N400 Enlarged prostate without lower urinary tract symptoms: Secondary | ICD-10-CM | POA: Insufficient documentation

## 2013-04-07 DIAGNOSIS — C787 Secondary malignant neoplasm of liver and intrahepatic bile duct: Secondary | ICD-10-CM | POA: Insufficient documentation

## 2013-04-07 DIAGNOSIS — M4310 Spondylolisthesis, site unspecified: Secondary | ICD-10-CM | POA: Insufficient documentation

## 2013-04-07 DIAGNOSIS — E278 Other specified disorders of adrenal gland: Secondary | ICD-10-CM | POA: Insufficient documentation

## 2013-04-07 DIAGNOSIS — C3490 Malignant neoplasm of unspecified part of unspecified bronchus or lung: Secondary | ICD-10-CM | POA: Insufficient documentation

## 2013-04-07 DIAGNOSIS — I517 Cardiomegaly: Secondary | ICD-10-CM | POA: Insufficient documentation

## 2013-04-07 DIAGNOSIS — I700 Atherosclerosis of aorta: Secondary | ICD-10-CM | POA: Insufficient documentation

## 2013-04-07 DIAGNOSIS — K5730 Diverticulosis of large intestine without perforation or abscess without bleeding: Secondary | ICD-10-CM | POA: Insufficient documentation

## 2013-04-07 MED ORDER — IOHEXOL 300 MG/ML  SOLN
100.0000 mL | Freq: Once | INTRAMUSCULAR | Status: AC | PRN
  Administered 2013-04-07: 100 mL via INTRAVENOUS

## 2013-04-08 ENCOUNTER — Ambulatory Visit (HOSPITAL_BASED_OUTPATIENT_CLINIC_OR_DEPARTMENT_OTHER): Payer: Medicare Other

## 2013-04-08 ENCOUNTER — Ambulatory Visit (HOSPITAL_BASED_OUTPATIENT_CLINIC_OR_DEPARTMENT_OTHER): Payer: Medicare Other | Admitting: Internal Medicine

## 2013-04-08 ENCOUNTER — Other Ambulatory Visit (HOSPITAL_BASED_OUTPATIENT_CLINIC_OR_DEPARTMENT_OTHER): Payer: Medicare Other

## 2013-04-08 ENCOUNTER — Encounter: Payer: Self-pay | Admitting: Internal Medicine

## 2013-04-08 ENCOUNTER — Telehealth: Payer: Self-pay | Admitting: Internal Medicine

## 2013-04-08 VITALS — BP 104/54 | HR 80 | Temp 96.6°F | Resp 18 | Ht 68.0 in | Wt 159.7 lb

## 2013-04-08 DIAGNOSIS — C3490 Malignant neoplasm of unspecified part of unspecified bronchus or lung: Secondary | ICD-10-CM

## 2013-04-08 DIAGNOSIS — C3410 Malignant neoplasm of upper lobe, unspecified bronchus or lung: Secondary | ICD-10-CM

## 2013-04-08 DIAGNOSIS — J4489 Other specified chronic obstructive pulmonary disease: Secondary | ICD-10-CM

## 2013-04-08 DIAGNOSIS — R0602 Shortness of breath: Secondary | ICD-10-CM

## 2013-04-08 DIAGNOSIS — J449 Chronic obstructive pulmonary disease, unspecified: Secondary | ICD-10-CM

## 2013-04-08 DIAGNOSIS — C3491 Malignant neoplasm of unspecified part of right bronchus or lung: Secondary | ICD-10-CM

## 2013-04-08 DIAGNOSIS — C787 Secondary malignant neoplasm of liver and intrahepatic bile duct: Secondary | ICD-10-CM

## 2013-04-08 DIAGNOSIS — R5381 Other malaise: Secondary | ICD-10-CM

## 2013-04-08 DIAGNOSIS — Z5111 Encounter for antineoplastic chemotherapy: Secondary | ICD-10-CM

## 2013-04-08 LAB — CBC WITH DIFFERENTIAL/PLATELET
BASO%: 0.2 % (ref 0.0–2.0)
EOS%: 0.9 % (ref 0.0–7.0)
HCT: 26.2 % — ABNORMAL LOW (ref 38.4–49.9)
MCHC: 34.5 g/dL (ref 32.0–36.0)
MONO#: 0.9 10*3/uL (ref 0.1–0.9)
NEUT#: 10.1 10*3/uL — ABNORMAL HIGH (ref 1.5–6.5)
NEUT%: 86 % — ABNORMAL HIGH (ref 39.0–75.0)
Platelets: 100 10*3/uL — ABNORMAL LOW (ref 140–400)
RDW: 26.9 % — ABNORMAL HIGH (ref 11.0–14.6)
WBC: 11.8 10*3/uL — ABNORMAL HIGH (ref 4.0–10.3)
lymph#: 0.6 10*3/uL — ABNORMAL LOW (ref 0.9–3.3)

## 2013-04-08 LAB — COMPREHENSIVE METABOLIC PANEL (CC13)
AST: 21 U/L (ref 5–34)
Alkaline Phosphatase: 88 U/L (ref 40–150)
CO2: 30 mEq/L — ABNORMAL HIGH (ref 22–29)
Chloride: 101 mEq/L (ref 98–109)
Potassium: 4.2 mEq/L (ref 3.5–5.1)
Sodium: 141 mEq/L (ref 136–145)
Total Bilirubin: 0.92 mg/dL (ref 0.20–1.20)

## 2013-04-08 MED ORDER — SODIUM CHLORIDE 0.9 % IV SOLN
90.0000 mg/m2 | Freq: Once | INTRAVENOUS | Status: AC
  Administered 2013-04-08: 170 mg via INTRAVENOUS
  Filled 2013-04-08: qty 8.5

## 2013-04-08 MED ORDER — SODIUM CHLORIDE 0.9 % IV SOLN
336.4000 mg | Freq: Once | INTRAVENOUS | Status: AC
  Administered 2013-04-08: 340 mg via INTRAVENOUS
  Filled 2013-04-08: qty 34

## 2013-04-08 MED ORDER — SODIUM CHLORIDE 0.9 % IV SOLN
Freq: Once | INTRAVENOUS | Status: AC
  Administered 2013-04-08: 12:00:00 via INTRAVENOUS

## 2013-04-08 MED ORDER — CARBOPLATIN CHEMO INTRADERMAL TEST DOSE 100MCG/0.02ML
100.0000 ug | Freq: Once | INTRADERMAL | Status: AC
  Administered 2013-04-08: 100 ug via INTRADERMAL
  Filled 2013-04-08: qty 0.01

## 2013-04-08 MED ORDER — HEPARIN SOD (PORK) LOCK FLUSH 100 UNIT/ML IV SOLN
500.0000 [IU] | Freq: Once | INTRAVENOUS | Status: AC | PRN
  Administered 2013-04-08: 500 [IU]
  Filled 2013-04-08: qty 5

## 2013-04-08 MED ORDER — ONDANSETRON 16 MG/50ML IVPB (CHCC)
INTRAVENOUS | Status: AC
  Filled 2013-04-08: qty 16

## 2013-04-08 MED ORDER — ONDANSETRON 16 MG/50ML IVPB (CHCC)
16.0000 mg | Freq: Once | INTRAVENOUS | Status: AC
  Administered 2013-04-08: 16 mg via INTRAVENOUS

## 2013-04-08 MED ORDER — SODIUM CHLORIDE 0.9 % IJ SOLN
10.0000 mL | INTRAMUSCULAR | Status: DC | PRN
  Administered 2013-04-08: 10 mL
  Filled 2013-04-08: qty 10

## 2013-04-08 MED ORDER — DEXAMETHASONE SODIUM PHOSPHATE 20 MG/5ML IJ SOLN
20.0000 mg | Freq: Once | INTRAMUSCULAR | Status: AC
  Administered 2013-04-08: 20 mg via INTRAVENOUS

## 2013-04-08 MED ORDER — DEXAMETHASONE SODIUM PHOSPHATE 20 MG/5ML IJ SOLN
INTRAMUSCULAR | Status: AC
  Filled 2013-04-08: qty 5

## 2013-04-08 NOTE — Patient Instructions (Signed)
CURRENT THERAPY: Systemic chemotherapy again with carboplatin for AUC of 5 on day 1 and etoposide 100 mg/M2 on days 1, 2 and 3 with Neulasta support on day 4. First  dose given on 01/14/2013. Status post 4 cycles.  CHEMOTHERAPY INTENT: Palliative  CURRENT # OF CHEMOTHERAPY CYCLES: 5 CURRENT ANTIEMETICS: Zofran, dexamethasone and Compazine  CURRENT SMOKING STATUS: Former smoker  ORAL CHEMOTHERAPY AND CONSENT: None  CURRENT BISPHOSPHONATES USE: None  PAIN MANAGEMENT: 0/10  NARCOTICS INDUCED CONSTIPATION: None  LIVING WILL AND CODE STATUS: ?

## 2013-04-08 NOTE — Patient Instructions (Signed)
[facility] Discharge Instructions for Patients Receiving Chemotherapy  Today you received the following chemotherapy agents Etoposide/Carboplatin.  To help prevent nausea and vomiting after your treatment, we encourage you to take your nausea medication as prescribed.   If you develop nausea and vomiting that is not controlled by your nausea medication, call the clinic.   BELOW ARE SYMPTOMS THAT SHOULD BE REPORTED IMMEDIATELY:  *FEVER GREATER THAN 100.5 F  *CHILLS WITH OR WITHOUT FEVER  NAUSEA AND VOMITING THAT IS NOT CONTROLLED WITH YOUR NAUSEA MEDICATION  *UNUSUAL SHORTNESS OF BREATH  *UNUSUAL BRUISING OR BLEEDING  TENDERNESS IN MOUTH AND THROAT WITH OR WITHOUT PRESENCE OF ULCERS  *URINARY PROBLEMS  *BOWEL PROBLEMS  UNUSUAL RASH Items with * indicate a potential emergency and should be followed up as soon as possible.  Feel free to call the clinic you have any questions or concerns. The clinic phone number is [phone].    

## 2013-04-08 NOTE — Progress Notes (Signed)
[facility] [phone]   [phone]  OFFICE PROGRESS NOTE  ROBERTS, [name], MD [address] [place] [address]  PRINCIPAL DIAGNOSIS: Recurrent small cell lung cancer initially diagnosed as Limited-stage small cell lung cancer diagnosed in April of 2012.   PRIOR THERAPY:  1. Status post systemic chemotherapy with carboplatin and etoposide, last dose was given 09/30/2010. This was concurrent with radiotherapy under the care of Dr. [name]. 2. Status post prophylactic cranial irradiation completed on November 29, 2010.  CURRENT THERAPY: Systemic chemotherapy again with carboplatin for AUC of 5 on day 1 and etoposide 100 mg/M2 on days 1, 2 and 3 with Neulasta support on day 4. First  dose given on 01/14/2013. Status post 4 cycles.  CHEMOTHERAPY INTENT: Palliative  CURRENT # OF CHEMOTHERAPY CYCLES: 5 CURRENT ANTIEMETICS: Zofran, dexamethasone and Compazine  CURRENT SMOKING STATUS: Former smoker  ORAL CHEMOTHERAPY AND CONSENT: None  CURRENT BISPHOSPHONATES USE: None  PAIN MANAGEMENT: 0/10  NARCOTICS INDUCED CONSTIPATION: None  LIVING WILL AND CODE STATUS: ?   INTERVAL HISTORY: BENJIMIN Barker 79 y.o. male returns to the clinic today for follow up visit accompanied by his wife and stepson and his wife. The patient tolerated the last cycle of his treatment fairly well except for the fatigue secondary to chemotherapy-induced anemia. The patient denied having any significant fever or chills. He denied having any nausea or vomiting. He has no chest pain but continues to have shortness breath with exertion secondary to severe COPD but no cough or hemoptysis. The patient denied having any significant weight loss or night sweats. He had repeat CT scan of the chest, abdomen and pelvis performed recently and he is here for evaluation and discussion of his scan results.  MEDICAL HISTORY: Past Medical History  Diagnosis Date  . Hypertension    . Heart attack 1991    Inferior- wall MI  treated with emergency PTCA with RCA by Dr. Allyson Sabal, with a perfusion time of 1 hour 55 minutes.  . Emphysema   . Hyperlipidemia   . Allergic rhinitis   . Heart failure   . Cholelithiasis   . Cardiac pacemaker     Dule-chamber permanent pacemaker - [employer] DR RF device model # [id number], serial # [id number].  Marland Kitchen COPD (chronic obstructive pulmonary disease)     Longstanding COPD. Uses oxygen - 4 L per minute.  . SOB (shortness of breath)     Transthoracic Echo 04/24/12 EF = 55-60%. Showed sinus rhythm with left atrial abnormality, left axis deviation, and almost an atypical pulmonary disease pattern.   . Paroxysmal a-fib     Permanent Transvenous pacemaker insertion by Dr. Lynnea Ferrier in 2012 and is followed by Dr. Royann Shivers.  . Coronary artery disease     Coronary bypass graft in 2000 with a LIMA to his LAD, vein to obtuse marginal branch #1 and to the PDA. Myoview stress test 03/10/09 was nonischemic. Myocardial perfusion Imaging -04/24/12 - Low risk stress nuclear study. No evidence of ischemia, however the fixed perfusion defect is more prominent.   . Carotid artery disease     Had a left carotid endarterectomy with Dopplers performed in 03/2011 that showed a widely patent left endarterectomy site with no significant disease on the right.  . Oxygen dependent     Longstanding COPD - wear 4 L per minute  . Diabetes     non-insulin-requiring diabetes  . Duodenitis 01/30/11  . Cancer  Small cell lung cancer. Received chemotherapy and radiation therapy.     ALLERGIES:  has No Known Allergies.  MEDICATIONS:  Current Outpatient Prescriptions  Medication Sig Dispense Refill  . acetaminophen (TYLENOL) 325 MG tablet Take 650 mg by mouth every 6 (six) hours as needed.        Marland Kitchen albuterol (PROVENTIL HFA;VENTOLIN HFA) 108 (90 BASE) MCG/ACT inhaler Inhale 2 puffs into the lungs every 6 (six) hours as needed for wheezing.      Marland Kitchen aspirin EC 325 MG tablet  Take 325 mg by mouth daily.       . calcium acetate (PHOSLO) 667 MG capsule Take 667 mg by mouth.      . Chlorphen-Pseudoephed-APAP (CORICIDIN D PO) Take 2 tablets by mouth daily as needed.       . clopidogrel (PLAVIX) 75 MG tablet Take 1 tablet by mouth daily.      . DiphenhydrAMINE HCl (BENADRYL ALLERGY PO) Take 1 tablet by mouth as needed.      Burman Blacksmith ALLERGY RELIEF 10 MG tablet TAKE ONE TABLET BY MOUTH ONCE DAILY  30 tablet  3  . feeding supplement (GLUCERNA SHAKE) LIQD Take 237 mLs by mouth 2 (two) times daily before a meal.        . Fluticasone-Salmeterol (ADVAIR DISKUS) 100-50 MCG/DOSE AEPB Inhale 1 puff into the lungs 2 (two) times daily.  60 each  5  . isosorbide mononitrate (IMDUR) 30 MG 24 hr tablet Take 1 tablet by mouth every morning.       . lidocaine-prilocaine (EMLA) cream Apply topically as needed. Apply to port 1 hr before chemotherapy  30 g  0  . losartan-hydrochlorothiazide (HYZAAR) 50-12.5 MG per tablet Take 1 tablet by mouth daily.  90 tablet  3  . metoprolol tartrate (LOPRESSOR) 25 MG tablet Take 12.5 mg by mouth 2 (two) times daily. 12.5mg  BID      . Multiple Vitamin (MULTIVITAMIN) tablet Take 1 tablet by mouth daily.        . nitroGLYCERIN (NITROSTAT) 0.4 MG SL tablet Place 0.4 mg under the tongue every 5 (five) minutes as needed.        . OXYGEN-HELIUM IN Inhale 6 L into the lungs continuous.      Marland Kitchen Phenyleph-CPM-DM-Aspirin (ALKA-SELTZER PLUS COLD & COUGH PO) Take 2 tablets by mouth daily.      . prochlorperazine (COMPAZINE) 10 MG tablet Take 1 tablet (10 mg total) by mouth every 6 (six) hours as needed.  30 tablet  1  . simvastatin (ZOCOR) 20 MG tablet Take 20 mg by mouth at bedtime.        Marland Kitchen tiotropium (SPIRIVA HANDIHALER) 18 MCG inhalation capsule Place 1 capsule (18 mcg total) into inhaler and inhale daily.  30 capsule  5  . UNABLE TO FIND Med Name: prostate supplement       No current facility-administered medications for this visit.    SURGICAL HISTORY:  Past  Surgical History  Procedure Laterality Date  . Pacemaker insertion  2012    For prolonged sinus pause.  Tod Persia placement4/18/12 tip svc    . Carotid endarterectomy  03/2011    With Dopplers performed most recently in 03/2011 that showed a widely patent left endarterectomy site with no significant disease on the right.  . Coronary artery bypass graft  2000    CAD - CABG with a LIMA to his LAD, a vein to the obtuse marginal branch #1 and to the PDA  . Cardiac catheterization  06/2010    Unstable angina - Catheterized by Dr. Tresa Endo was noted to have essentially total occlusion of the native coronary arteries, a patent LIMA to the LAD & revealing a 70% stenosis at the origin of his circumflex and obtuse marginal graft, an 80% PLA lesion beyond graft insertion. Stent in distal right coronary artery. Predilated with 2.5 x 20 TREK balloon. Stent-Vision 2.75 x 23 balloon.  . Coronary angioplasty Right 06/2010    Unstable angina - Predilated with a 2.5 x 20 TREK balloon. Distal right coronary artery via  the saphenous vein graft with placement of a bare metal stent (a Vision 2.75 x 23 mm by Dr. Clarene Duke)    REVIEW OF SYSTEMS:  Constitutional: positive for fatigue Eyes: negative Ears, nose, mouth, throat, and face: negative Respiratory: negative Cardiovascular: negative Gastrointestinal: negative Genitourinary:negative Integument/breast: negative Hematologic/lymphatic: negative Musculoskeletal:negative Neurological: negative Behavioral/Psych: negative Endocrine: negative Allergic/Immunologic: negative   PHYSICAL EXAMINATION: General appearance: alert, cooperative, fatigued and no distress Head: Normocephalic, without obvious abnormality, atraumatic Neck: no adenopathy, no JVD, supple, symmetrical, trachea midline and thyroid not enlarged, symmetric, no tenderness/mass/nodules Lymph nodes: Cervical, supraclavicular, and axillary nodes normal. Resp: clear to auscultation bilaterally Back:  symmetric, no curvature. ROM normal. No CVA tenderness. Cardio: regular rate and rhythm, S1, S2 normal, no murmur, click, rub or gallop GI: soft, non-tender; bowel sounds normal; no masses,  no organomegaly Extremities: extremities normal, atraumatic, no cyanosis or edema Neurologic: Alert and oriented X 3, normal strength and tone. Normal symmetric reflexes. Normal coordination and gait  ECOG PERFORMANCE STATUS: 2 - Symptomatic, <50% confined to bed  Blood pressure 104/54, pulse 80, temperature 96.6 F (35.9 C), temperature source Oral, resp. rate 18, height 5\' 8"  (1.727 m), weight 159 lb 11.2 oz (72.439 kg).  LABORATORY DATA: Lab Results  Component Value Date   WBC 11.8* 04/08/2013   HGB 9.0* 04/08/2013   HCT 26.2* 04/08/2013   MCV 106.8* 04/08/2013   PLT 100* 04/08/2013      Chemistry      Component Value Date/Time   NA 140 03/25/2013 1107   NA 138 11/28/2012 1152   NA 137 11/21/2011 0945   K 4.5 03/25/2013 1107   K 4.4 11/28/2012 1152   K 4.5 11/21/2011 0945   CL 99 11/28/2012 1152   CL 100 06/21/2012 0912   CL 96* 11/21/2011 0945   CO2 35* 03/25/2013 1107   CO2 32 11/28/2012 1152   CO2 31 11/21/2011 0945   BUN 26.1* 03/25/2013 1107   BUN 20 11/28/2012 1152   BUN 18 11/21/2011 0945   CREATININE 0.8 03/25/2013 1107   CREATININE 1.08 11/28/2012 1152   CREATININE 0.89 02/22/2011 1411      Component Value Date/Time   CALCIUM 9.6 03/25/2013 1107   CALCIUM 9.3 11/28/2012 1152   CALCIUM 9.4 11/21/2011 0945   CALCIUM 7.6* 09/18/2010 1136   ALKPHOS 107 03/25/2013 1107   ALKPHOS 50 11/21/2011 0945   ALKPHOS 70 02/22/2011 1411   AST 15 03/25/2013 1107   AST 24 11/21/2011 0945   AST 13 02/22/2011 1411   ALT 11 03/25/2013 1107   ALT 26 11/21/2011 0945   ALT 9 02/22/2011 1411   BILITOT 1.53* 03/25/2013 1107   BILITOT 1.10 11/21/2011 0945   BILITOT 0.5 02/22/2011 1411       RADIOGRAPHIC STUDIES: Ct Chest W Contrast  04/07/2013   CLINICAL DATA:  Small cell lung cancer. Restaging.  Diagnosed in 2012 with ongoing chemotherapy. Radiation therapy to lungs and brain. Worsening  shortness of Breath.  EXAM: CT CHEST, ABDOMEN, AND PELVIS WITH CONTRAST  TECHNIQUE: Multidetector CT imaging of the chest, abdomen and pelvis was performed following the standard protocol during bolus administration of intravenous contrast.  CONTRAST:  OMNIPAQUE IOHEXOL 300 MG/ML  SOLN  COMPARISON:  02/21/2013  FINDINGS: CT CHEST FINDINGS  Lungs/Pleura: Mild motion degradation throughout. Severe centrilobular emphysema. Similar radiation changes in the left paramediastinal upper lobe. Spiculated nodule in the superior segment left lower lobe which measures maximally 1.5 x 1.3 cm on transverse image 14/series 4. Similar on the prior. This appears less masslike on sagittal reformatted image 77.  No pleural fluid.  Heart/Mediastinum: No supraclavicular adenopathy. A left-sided Port-A-Cath which terminates at the mid SVC. Dual lead pacer.  Prior median sternotomy. Mild cardiomegaly, without pericardial effusion. No central pulmonary embolism, on this non-dedicated study. No mediastinal or hilar adenopathy.  CT ABDOMEN AND PELVIS FINDINGS  Abdomen/Pelvis: Index hepatic dome metastasis at 1.8 cm on image 49 versus 3.6 cm on the prior exam. Just posterior to this, a hepatic dome 1.8 cm lesion on image 49 is decreased from 2.4 cm on the prior.  1.7 cm right hepatic lobe lesion on image 57 is decreased from 2.2 cm on the prior.  Motion degradation continuing into the abdomen and upper pelvis.  Normal spleen, stomach. Suspect a nonobstructive lipoma on the transverse duodenum at 8 mm on image 73.  Grossly normal pancreas, right adrenal gland. Cholelithiasis without acute cholecystitis or biliary ductal dilatation.  Left adrenal enlargement at maximally 2.3 cm versus 2.4 cm on the prior. This is been present back to 10/13/2010.  Scarred lower pole right kidney. An interpolar right renal lesion is most consistent with a cyst.   Aortic and branch vessel atherosclerosis. No retroperitoneal or retrocrural adenopathy. Scattered colonic diverticula. Normal terminal ileum and appendix. Normal small bowel without abdominal ascites. Right greater than left bilateral fat containing inguinal hernias. No pelvic adenopathy. Normal urinary bladder. Moderate prostatomegaly. No significant free fluid. Bilateral L5 pars defects  Bones/Musculoskeletal: Bilateral L5 pars defects. No acute osseous abnormality. Grade 1 L5-S1 anterolisthesis.    IMPRESSION: CT CHEST IMPRESSION  1. Motion degraded exam. 2. Emphysema and radiation changes on the left. 3. Spiculated opacity in the superior segment left lower lobe is unchanged. This is less masslike when correlated with reformatted images. Considerations include evolving focal radiation change versus metachronous primary bronchogenic carcinoma.    CT ABDOMEN AND PELVIS IMPRESSION  1. Motion degraded exam. 2. Response to therapy of hepatic metastasis. 3. Similar left adrenal enlargement. Given chronicity, and low density on 07/08/2010 PET, likely due to an underlying adenoma. Recommend attention on follow-up to exclude unlikely "Collision tumor" .   Electronically Signed   By: Jeronimo Greaves M.D.   On: 04/07/2013 16:14   ASSESSMENT AND PLAN:  This is a very pleasant 79 years old white male recently diagnosed with recurrent small cell lung cancer with multiple liver metastases and he is undergoing systemic chemotherapy with carboplatin and etoposide status post 4 cycles. The patient is feeling fine today except for the fatigue and shortness of breath secondary to COPD. His recent CT scan of the chest, abdomen and pelvis showed further significant improvement in his disease especially in the liver.  I gave the patient the option of stopping treatment at this point versus proceeding with 2 more cycles since he continues to have improvement in his disease. The patient and his family would like to proceed with 2  more cycles of systemic  chemotherapy. He would start cycle #5 today. He would come back for follow up visit in 3 weeks with the start of cycle #6. He was advised to call immediately if he has any concerning symptoms in the interval  The patient voices understanding of current disease status and treatment options and is in agreement with the current care plan.  All questions were answered. The patient knows to call the clinic with any problems, questions or concerns. We can certainly see the patient much sooner if necessary.  I spent 15 minutes counseling the patient face to face. The total time spent in the appointment was 25 minutes.

## 2013-04-08 NOTE — Telephone Encounter (Signed)
[facility] RELATIVE APPT SCHEDULE FOR JANUARY AND FEBRUARY.

## 2013-04-09 ENCOUNTER — Ambulatory Visit (HOSPITAL_BASED_OUTPATIENT_CLINIC_OR_DEPARTMENT_OTHER): Payer: Medicare Other

## 2013-04-09 ENCOUNTER — Telehealth: Payer: Self-pay | Admitting: Internal Medicine

## 2013-04-09 VITALS — BP 104/48 | HR 89 | Temp 97.1°F | Resp 18

## 2013-04-09 DIAGNOSIS — Z5111 Encounter for antineoplastic chemotherapy: Secondary | ICD-10-CM

## 2013-04-09 DIAGNOSIS — C3410 Malignant neoplasm of upper lobe, unspecified bronchus or lung: Secondary | ICD-10-CM

## 2013-04-09 DIAGNOSIS — C3491 Malignant neoplasm of unspecified part of right bronchus or lung: Secondary | ICD-10-CM

## 2013-04-09 DIAGNOSIS — C787 Secondary malignant neoplasm of liver and intrahepatic bile duct: Secondary | ICD-10-CM

## 2013-04-09 MED ORDER — PROCHLORPERAZINE MALEATE 10 MG PO TABS
ORAL_TABLET | ORAL | Status: AC
  Filled 2013-04-09: qty 1

## 2013-04-09 MED ORDER — SODIUM CHLORIDE 0.9 % IV SOLN
Freq: Once | INTRAVENOUS | Status: AC
  Administered 2013-04-09: 09:00:00 via INTRAVENOUS

## 2013-04-09 MED ORDER — PROCHLORPERAZINE MALEATE 10 MG PO TABS
10.0000 mg | ORAL_TABLET | Freq: Once | ORAL | Status: AC
  Administered 2013-04-09: 10 mg via ORAL

## 2013-04-09 MED ORDER — HEPARIN SOD (PORK) LOCK FLUSH 100 UNIT/ML IV SOLN
500.0000 [IU] | Freq: Once | INTRAVENOUS | Status: AC | PRN
  Administered 2013-04-09: 500 [IU]
  Filled 2013-04-09: qty 5

## 2013-04-09 MED ORDER — SODIUM CHLORIDE 0.9 % IJ SOLN
10.0000 mL | INTRAMUSCULAR | Status: DC | PRN
  Administered 2013-04-09: 10 mL
  Filled 2013-04-09: qty 10

## 2013-04-09 MED ORDER — SODIUM CHLORIDE 0.9 % IV SOLN
90.0000 mg/m2 | Freq: Once | INTRAVENOUS | Status: AC
  Administered 2013-04-09: 170 mg via INTRAVENOUS
  Filled 2013-04-09: qty 8.5

## 2013-04-11 ENCOUNTER — Ambulatory Visit (HOSPITAL_BASED_OUTPATIENT_CLINIC_OR_DEPARTMENT_OTHER): Payer: Medicare Other

## 2013-04-11 VITALS — BP 103/60 | HR 82 | Temp 96.7°F

## 2013-04-11 DIAGNOSIS — C787 Secondary malignant neoplasm of liver and intrahepatic bile duct: Secondary | ICD-10-CM

## 2013-04-11 DIAGNOSIS — C3491 Malignant neoplasm of unspecified part of right bronchus or lung: Secondary | ICD-10-CM

## 2013-04-11 DIAGNOSIS — C3410 Malignant neoplasm of upper lobe, unspecified bronchus or lung: Secondary | ICD-10-CM

## 2013-04-11 DIAGNOSIS — Z5111 Encounter for antineoplastic chemotherapy: Secondary | ICD-10-CM

## 2013-04-11 MED ORDER — PROCHLORPERAZINE MALEATE 10 MG PO TABS
10.0000 mg | ORAL_TABLET | Freq: Once | ORAL | Status: AC
Start: 2013-04-11 — End: 2013-04-11
  Administered 2013-04-11: 10 mg via ORAL

## 2013-04-11 MED ORDER — SODIUM CHLORIDE 0.9 % IJ SOLN
10.0000 mL | INTRAMUSCULAR | Status: DC | PRN
Start: 2013-04-11 — End: 2013-04-11
  Administered 2013-04-11: 10 mL
  Filled 2013-04-11: qty 10

## 2013-04-11 MED ORDER — PROCHLORPERAZINE MALEATE 10 MG PO TABS
ORAL_TABLET | ORAL | Status: AC
  Filled 2013-04-11: qty 1

## 2013-04-11 MED ORDER — SODIUM CHLORIDE 0.9 % IV SOLN
90.0000 mg/m2 | Freq: Once | INTRAVENOUS | Status: AC
  Administered 2013-04-11: 170 mg via INTRAVENOUS
  Filled 2013-04-11: qty 8.5

## 2013-04-11 MED ORDER — SODIUM CHLORIDE 0.9 % IV SOLN
Freq: Once | INTRAVENOUS | Status: AC
  Administered 2013-04-11: 10:00:00 via INTRAVENOUS

## 2013-04-11 MED ORDER — HEPARIN SOD (PORK) LOCK FLUSH 100 UNIT/ML IV SOLN
500.0000 [IU] | Freq: Once | INTRAVENOUS | Status: AC | PRN
  Administered 2013-04-11: 500 [IU]
  Filled 2013-04-11: qty 5

## 2013-04-11 NOTE — Patient Instructions (Signed)
[facility] Discharge Instructions for Patients Receiving Chemotherapy  Today you received the following chemotherapy agents :  Etoposide.  To help prevent nausea and vomiting after your treatment, we encourage you to take your nausea medication as instructed by your physician.   If you develop nausea and vomiting that is not controlled by your nausea medication, call the clinic.   BELOW ARE SYMPTOMS THAT SHOULD BE REPORTED IMMEDIATELY:  *FEVER GREATER THAN 100.5 F  *CHILLS WITH OR WITHOUT FEVER  NAUSEA AND VOMITING THAT IS NOT CONTROLLED WITH YOUR NAUSEA MEDICATION  *UNUSUAL SHORTNESS OF BREATH  *UNUSUAL BRUISING OR BLEEDING  TENDERNESS IN MOUTH AND THROAT WITH OR WITHOUT PRESENCE OF ULCERS  *URINARY PROBLEMS  *BOWEL PROBLEMS  UNUSUAL RASH Items with * indicate a potential emergency and should be followed up as soon as possible.  Feel free to call the clinic you have any questions or concerns. The clinic phone number is [phone].

## 2013-04-12 ENCOUNTER — Ambulatory Visit (HOSPITAL_BASED_OUTPATIENT_CLINIC_OR_DEPARTMENT_OTHER): Payer: Medicare Other

## 2013-04-12 VITALS — BP 111/56 | HR 73 | Temp 96.0°F | Resp 24

## 2013-04-12 DIAGNOSIS — C787 Secondary malignant neoplasm of liver and intrahepatic bile duct: Secondary | ICD-10-CM

## 2013-04-12 DIAGNOSIS — C3410 Malignant neoplasm of upper lobe, unspecified bronchus or lung: Secondary | ICD-10-CM

## 2013-04-12 DIAGNOSIS — C3491 Malignant neoplasm of unspecified part of right bronchus or lung: Secondary | ICD-10-CM

## 2013-04-12 MED ORDER — PEGFILGRASTIM INJECTION 6 MG/0.6ML
6.0000 mg | Freq: Once | SUBCUTANEOUS | Status: AC
  Administered 2013-04-12: 6 mg via SUBCUTANEOUS

## 2013-04-12 NOTE — Patient Instructions (Signed)

## 2013-04-14 ENCOUNTER — Telehealth: Payer: Self-pay | Admitting: Medical Oncology

## 2013-04-14 NOTE — Telephone Encounter (Signed)
Low BP taken by wife 75/34. Pt not feeling well has been sleeping for 3 days. Chemo last week . Wife calling EMS to pick up pt. for transport to ED

## 2013-04-15 ENCOUNTER — Ambulatory Visit (HOSPITAL_BASED_OUTPATIENT_CLINIC_OR_DEPARTMENT_OTHER): Payer: Medicare Other

## 2013-04-15 ENCOUNTER — Ambulatory Visit (HOSPITAL_COMMUNITY)
Admission: RE | Admit: 2013-04-15 | Discharge: 2013-04-15 | Disposition: A | Discharge status: Home / Self Care | Payer: Medicare Other | Source: Ambulatory Visit | Attending: Internal Medicine | Admitting: Internal Medicine

## 2013-04-15 ENCOUNTER — Other Ambulatory Visit: Payer: Self-pay | Admitting: *Deleted

## 2013-04-15 ENCOUNTER — Other Ambulatory Visit (HOSPITAL_BASED_OUTPATIENT_CLINIC_OR_DEPARTMENT_OTHER): Payer: Medicare Other

## 2013-04-15 VITALS — BP 100/53 | HR 89 | Temp 98.0°F | Resp 20

## 2013-04-15 DIAGNOSIS — C3490 Malignant neoplasm of unspecified part of unspecified bronchus or lung: Secondary | ICD-10-CM

## 2013-04-15 DIAGNOSIS — D649 Anemia, unspecified: Secondary | ICD-10-CM

## 2013-04-15 DIAGNOSIS — C3491 Malignant neoplasm of unspecified part of right bronchus or lung: Secondary | ICD-10-CM

## 2013-04-15 LAB — COMPREHENSIVE METABOLIC PANEL (CC13)
ALK PHOS: 108 U/L (ref 40–150)
ALT: 15 U/L (ref 0–55)
AST: 15 U/L (ref 5–34)
Albumin: 4 g/dL (ref 3.5–5.0)
Anion Gap: 7 mEq/L (ref 3–11)
BUN: 24 mg/dL (ref 7.0–26.0)
CALCIUM: 9.4 mg/dL (ref 8.4–10.4)
CO2: 34 meq/L — AB (ref 22–29)
Chloride: 97 mEq/L — ABNORMAL LOW (ref 98–109)
Creatinine: 0.8 mg/dL (ref 0.7–1.3)
Glucose: 131 mg/dl (ref 70–140)
POTASSIUM: 4.3 meq/L (ref 3.5–5.1)
Sodium: 138 mEq/L (ref 136–145)
TOTAL PROTEIN: 6.6 g/dL (ref 6.4–8.3)
Total Bilirubin: 2.27 mg/dL — ABNORMAL HIGH (ref 0.20–1.20)

## 2013-04-15 LAB — CBC WITH DIFFERENTIAL/PLATELET
BASO%: 0.1 % (ref 0.0–2.0)
BASOS ABS: 0 10*3/uL (ref 0.0–0.1)
EOS%: 0.1 % (ref 0.0–7.0)
Eosinophils Absolute: 0 10*3/uL (ref 0.0–0.5)
HCT: 21.9 % — ABNORMAL LOW (ref 38.4–49.9)
HEMOGLOBIN: 7.3 g/dL — AB (ref 13.0–17.1)
LYMPH%: 1.3 % — ABNORMAL LOW (ref 14.0–49.0)
MCH: 34.4 pg — ABNORMAL HIGH (ref 27.2–33.4)
MCHC: 33.5 g/dL (ref 32.0–36.0)
MCV: 102.8 fL — AB (ref 79.3–98.0)
MONO#: 0.3 10*3/uL (ref 0.1–0.9)
MONO%: 1.3 % (ref 0.0–14.0)
NEUT%: 97.2 % — ABNORMAL HIGH (ref 39.0–75.0)
NEUTROS ABS: 18.7 10*3/uL — AB (ref 1.5–6.5)
PLATELETS: 107 10*3/uL — AB (ref 140–400)
RBC: 2.13 10*6/uL — ABNORMAL LOW (ref 4.20–5.82)
RDW: 26.4 % — ABNORMAL HIGH (ref 11.0–14.6)
WBC: 19.3 10*3/uL — AB (ref 4.0–10.3)
lymph#: 0.2 10*3/uL — ABNORMAL LOW (ref 0.9–3.3)

## 2013-04-15 LAB — HOLD TUBE, BLOOD BANK

## 2013-04-15 MED ORDER — DIPHENHYDRAMINE HCL 25 MG PO CAPS
25.0000 mg | ORAL_CAPSULE | Freq: Once | ORAL | Status: AC
  Administered 2013-04-15: 25 mg via ORAL

## 2013-04-15 MED ORDER — HEPARIN SOD (PORK) LOCK FLUSH 100 UNIT/ML IV SOLN
500.0000 [IU] | Freq: Once | INTRAVENOUS | Status: AC
  Administered 2013-04-15: 500 [IU] via INTRAVENOUS
  Filled 2013-04-15: qty 5

## 2013-04-15 MED ORDER — ACETAMINOPHEN 325 MG PO TABS
650.0000 mg | ORAL_TABLET | Freq: Once | ORAL | Status: AC
  Administered 2013-04-15: 650 mg via ORAL

## 2013-04-15 MED ORDER — SODIUM CHLORIDE 0.9 % IJ SOLN
10.0000 mL | INTRAMUSCULAR | Status: DC | PRN
  Administered 2013-04-15: 10 mL via INTRAVENOUS
  Filled 2013-04-15: qty 10

## 2013-04-15 MED ORDER — DIPHENHYDRAMINE HCL 25 MG PO CAPS
ORAL_CAPSULE | ORAL | Status: AC
  Filled 2013-04-15: qty 1

## 2013-04-15 MED ORDER — SODIUM CHLORIDE 0.9 % IV SOLN
250.0000 mL | Freq: Once | INTRAVENOUS | Status: AC
  Administered 2013-04-15: 250 mL via INTRAVENOUS

## 2013-04-15 MED ORDER — ACETAMINOPHEN 325 MG PO TABS
ORAL_TABLET | ORAL | Status: AC
  Filled 2013-04-15: qty 2

## 2013-04-15 NOTE — Patient Instructions (Signed)
Blood Transfusion  A blood transfusion replaces your blood or some of its parts. Blood is replaced when you have lost blood because of surgery, an accident, or for severe blood conditions like anemia. You can donate blood to be used on yourself if you have a planned surgery. If you lose blood during that surgery, your own blood can be given back to you. Any blood given to you is checked to make sure it matches your blood type. Your temperature, blood pressure, and heart rate (vital signs) will be checked often.  GET HELP RIGHT AWAY IF:   You feel sick to your stomach (nauseous) or throw up (vomit).  You have watery poop (diarrhea).  You have shortness of breath or trouble breathing.  You have blood in your pee (urine) or have dark colored pee.  You have chest pain or tightness.  Your eyes or skin turn yellow (jaundice).  You have a temperature by mouth above 102 F (38.9 C), not controlled by medicine.  You start to shake and have chills.  You develop a a red rash (hives) or feel itchy.  You develop lightheadedness or feel confused.  You develop back, joint, or muscle pain.  You do not feel hungry (lost appetite).  You feel tired, restless, or nervous.  You develop belly (abdominal) cramps. Document Released: 06/23/2008 Document Revised: 06/19/2011 Document Reviewed: 06/23/2008 [employer] Patient Information 2014 [employer].  

## 2013-04-16 ENCOUNTER — Ambulatory Visit (INDEPENDENT_AMBULATORY_CARE_PROVIDER_SITE_OTHER): Payer: Medicare Other | Admitting: Emergency Medicine

## 2013-04-16 ENCOUNTER — Ambulatory Visit (HOSPITAL_BASED_OUTPATIENT_CLINIC_OR_DEPARTMENT_OTHER): Payer: Medicare Other

## 2013-04-16 ENCOUNTER — Encounter: Payer: Self-pay | Admitting: Emergency Medicine

## 2013-04-16 ENCOUNTER — Other Ambulatory Visit: Payer: Self-pay | Admitting: *Deleted

## 2013-04-16 VITALS — BP 130/78 | HR 89 | Ht 68.0 in | Wt 164.4 lb

## 2013-04-16 VITALS — BP 94/51 | HR 82 | Temp 97.2°F | Resp 18

## 2013-04-16 DIAGNOSIS — D649 Anemia, unspecified: Secondary | ICD-10-CM

## 2013-04-16 DIAGNOSIS — C3490 Malignant neoplasm of unspecified part of unspecified bronchus or lung: Secondary | ICD-10-CM

## 2013-04-16 MED ORDER — HEPARIN SOD (PORK) LOCK FLUSH 100 UNIT/ML IV SOLN
500.0000 [IU] | Freq: Every day | INTRAVENOUS | Status: AC | PRN
  Administered 2013-04-16: 500 [IU]
  Filled 2013-04-16: qty 5

## 2013-04-16 MED ORDER — ACETAMINOPHEN 325 MG PO TABS
ORAL_TABLET | ORAL | Status: AC
  Filled 2013-04-16: qty 2

## 2013-04-16 MED ORDER — SODIUM CHLORIDE 0.9 % IJ SOLN
10.0000 mL | INTRAMUSCULAR | Status: AC | PRN
  Administered 2013-04-16: 10 mL
  Filled 2013-04-16: qty 10

## 2013-04-16 MED ORDER — FLUTTER DEVI: Status: AC

## 2013-04-16 MED ORDER — SODIUM CHLORIDE 0.9 % IV SOLN
250.0000 mL | Freq: Once | INTRAVENOUS | Status: AC
  Administered 2013-04-16: 250 mL via INTRAVENOUS

## 2013-04-16 MED ORDER — DIPHENHYDRAMINE HCL 25 MG PO CAPS
25.0000 mg | ORAL_CAPSULE | Freq: Once | ORAL | Status: AC
  Administered 2013-04-16: 25 mg via ORAL

## 2013-04-16 MED ORDER — DIPHENHYDRAMINE HCL 25 MG PO CAPS
ORAL_CAPSULE | ORAL | Status: AC
  Filled 2013-04-16: qty 1

## 2013-04-16 MED ORDER — ACETAMINOPHEN 325 MG PO TABS
650.0000 mg | ORAL_TABLET | Freq: Once | ORAL | Status: AC
  Administered 2013-04-16: 650 mg via ORAL

## 2013-04-16 NOTE — Assessment & Plan Note (Signed)
Finishing chemo. They had a lot of questions today about his dyspnea, his functional capacity. They question the benefits of chemo - although his CT scan is stable to better - and wanted to at least discuss hospice. For now he is going to finish his chemo, follow to see if he feels better (which he may). We will discuss next steps with Dr Julien Nordmann at that time.

## 2013-04-16 NOTE — Patient Instructions (Signed)
Please continue your current inhaled medications and oxygen Follow with Dr Lamonte Sakai in 3 months or sooner if you have any problems.

## 2013-04-16 NOTE — Patient Instructions (Signed)
Blood Transfusion Information WHAT IS A BLOOD TRANSFUSION? A transfusion is the replacement of blood or some of its parts. Blood is made up of multiple cells which provide different functions.  Red blood cells carry oxygen and are used for blood loss replacement.  White blood cells fight against infection.  Platelets control bleeding.  Plasma helps clot blood.  Other blood products are available for specialized needs, such as hemophilia or other clotting disorders. BEFORE THE TRANSFUSION  Who gives blood for transfusions?   You may be able to donate blood to be used at a later date on yourself (autologous donation).  Relatives can be asked to donate blood. This is generally not any safer than if you have received blood from a stranger. The same precautions are taken to ensure safety when a relative's blood is donated.  Healthy volunteers who are fully evaluated to make sure their blood is safe. This is blood bank blood. Transfusion therapy is the safest it has ever been in the practice of medicine. Before blood is taken from a donor, a complete history is taken to make sure that person has no history of diseases nor engages in risky social behavior (examples are intravenous drug use or sexual activity with multiple partners). The donor's travel history is screened to minimize risk of transmitting infections, such as malaria. The donated blood is tested for signs of infectious diseases, such as HIV and hepatitis. The blood is then tested to be sure it is compatible with you in order to minimize the chance of a transfusion reaction. If you or a relative donates blood, this is often done in anticipation of surgery and is not appropriate for emergency situations. It takes many days to process the donated blood. RISKS AND COMPLICATIONS Although transfusion therapy is very safe and saves many lives, the main dangers of transfusion include:   Getting an infectious disease.  Developing a  transfusion reaction. This is an allergic reaction to something in the blood you were given. Every precaution is taken to prevent this. The decision to have a blood transfusion has been considered carefully by your caregiver before blood is given. Blood is not given unless the benefits outweigh the risks. AFTER THE TRANSFUSION  Right after receiving a blood transfusion, you will usually feel much better and more energetic. This is especially true if your red blood cells have gotten low (anemic). The transfusion raises the level of the red blood cells which carry oxygen, and this usually causes an energy increase.  The nurse administering the transfusion will monitor you carefully for complications. HOME CARE INSTRUCTIONS  No special instructions are needed after a transfusion. You may find your energy is better. Speak with your caregiver about any limitations on activity for underlying diseases you may have. SEEK MEDICAL CARE IF:   Your condition is not improving after your transfusion.  You develop redness or irritation at the intravenous (IV) site. SEEK IMMEDIATE MEDICAL CARE IF:  Any of the following symptoms occur over the next 12 hours:  Shaking chills.  You have a temperature by mouth above 102 F (38.9 C), not controlled by medicine.  Chest, back, or muscle pain.  People around you feel you are not acting correctly or are confused.  Shortness of breath or difficulty breathing.  Dizziness and fainting.  You get a rash or develop hives.  You have a decrease in urine output.  Your urine turns a dark color or changes to pink, red, or brown. Any of the following   symptoms occur over the next 10 days:  You have a temperature by mouth above 102 F (38.9 C), not controlled by medicine.  Shortness of breath.  Weakness after normal activity.  The white part of the eye turns yellow (jaundice).  You have a decrease in the amount of urine or are urinating less often.  Your  urine turns a dark color or changes to pink, red, or brown. Document Released: 03/24/2000 Document Revised: 06/19/2011 Document Reviewed: 11/11/2007 [employer] Patient Information 2014 [employer].  

## 2013-04-16 NOTE — Progress Notes (Signed)
Subjective:    Patient ID: [name], male    DOB: 05/16/1930, 80 y.o.   MRN: [id number] HPI 80 yo man, former tobacco 100 pk-yr, CAD s/p CABG and PTCI, hx small cell lung CA in 3/12 s/p chemo and XRT last in 6/12, just completed whole brain radiation. F/u imaging with improvement in the small cell CA, to have repeat CT in October. He presents today with fatigue, exertional dyspnea. Had been on Advair prior to the lung CA. He does not have SABA at this time. He was started on O2, doesn't wear reliably, needs it w exertion.   ROV 12/30/10 -- follow up, hx CAD, tobacco w COPD, small cell lung CA s/p chemo and XRT.  Last time we added Spiriva to Advair - he believes that his breathing is much better. He has not been wearing his his O2 as reliably. He had PFT in April '12. He has some congestion/URI type symptoms. Improved. Still with some UA irritation.   ROV 03/29/11 -- follow up, hx CAD, tobacco w COPD, small cell lung CA s/p chemo and XRT.  He has finished brain XRT. Was admitted to [facility] with severe nausea, GERD, wt loss.  He is doing much better now - planning for CT chest and brain 04/13/10 Julien Nordmann and [place]). He is tolerating Advair and Spiriva. Hoarse voice is better now that GERD is treated. He has not needed SABA.   ROV 06/28/11 -- COPD with SCLCA s/p chemo + XRT with great response. Due for repeat CT scan in April. He has been much more active, has more energy. Breathing well, is not using O2 frequently. He is on Advair, Spiriva. Has SABA available.   ROV 09/21/11 -- COPD with SCLCA s/p chemo + XRT with great response. His wife says that he has not been doing well, was treated for bronchitis in April w abx and resolved. Then had episode hemoptysis, prompted more abx and a break from ASA and plavix. Had CT scan in 4/13 that showed LUL radiation changes.  Has been wearing O2 at all times.   ROV 12/18/11 -- COPD with SCLCA s/p chemo + XRT with great response. Returns for f/u/.  He says that his  breathing has been somewhat worse with exertion and when he is full after a meal. On advair + spiriva. Bothers him in the am. He is skipping/forgetting the spiriva some times. He uses albuterol about 2 -3 x a day.   ROV 04/01/12 -- COPD with SCLCA s/p chemo + XRT with great response. Returns for f/u. He is having much more exertional SOB over the last yr. Using Spiriva and Advair reliably, uses albuterol up to 4x a day. he follows with Dr Gwenlyn Found, ? Whether this is a cardiac limitation.   ROV 05/10/12 -- COPD with SCLCA s/p chemo + XRT with great response. Returns today after PFT to characterize worsening exertional SOB. He also had a low risk perfusion scan 04/24/12 that showed a fixed perfusion defect. His AFL is very severely reduced, DLCO low.  Last time we increased o2 to 4L/min w exertion, 3L/min at rest. He believes this has helped him.     PULMONARY FUNCTON TEST 05/10/2012  FVC 2.14  FEV1 .81  FEV1/FVC 37.9  FVC  % Predicted 56  FEV % Predicted 33  FeF 25-75 .26  FeF 25-75 % Predicted 2.15    Acute OV 07/24/12 --  COPD with SCLCA s/p chemo + XRT with great response. 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 He  called [place] 3-4 d ago for episodic exertional SOB associated with hypoxemia. I ordered O2 titration study. Today he reports that his breathing is improved. He has a bit less congestion, URI seems to be improving.  He doesn't have wheeze. Cough is better.   ROV 10/24/12 --  COPD with SCLCA s/p chemo + XRT with great response. 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 He is having more exertional sob, increased to 5L/min.  Will ask [facility] to titrate O2 and get an oximizer. More nose drainage, bothering his throat. Taking benadryl prn rarely. He is on lisinopril.   ROV 02/06/13 -- COPD, severe AFL on spiro, with SCLCA s/p chemo + XRT with good response. He has now discovered recurrent disease with liver and renal lesions, biopsy proven. Dr Julien Nordmann is planning for repeat chemo (same regimen as before). He is having progressive SOB with  exertion.  Spiriva + Advair + albuterol prn.   ROV 04/16/13 -- COPD, severe AFL on spiro, with SCLCA s/p chemo + XRT with good response.  He has completed 5 weeks of chemo, is due for another week. He is considering backing off on therapy as it is becoming lower yield. Stable med regimen. He feels run down.     TTE 04/24/12 --  Study Conclusions - Left ventricle: The cavity size was normal. Wall thickness was increased in a pattern of mild LVH. There was mild concentric hypertrophy. Systolic function was normal. The estimated ejection fraction was in the range of 55% to 60%. Wall motion was normal; there were no regional wall motion abnormalities. Doppler parameters are consistent with abnormal left ventricular relaxation (grade 1 diastolic dysfunction). - Mitral valve: Mild regurgitation. Valve area by pressure half-time: 2.06cm^2. - Right ventricle: Systolic pressure was increased. - Atrial septum: No defect or patent foramen ovale was identified. - Tricuspid valve: Mild-moderate regurgitation. - Pulmonary arteries: PA peak pressure: 69mm Hg (S). Impressions: - The right ventricular systolic pressure was increased consistent with moderate pulmonary hypertension.   CT scan 04/07/14 --  COMPARISON: 02/21/2013  FINDINGS:  CT CHEST FINDINGS  Lungs/Pleura: Mild motion degradation throughout. Severe  centrilobular emphysema. Similar radiation changes in the left  paramediastinal upper lobe. Spiculated nodule in the superior  segment left lower lobe which measures maximally 1.5 x 1.3 cm on  transverse image 14/series 4. Similar on the prior. This appears  less masslike on sagittal reformatted image 77.  No pleural fluid.  Heart/Mediastinum: No supraclavicular adenopathy. A left-sided  Port-A-Cath which terminates at the mid SVC. Dual lead pacer.  Prior median sternotomy. Mild cardiomegaly, without pericardial  effusion. No central pulmonary embolism, on this non-dedicated  study.  No mediastinal or hilar adenopathy.  CT ABDOMEN AND PELVIS FINDINGS  Abdomen/Pelvis: Index hepatic dome metastasis at 1.8 cm on image 49  versus 3.6 cm on the prior exam. Just posterior to this, a hepatic  dome 1.8 cm lesion on image 49 is decreased from 2.4 cm on the  prior.  1.7 cm right hepatic lobe lesion on image 57 is decreased from 2.2  cm on the prior.  Motion degradation continuing into the abdomen and upper pelvis.  Normal spleen, stomach. Suspect a nonobstructive lipoma on the  transverse duodenum at 8 mm on image 73.  Grossly normal pancreas, right adrenal gland. Cholelithiasis without  acute cholecystitis or biliary ductal dilatation.  Left adrenal enlargement at maximally 2.3 cm versus 2.4 cm on the  prior. This is been present back to 10/13/2010.  Scarred lower pole right kidney. An interpolar  right renal lesion is  most consistent with a cyst.  Aortic and branch vessel atherosclerosis. No retroperitoneal or  retrocrural adenopathy. Scattered colonic diverticula. Normal  terminal ileum and appendix. Normal small bowel without abdominal  ascites. Right greater than left bilateral fat containing inguinal  hernias. No pelvic adenopathy. Normal urinary bladder. Moderate  prostatomegaly. No significant free fluid. Bilateral L5 pars defects  Bones/Musculoskeletal: Bilateral L5 pars defects. No acute osseous  abnormality. Grade 1 L5-S1 anterolisthesis.  IMPRESSION:  CT CHEST IMPRESSION  1. Motion degraded exam.  2. Emphysema and radiation changes on the left.  3. Spiculated opacity in the superior segment left lower lobe is  unchanged. This is less masslike when correlated with reformatted  images. Considerations include evolving focal radiation change  versus metachronous primary bronchogenic carcinoma.  CT ABDOMEN AND PELVIS IMPRESSION  1. Motion degraded exam.  2. Response to therapy of hepatic metastasis.  3. Similar left adrenal enlargement. Given chronicity, and low   density on 07/08/2010 PET, likely due to an underlying adenoma.    Objective:   Physical Exam Filed Vitals:   04/16/13 1551  BP: 130/78  Pulse: 89  Height: 5\' 8"  (1.727 m)  Weight: 164 lb 6.4 oz (74.571 kg)  SpO2: 97%   Gen: Pleasant, well-nourished, in no distress,  normal affect  ENT: No lesions,  mouth clear,  oropharynx clear, no postnasal drip, mild hoarse voice  Neck: No JVD, no TMG, no carotid bruits  Lungs: No use of accessory muscles, very distant, no wheeze  Cardiovascular: RRR, heart sounds normal, no murmur or gallops, trace pretibial peripheral edema  Musculoskeletal: No deformities, no cyanosis or clubbing  Neuro: alert, non focal  Skin: Warm, no lesions or rashes     Assessment & Plan:  Small cell lung cancer Finishing chemo. They had a lot of questions today about his dyspnea, his functional capacity. They question the benefits of chemo - although his CT scan is stable to better - and wanted to at least discuss hospice. For now he is going to finish his chemo, follow to see if he feels better (which he may). We will discuss next steps with Dr Julien Nordmann at that time.

## 2013-04-17 ENCOUNTER — Telehealth: Payer: Self-pay | Admitting: *Deleted

## 2013-04-17 LAB — TYPE AND SCREEN
ABO/RH(D): O NEG
Antibody Screen: NEGATIVE
Unit division: 0
Unit division: 0

## 2013-04-17 NOTE — Telephone Encounter (Signed)
Pt's wife called stating she and patient had a long discussion with Dr Lamonte Sakai yesterday.  Pt feels he wants to stop chemotherapy.  He is due to come back on 04/29/13 for his next cycle and f/u.  He will discuss further at f/u visit.  Will inform Dr Vista Mink.  Pt's wife also requested to cancel weekly lab appt for 04/22/13.  SLJ

## 2013-04-21 ENCOUNTER — Telehealth: Payer: Self-pay | Admitting: *Deleted

## 2013-04-21 NOTE — Telephone Encounter (Signed)
From: Mccormack,Tyden D    Sent: 04/20/2013  1:05 PM EST      To: Collene Gobble., MD Subject: [id number]  Based on what you said at the Visit [name] has decided to stop his Chemo treatments and does not want to do the Labs on Tuesday and I have had to talk him into seeing Dr. Earlie Server on the 20th,. He may or may not since he feels like the Treatments are making breathing more difficult.  We were all convinced that the COPD was making everything else so difficult to tolerate.[name], CMA

## 2013-04-22 ENCOUNTER — Other Ambulatory Visit: Payer: Medicare Other

## 2013-04-25 ENCOUNTER — Ambulatory Visit: Payer: Medicare Other | Admitting: Cardiovascular Disease

## 2013-04-29 ENCOUNTER — Ambulatory Visit: Payer: Medicare Other

## 2013-04-29 ENCOUNTER — Other Ambulatory Visit: Payer: Self-pay | Admitting: *Deleted

## 2013-04-29 ENCOUNTER — Encounter: Payer: Self-pay | Admitting: Physician Assistant

## 2013-04-29 ENCOUNTER — Other Ambulatory Visit (HOSPITAL_BASED_OUTPATIENT_CLINIC_OR_DEPARTMENT_OTHER): Payer: Medicare Other

## 2013-04-29 ENCOUNTER — Ambulatory Visit (HOSPITAL_BASED_OUTPATIENT_CLINIC_OR_DEPARTMENT_OTHER): Payer: Medicare Other | Admitting: Physician Assistant

## 2013-04-29 VITALS — BP 113/51 | HR 87 | Temp 97.3°F | Resp 17 | Ht 68.0 in | Wt 158.9 lb

## 2013-04-29 DIAGNOSIS — C3490 Malignant neoplasm of unspecified part of unspecified bronchus or lung: Secondary | ICD-10-CM

## 2013-04-29 DIAGNOSIS — C3410 Malignant neoplasm of upper lobe, unspecified bronchus or lung: Secondary | ICD-10-CM

## 2013-04-29 DIAGNOSIS — C787 Secondary malignant neoplasm of liver and intrahepatic bile duct: Secondary | ICD-10-CM

## 2013-04-29 DIAGNOSIS — J4489 Other specified chronic obstructive pulmonary disease: Secondary | ICD-10-CM

## 2013-04-29 DIAGNOSIS — J449 Chronic obstructive pulmonary disease, unspecified: Secondary | ICD-10-CM

## 2013-04-29 DIAGNOSIS — T451X5A Adverse effect of antineoplastic and immunosuppressive drugs, initial encounter: Secondary | ICD-10-CM

## 2013-04-29 DIAGNOSIS — D6481 Anemia due to antineoplastic chemotherapy: Secondary | ICD-10-CM

## 2013-04-29 LAB — CBC WITH DIFFERENTIAL/PLATELET
BASO%: 0.4 % (ref 0.0–2.0)
BASOS ABS: 0 10*3/uL (ref 0.0–0.1)
EOS ABS: 0.1 10*3/uL (ref 0.0–0.5)
EOS%: 0.8 % (ref 0.0–7.0)
HEMATOCRIT: 30.1 % — AB (ref 38.4–49.9)
HEMOGLOBIN: 10.2 g/dL — AB (ref 13.0–17.1)
LYMPH%: 7.1 % — ABNORMAL LOW (ref 14.0–49.0)
MCH: 37 pg — AB (ref 27.2–33.4)
MCHC: 34 g/dL (ref 32.0–36.0)
MCV: 108.7 fL — AB (ref 79.3–98.0)
MONO#: 0.7 10*3/uL (ref 0.1–0.9)
MONO%: 8 % (ref 0.0–14.0)
NEUT%: 83.7 % — ABNORMAL HIGH (ref 39.0–75.0)
NEUTROS ABS: 7.8 10*3/uL — AB (ref 1.5–6.5)
Platelets: 105 10*3/uL — ABNORMAL LOW (ref 140–400)
RBC: 2.77 10*6/uL — ABNORMAL LOW (ref 4.20–5.82)
RDW: 25.3 % — AB (ref 11.0–14.6)
WBC: 9.3 10*3/uL (ref 4.0–10.3)
lymph#: 0.7 10*3/uL — ABNORMAL LOW (ref 0.9–3.3)

## 2013-04-29 LAB — COMPREHENSIVE METABOLIC PANEL (CC13)
ALBUMIN: 4 g/dL (ref 3.5–5.0)
ALK PHOS: 85 U/L (ref 40–150)
ALT: 15 U/L (ref 0–55)
AST: 17 U/L (ref 5–34)
Anion Gap: 8 mEq/L (ref 3–11)
BUN: 10.5 mg/dL (ref 7.0–26.0)
CO2: 35 mEq/L — ABNORMAL HIGH (ref 22–29)
CREATININE: 0.8 mg/dL (ref 0.7–1.3)
Calcium: 9.6 mg/dL (ref 8.4–10.4)
Chloride: 98 mEq/L (ref 98–109)
GLUCOSE: 128 mg/dL (ref 70–140)
POTASSIUM: 4.3 meq/L (ref 3.5–5.1)
Sodium: 142 mEq/L (ref 136–145)
Total Bilirubin: 0.95 mg/dL (ref 0.20–1.20)
Total Protein: 6.8 g/dL (ref 6.4–8.3)

## 2013-04-29 NOTE — Progress Notes (Signed)
Hospice referral called to hospice of [facility]

## 2013-04-29 NOTE — Progress Notes (Addendum)
[facility] [phone]   Fax:([phone]) [place], [place] [address]  PRINCIPAL DIAGNOSIS: Recurrent small cell lung cancer initially diagnosed as Limited-stage small cell lung cancer diagnosed in April of 2012.   PRIOR THERAPY:  1. Status post systemic chemotherapy with carboplatin and etoposide, last dose was given 09/30/2010. This was concurrent with radiotherapy under the care of Dr. Pablo Ledger. 2. Status post prophylactic cranial irradiation completed on November 29, 2010.  CURRENT THERAPY: Systemic chemotherapy again with carboplatin for AUC of 5 on day 1 and etoposide 100 mg/M2 on days 1, 2 and 3 with Neulasta support on day 4. First  dose given on 01/14/2013. Status post 5 cycles.  CHEMOTHERAPY INTENT: Palliative  CURRENT # OF CHEMOTHERAPY CYCLES: 5 CURRENT ANTIEMETICS: Zofran, dexamethasone and Compazine  CURRENT SMOKING STATUS: Former smoker  ORAL CHEMOTHERAPY AND CONSENT: None  CURRENT BISPHOSPHONATES USE: None  PAIN MANAGEMENT: 0/10  NARCOTICS INDUCED CONSTIPATION: None  LIVING WILL AND CODE STATUS: ?   INTERVAL HISTORY: [name] 80 y.o. male returns to the clinic today for follow up visit accompanied by his wife and stepson and his wife. He was scheduled for cycle 6 of his systemic chemotherapy today however he presents stating that he prefers to discontinue chemotherapy at this time. He prefers to concentrate on quality of life versus quantity. His family is in agreement with this decision. His health is also complicated by his COPD and other comorbidities. The patient tolerated the last cycle of his treatment fairly well except for the fatigue secondary to chemotherapy-induced anemia. The patient denied having any significant fever or chills. He denied having any nausea or vomiting. He has no chest pain but continues to have shortness breath with exertion secondary  to severe COPD but no cough or hemoptysis. The patient denied having any significant weight loss or night sweats.   MEDICAL HISTORY: Past Medical History  Diagnosis Date  . Hypertension   . Heart attack 1991    Inferior- wall MI  treated with emergency PTCA with RCA by Dr. Gwenlyn Found, with a perfusion time of 1 hour 55 minutes.  . Emphysema   . Hyperlipidemia   . Allergic rhinitis   . Heart failure   . Cholelithiasis   . Cardiac pacemaker     Dule-chamber permanent pacemaker - [employer] RF device model # [id number], serial # [id number].  Marland Kitchen COPD (chronic obstructive pulmonary disease)     Longstanding COPD. Uses oxygen - 4 L per minute.  . SOB (shortness of breath)     Transthoracic Echo 04/24/12 EF = 55-60%. Showed sinus rhythm with left atrial abnormality, left axis deviation, and almost an atypical pulmonary disease pattern.   . Paroxysmal a-fib     Permanent Transvenous pacemaker insertion by Dr. Elisabeth Cara in 2012 and is followed by Dr. Sallyanne Kuster.  . Coronary artery disease     Coronary bypass graft in 2000 with a LIMA to his LAD, vein to obtuse marginal branch #1 and to the PDA. Myoview stress test 03/10/09 was nonischemic. Myocardial perfusion Imaging -04/24/12 - Low risk stress nuclear study. No evidence of ischemia, however the fixed perfusion defect is more prominent.   . Carotid artery disease     Had a left carotid endarterectomy with Dopplers performed in 03/2011 that showed a widely patent left endarterectomy site with no significant disease on the right.  . Oxygen dependent  Longstanding COPD - wear 4 L per minute  . Diabetes     non-insulin-requiring diabetes  . Duodenitis 01/30/11  . Cancer     Small cell lung cancer. Received chemotherapy and radiation therapy.     ALLERGIES:  has No Known Allergies.  MEDICATIONS:  Current Outpatient Prescriptions  Medication Sig Dispense Refill  . acetaminophen (TYLENOL) 325 MG tablet Take 650 mg by mouth every 6 (six) hours as  needed.        Marland Kitchen albuterol (PROVENTIL HFA;VENTOLIN HFA) 108 (90 BASE) MCG/ACT inhaler Inhale 2 puffs into the lungs every 6 (six) hours as needed for wheezing.      Marland Kitchen aspirin EC 325 MG tablet Take 325 mg by mouth daily.       . calcium acetate (PHOSLO) 667 MG capsule Take 667 mg by mouth.      . Chlorphen-Pseudoephed-APAP (CORICIDIN D PO) Take 2 tablets by mouth daily as needed.       . clopidogrel (PLAVIX) 75 MG tablet Take 1 tablet by mouth daily.      . DiphenhydrAMINE HCl (BENADRYL ALLERGY PO) Take 1 tablet by mouth as needed.      . Fluticasone-Salmeterol (ADVAIR DISKUS) 100-50 MCG/DOSE AEPB Inhale 1 puff into the lungs 2 (two) times daily.  60 each  5  . isosorbide mononitrate (IMDUR) 30 MG 24 hr tablet Take 1 tablet by mouth every morning.       Marland Kitchen losartan-hydrochlorothiazide (HYZAAR) 50-12.5 MG per tablet Take 1 tablet by mouth daily.  90 tablet  3  . metoprolol tartrate (LOPRESSOR) 25 MG tablet Take 12.5 mg by mouth 2 (two) times daily. 12.55m BID      . Multiple Vitamin (MULTIVITAMIN) tablet Take 1 tablet by mouth daily.        . OXYGEN-HELIUM IN Inhale 6 L into the lungs continuous.      .Marland KitchenPhenyleph-CPM-DM-Aspirin (ALKA-SELTZER PLUS COLD & COUGH PO) Take 2 tablets by mouth daily.      . simvastatin (ZOCOR) 20 MG tablet Take 20 mg by mouth at bedtime.        .Marland Kitchentiotropium (SPIRIVA HANDIHALER) 18 MCG inhalation capsule Place 1 capsule (18 mcg total) into inhaler and inhale daily.  30 capsule  5  . UNABLE TO FIND Med Name: prostate supplement      . EQ ALLERGY RELIEF 10 MG tablet TAKE ONE TABLET BY MOUTH ONCE DAILY  30 tablet  3  . feeding supplement (GLUCERNA SHAKE) LIQD Take 237 mLs by mouth 2 (two) times daily before a meal.        . lidocaine-prilocaine (EMLA) cream Apply topically as needed. Apply to port 1 hr before chemotherapy  30 g  0  . nitroGLYCERIN (NITROSTAT) 0.4 MG SL tablet Place 0.4 mg under the tongue every 5 (five) minutes as needed.        . prochlorperazine (COMPAZINE)  10 MG tablet Take 1 tablet (10 mg total) by mouth every 6 (six) hours as needed.  30 tablet  1  . Respiratory Therapy Supplies (FLUTTER) DEVI Use as directed  1 each  0   No current facility-administered medications for this visit.    SURGICAL HISTORY:  Past Surgical History  Procedure Laterality Date  . Pacemaker insertion  2012    For prolonged sinus pause.  .Sol Passerplacement4/18/12 tip svc    . Carotid endarterectomy  03/2011    With Dopplers performed most recently in 03/2011 that showed a widely patent left endarterectomy site with  no significant disease on the right.  . Coronary artery bypass graft  2000    CAD - CABG with a LIMA to his LAD, a vein to the obtuse marginal branch #1 and to the PDA  . Cardiac catheterization  06/2010    Unstable angina - Catheterized by Dr. Claiborne Billings was noted to have essentially total occlusion of the native coronary arteries, a patent LIMA to the LAD & revealing a 70% stenosis at the origin of his circumflex and obtuse marginal graft, an 80% PLA lesion beyond graft insertion. Stent in distal right coronary artery. Predilated with 2.5 x 20 TREK balloon. Stent-Vision 2.75 x 23 balloon.  . Coronary angioplasty Right 06/2010    Unstable angina - Predilated with a 2.5 x 20 TREK balloon. Distal right coronary artery via  the saphenous vein graft with placement of a bare metal stent (a Vision 2.75 x 23 mm by Dr. Rex Kras)    REVIEW OF SYSTEMS:  Constitutional: positive for fatigue Eyes: negative Ears, nose, mouth, throat, and face: negative Respiratory: positive for dyspnea on exertion and generalized increased shortness of breath Cardiovascular: positive for lower extremity edema Gastrointestinal: negative Genitourinary:negative Integument/breast: negative Hematologic/lymphatic: negative Musculoskeletal:negative Neurological: negative Behavioral/Psych: negative Endocrine: negative Allergic/Immunologic: negative   PHYSICAL EXAMINATION: General  appearance: alert, cooperative, fatigued and no distress Head: Normocephalic, without obvious abnormality, atraumatic Neck: no adenopathy, no JVD, supple, symmetrical, trachea midline and thyroid not enlarged, symmetric, no tenderness/mass/nodules Lymph nodes: Cervical, supraclavicular, and axillary nodes normal. Resp: clear to auscultation bilaterally Back: symmetric, no curvature. ROM normal. No CVA tenderness. Cardio: regular rate and rhythm, S1, S2 normal, no murmur, click, rub or gallop GI: soft, non-tender; bowel sounds normal; no masses,  no organomegaly Extremities: extremities normal, atraumatic, no cyanosis or edema Neurologic: Alert and oriented X 3, normal strength and tone. Normal symmetric reflexes. Normal coordination and gait  ECOG PERFORMANCE STATUS: 2 - Symptomatic, <50% confined to bed  Blood pressure 113/51, pulse 87, temperature 97.3 F (36.3 C), temperature source Oral, resp. rate 17, height _0  (1.727 m), weight 158 lb 14.4 oz (72.077 kg), SpO2 95.00%, peak flow 6 L/min.  LABORATORY DATA: Lab Results  Component Value Date   WBC 9.3 04/29/2013   HGB 10.2* 04/29/2013   HCT 30.1* 04/29/2013   MCV 108.7* 04/29/2013   PLT 105* 04/29/2013      Chemistry      Component Value Date/Time   NA 142 04/29/2013 1110   NA 138 11/28/2012 1152   NA 137 11/21/2011 0945   K 4.3 04/29/2013 1110   K 4.4 11/28/2012 1152   K 4.5 11/21/2011 0945   CL 99 11/28/2012 1152   CL 100 06/21/2012 0912   CL 96* 11/21/2011 0945   CO2 35* 04/29/2013 1110   CO2 32 11/28/2012 1152   CO2 31 11/21/2011 0945   BUN 10.5 04/29/2013 1110   BUN 20 11/28/2012 1152   BUN 18 11/21/2011 0945   CREATININE 0.8 04/29/2013 1110   CREATININE 1.08 11/28/2012 1152   CREATININE 0.89 02/22/2011 1411      Component Value Date/Time   CALCIUM 9.6 04/29/2013 1110   CALCIUM 9.3 11/28/2012 1152   CALCIUM 9.4 11/21/2011 0945   CALCIUM 7.6* 09/18/2010 1136   ALKPHOS 85 04/29/2013 1110   ALKPHOS 50 11/21/2011 0945   ALKPHOS 70  02/22/2011 1411   AST 17 04/29/2013 1110   AST 24 11/21/2011 0945   AST 13 02/22/2011 1411   ALT 15 04/29/2013 1110   ALT 26  11/21/2011 0945   ALT 9 02/22/2011 1411   BILITOT 0.95 04/29/2013 1110   BILITOT 1.10 11/21/2011 0945   BILITOT 0.5 02/22/2011 1411       RADIOGRAPHIC STUDIES: Ct Chest W Contrast  04/07/2013   CLINICAL DATA:  Small cell lung cancer. Restaging. Diagnosed in 2012 with ongoing chemotherapy. Radiation therapy to lungs and brain. Worsening shortness of Breath.  EXAM: CT CHEST, ABDOMEN, AND PELVIS WITH CONTRAST  TECHNIQUE: Multidetector CT imaging of the chest, abdomen and pelvis was performed following the standard protocol during bolus administration of intravenous contrast.  CONTRAST:  128m OMNIPAQUE IOHEXOL 300 MG/ML  SOLN  COMPARISON:  02/21/2013  FINDINGS: CT CHEST FINDINGS  Lungs/Pleura: Mild motion degradation throughout. Severe centrilobular emphysema. Similar radiation changes in the left paramediastinal upper lobe. Spiculated nodule in the superior segment left lower lobe which measures maximally 1.5 x 1.3 cm on transverse image 14/series 4. Similar on the prior. This appears less masslike on sagittal reformatted image 77.  No pleural fluid.  Heart/Mediastinum: No supraclavicular adenopathy. A left-sided Port-A-Cath which terminates at the mid SVC. Dual lead pacer.  Prior median sternotomy. Mild cardiomegaly, without pericardial effusion. No central pulmonary embolism, on this non-dedicated study. No mediastinal or hilar adenopathy.  CT ABDOMEN AND PELVIS FINDINGS  Abdomen/Pelvis: Index hepatic dome metastasis at 1.8 cm on image 49 versus 3.6 cm on the prior exam. Just posterior to this, a hepatic dome 1.8 cm lesion on image 49 is decreased from 2.4 cm on the prior.  1.7 cm right hepatic lobe lesion on image 57 is decreased from 2.2 cm on the prior.  Motion degradation continuing into the abdomen and upper pelvis.  Normal spleen, stomach. Suspect a nonobstructive lipoma on  the transverse duodenum at 8 mm on image 73.  Grossly normal pancreas, right adrenal gland. Cholelithiasis without acute cholecystitis or biliary ductal dilatation.  Left adrenal enlargement at maximally 2.3 cm versus 2.4 cm on the prior. This is been present back to 10/13/2010.  Scarred lower pole right kidney. An interpolar right renal lesion is most consistent with a cyst.  Aortic and branch vessel atherosclerosis. No retroperitoneal or retrocrural adenopathy. Scattered colonic diverticula. Normal terminal ileum and appendix. Normal small bowel without abdominal ascites. Right greater than left bilateral fat containing inguinal hernias. No pelvic adenopathy. Normal urinary bladder. Moderate prostatomegaly. No significant free fluid. Bilateral L5 pars defects  Bones/Musculoskeletal: Bilateral L5 pars defects. No acute osseous abnormality. Grade 1 L5-S1 anterolisthesis.    IMPRESSION: CT CHEST IMPRESSION  1. Motion degraded exam. 2. Emphysema and radiation changes on the left. 3. Spiculated opacity in the superior segment left lower lobe is unchanged. This is less masslike when correlated with reformatted images. Considerations include evolving focal radiation change versus metachronous primary bronchogenic carcinoma.    CT ABDOMEN AND PELVIS IMPRESSION  1. Motion degraded exam. 2. Response to therapy of hepatic metastasis. 3. Similar left adrenal enlargement. Given chronicity, and low density on 07/08/2010 PET, likely due to an underlying adenoma. Recommend attention on follow-up to exclude unlikely "Collision tumor" .   Electronically Signed   By: KAbigail MiyamotoM.D.   On: 04/07/2013 16:14   ASSESSMENT AND PLAN:  This is a very pleasant 80years old white male recently diagnosed with recurrent small cell lung cancer with multiple liver metastases and he is undergoing systemic chemotherapy with carboplatin and etoposide status post 5 cycles. The patient is feeling fine today except for the fatigue and  shortness of breath secondary to COPD. His  last CT scan of the chest, abdomen and pelvis showed further significant improvement in his disease especially in the liver. Patient was discussed with also seen by Dr. Julien Nordmann. We had a long discussion with the patient as well as his family members regarding his decision to discontinue chemotherapy. Where supported this decision. Recommend the patient consider hospice and palliative care at this time. Patient is in agreement and exploring hospice and palliative care therapy. He To return in 3 months for a restaging CT scan to reevaluate his disease. He will be due for flushing his Port-A-Cath in approximately 6 weeks, this can be handled by hospice care. He'll followup with Dr. Julien Nordmann in 3 months with a repeat CBC differential, C. met and CT of the chest, abdomen and pelvis with contrast to reevaluate his disease.  He was advised to call immediately if he has any concerning symptoms in the interval  The patient voices understanding of current disease status and treatment options and is in agreement with the current care plan.  All questions were answered. The patient knows to call the clinic with any problems, questions or concerns. We can certainly see the patient much sooner if necessary.  Carlton Adam PA-C  ADDENDUM: Hematology/Oncology Attending: I had the face to face encounter with the patient. I recommended his care plan. This is a very pleasant 79 years old white male with recurrent small cell lung cancer initially diagnosed in April 2012. He recently completed 5 cycles of systemic chemotherapy with carboplatin and etoposide with significant improvement in his disease. The patient continues to have fatigue secondary to chemotherapy-induced anemia. He also has persistent shortness of breath for several years even before his diagnosis with the lung cancer most likely secondary to his history of COPD. The patient was seen recently by his  pulmonologist Dr. Lamonte Sakai will strongly recommended for him to consider palliative care and hospice. The patient and his family are here today for evaluation and discussion of this option. I explained to the patient and his family that he is a good candidate for hospice referral because of his history of lung cancer as well as the end stage COPD.  He is still like to come back for follow up visit in 3 for evaluation with repeat CT scans. I will arrange for the patient to have the scan and follow up visit at that time. He was advised to call if he has any concerning symptoms in the interval.  Disclaimer: This note was dictated with voice recognition software. Similar sounding words can inadvertently be transcribed and may not be corrected upon review. Eilleen Kempf., MD 04/29/2013

## 2013-04-29 NOTE — Patient Instructions (Signed)
You have decided to discontinue chemotherapy at this time At your request a Hospice and [facility] has been called for you Follow with Dr. Julien Nordmann in 3 months with a restaging CT scan of your chest, abdomen and pelvis to re-evaluate your disease

## 2013-04-30 ENCOUNTER — Telehealth: Payer: Self-pay | Admitting: Internal Medicine

## 2013-04-30 ENCOUNTER — Ambulatory Visit: Payer: Medicare Other

## 2013-04-30 NOTE — Telephone Encounter (Signed)
Thank you for letting me know.  [name] is there anything I can do to be helpful/supportive?

## 2013-04-30 NOTE — Telephone Encounter (Signed)
s/w wife re appts for 4/17 and 4/20. ct already on schedule for 4/17 also and this appt d/t/location and instructions were confirmed w/wife. wife will get prep prior to 4/17.per AJ all other exisiting appts cx'd

## 2013-05-01 ENCOUNTER — Ambulatory Visit: Payer: Medicare Other

## 2013-05-02 ENCOUNTER — Ambulatory Visit: Payer: Medicare Other

## 2013-05-05 ENCOUNTER — Ambulatory Visit (INDEPENDENT_AMBULATORY_CARE_PROVIDER_SITE_OTHER): Payer: Medicare Other | Admitting: Ophthalmology

## 2013-05-05 ENCOUNTER — Ambulatory Visit (INDEPENDENT_AMBULATORY_CARE_PROVIDER_SITE_OTHER): Payer: Self-pay | Admitting: Ophthalmology

## 2013-05-06 ENCOUNTER — Other Ambulatory Visit: Payer: Medicare Other

## 2013-05-07 ENCOUNTER — Ambulatory Visit: Payer: Medicare Other | Admitting: Emergency Medicine

## 2013-05-09 ENCOUNTER — Telehealth: Payer: Self-pay | Admitting: Emergency Medicine

## 2013-05-09 NOTE — Telephone Encounter (Signed)
I called and spoke with [name] and made her aware. She will refax this to triage. Will forward to [facility]

## 2013-05-09 NOTE — Telephone Encounter (Signed)
lmomtcb x1 for [employer]

## 2013-05-09 NOTE — Telephone Encounter (Signed)
I have received fax from [facility] and will have [place] fill out and sign 05/13/13 when he returns to the office

## 2013-05-09 NOTE — Telephone Encounter (Signed)
Spoke with Orlando Penner  She is calling to see if we received order for RB to sign for o2  I advised will check with his nurse and call her back to have her re fax if we don't have it   Meghan, please advise if you have the form, thanks!

## 2013-05-09 NOTE — Telephone Encounter (Signed)
I have not seen any paperwork on this patient thanks

## 2013-05-13 ENCOUNTER — Telehealth: Payer: Self-pay | Admitting: Emergency Medicine

## 2013-05-13 ENCOUNTER — Other Ambulatory Visit: Payer: Medicare Other

## 2013-05-13 NOTE — Telephone Encounter (Signed)
Advised [name] at [facility] that we had rx and RB would sign today and I would fax back to there office

## 2013-05-13 NOTE — Telephone Encounter (Signed)
This order has been signed by RB and faxed back to [facility] Nothing further is needed

## 2013-05-14 ENCOUNTER — Telehealth: Payer: Self-pay | Admitting: Medical Oncology

## 2013-05-14 NOTE — Telephone Encounter (Signed)
Port will not aspirate. He is getting routine port flushes. I told her to flush port with saline and heparin per their protocol and to call for further problems. BP lower , wife stooped pm dose of lopresser . I told RN to call Provider who order BP meds to consider stopping.

## 2013-05-20 ENCOUNTER — Other Ambulatory Visit: Payer: Medicare Other

## 2013-05-20 ENCOUNTER — Ambulatory Visit: Payer: Medicare Other

## 2013-05-21 ENCOUNTER — Telehealth: Payer: Self-pay | Admitting: Cardiology

## 2013-05-21 ENCOUNTER — Ambulatory Visit: Payer: Medicare Other

## 2013-05-21 NOTE — Telephone Encounter (Signed)
He blood pressure have been declining especially at night. It have been in the 90/50 range.His wife have been holding the Lopressor doseage in the evening. Would you like to consider parameters for the doses?He is a stage 4 cancer patient.

## 2013-05-21 NOTE — Telephone Encounter (Signed)
INFORMED ON CALL ANSWER SERVICE TO HAVE CALL TOMORROW -INFORMATION  WLL BE GIVEN

## 2013-05-21 NOTE — Telephone Encounter (Signed)
I think holding the evening dose is fine. If he can take it, great, but if not I am okay with him holding the dose.  Leonie Man, MD

## 2013-05-21 NOTE — Telephone Encounter (Signed)
Forward to Dr. Harding 

## 2013-05-22 ENCOUNTER — Ambulatory Visit: Payer: Medicare Other

## 2013-05-22 NOTE — Telephone Encounter (Signed)
I spoke with the hospice nurse.  [name] is not symptomatic with the BP in the 90s/.  The wife is concerned b/c it is low.  I advised, per Dr Allison Quarry recommendations, to hold the evening dose of lopressor.  If the patient becomes symptomatic or if SBP <90 call us back. Hospice nurse voiced understanding.

## 2013-05-22 NOTE — Telephone Encounter (Signed)
Returned call. Left message to call back.

## 2013-05-22 NOTE — Telephone Encounter (Signed)
Call r/t answering service.  Stated she will have the message sent to the day nurses b/c they are just coming on.

## 2013-05-23 ENCOUNTER — Telehealth: Payer: Self-pay | Admitting: Medical Oncology

## 2013-05-23 ENCOUNTER — Other Ambulatory Visit: Payer: Self-pay | Admitting: Medical Oncology

## 2013-05-23 ENCOUNTER — Ambulatory Visit: Payer: Medicare Other

## 2013-05-23 NOTE — Telephone Encounter (Signed)
Returned call to [place] had question about albuterol order. Left message to call back.

## 2013-05-23 NOTE — Telephone Encounter (Signed)
reviewed med list with Roland Rack , RN [facility].

## 2013-05-27 ENCOUNTER — Other Ambulatory Visit: Payer: Medicare Other

## 2013-05-28 ENCOUNTER — Telehealth: Payer: Self-pay | Admitting: Emergency Medicine

## 2013-05-28 NOTE — Telephone Encounter (Signed)
Maura at [facility] had a question on should he be on albuterol neb or just inhaler. I advised her that I did not have nebs on list or in OV notes, so only inhaler. Will leave flutter at front desk, since pt has misplaced and explain to her how to use.

## 2013-05-29 ENCOUNTER — Encounter: Payer: Medicare Other | Admitting: *Deleted

## 2013-06-02 ENCOUNTER — Encounter: Payer: Self-pay | Admitting: *Deleted

## 2013-06-03 ENCOUNTER — Telehealth: Payer: Self-pay | Admitting: *Deleted

## 2013-06-03 ENCOUNTER — Encounter: Payer: Self-pay | Admitting: *Deleted

## 2013-06-03 DIAGNOSIS — Z515 Encounter for palliative care: Secondary | ICD-10-CM | POA: Insufficient documentation

## 2013-06-03 NOTE — Telephone Encounter (Signed)
Cristela Blue called reporting patient had extreme dyspnea over the weekend.  Dr. Marin Olp ordered nebulizer treatments.  He also has liquid morphine 0.25 ml dose q2 hrs prn s.o.b. or pain.    Dr. Julien Nordmann notified.  Verbal order received and read back from Dr. Julien Nordmann for Hospice to contact Pulmonologist for any pulmonary problems as he has history of high risk COPD. Called maura and left this information/instructions on her voicemail.

## 2013-06-06 ENCOUNTER — Telehealth: Payer: Self-pay | Admitting: Cardiovascular Disease

## 2013-06-06 NOTE — Telephone Encounter (Signed)
Message forwarded to Brita Romp, RN to discuss w/ Dr. Gwenlyn Found.  No f/u appts scheduled at this time.

## 2013-06-06 NOTE — Telephone Encounter (Signed)
Wife just wanted you to know he has liver cancer and is under the care of Hospice.He will not be coming for his appointments

## 2013-06-09 ENCOUNTER — Telehealth: Payer: Self-pay | Admitting: Medical Oncology

## 2013-06-09 ENCOUNTER — Telehealth: Payer: Self-pay | Admitting: Cardiovascular Disease

## 2013-06-09 NOTE — Telephone Encounter (Signed)
Spoke w/wife and wife to send manuel transmission this pm/kwm

## 2013-06-09 NOTE — Telephone Encounter (Signed)
Follow up    Pt's wife called after getting letter she wants to make sure we got it.   Pt's heart rate has been  134-150 for about a week now.   Please give her a call back she has some questions.

## 2013-06-09 NOTE — Telephone Encounter (Signed)
New message     Pt got a letter stating he did not transmit remotely.  They have a wireless box and has no idea what the letter means

## 2013-06-09 NOTE — Telephone Encounter (Signed)
Spoke w/wife and answered all questions/kwm

## 2013-06-09 NOTE — Telephone Encounter (Signed)
[name] called to report that the Family requests DNR paper to be in home. Pt is having exacerbation of his dyspnea . Per Dr Julien Nordmann that is okay . I called this to [facility].

## 2013-06-10 ENCOUNTER — Telehealth: Payer: Self-pay | Admitting: *Deleted

## 2013-06-10 ENCOUNTER — Telehealth: Payer: Self-pay | Admitting: Emergency Medicine

## 2013-06-10 ENCOUNTER — Ambulatory Visit: Payer: Medicare Other | Admitting: Cardiovascular Disease

## 2013-06-10 NOTE — Telephone Encounter (Signed)
Maura from hospice called in regards to [name] 3+ pitting edema, possible weight gain, he does have liver cancer. BP and pulse is ok.  JB

## 2013-06-10 NOTE — Telephone Encounter (Signed)
Returned call to [facility].  Stated pt w/ 2 lbs wt gain, BP WNL and lungs clear but diminished.  BP 106/70 HR 75.  Informed this RN spoke w/ Curt Bears, RN (Dr. Kennon Holter nurse) and she received information from their office and will have Dr. Gwenlyn Found review tomorrow.  Informed Cristela Blue that this information will be shared as well and Curt Bears will contact her if there are any new orders or updates.  Maura verbalized understanding.  Stated pt's oncologist signed hospice orders and their doctors are willing to do symptom management if needed.  Message forwarded to Curt Bears, RN to discuss w/ Dr. Gwenlyn Found.

## 2013-06-10 NOTE — Telephone Encounter (Signed)
Will forward message to RB as a FYI

## 2013-06-19 ENCOUNTER — Telehealth: Payer: Self-pay | Admitting: Cardiovascular Disease

## 2013-06-19 NOTE — Telephone Encounter (Signed)
Please advise 

## 2013-06-19 NOTE — Telephone Encounter (Signed)
Brandon Melnick, Hospice nurse, called.  Stated pt has liver cancer and is showing signs of progression.  Stated pt's family has requested no further f/u appts.  Also stated pt is taking Plavix and ASA and had a small bump w/ bruising and their doctor wants to know if pt's meds can be reviewed to determine which can be stopped.  Stated pt is very weak.  Maura informed Scheduling will be notified to cancel appts/recall and Dr. Gwenlyn Found will be notified for further instructions.  Informed she will be notified once a response is given.  Verbalized understanding and agreed w/ plan.  Otila Kluver in Scheduling notified and Recall for May deleted  Message forwarded to Curt Bears, [place] to discuss w/ Dr. Gwenlyn Found.

## 2013-06-20 NOTE — Telephone Encounter (Signed)
Left message for [name] about Dr Kennon Holter recommendations

## 2013-06-20 NOTE — Telephone Encounter (Signed)
Patient can stop his aspirin products

## 2013-07-02 ENCOUNTER — Telehealth: Payer: Self-pay | Admitting: Medical Oncology

## 2013-07-02 NOTE — Telephone Encounter (Signed)
Hospice nurse, Brandon Melnick, called to update office regarding patient's condition. States patient "is in the transition period, prior to active dying, declining rapidly with major changes, presents with tachycardia, de-sating on 5 liters O2 with O2 saturation in the low 80s, having apnea and is now sleeping in recliner, having skin color changes, as well as periods of wakefulness. States he is comfortable." Family wished to extend appreciation to Dr Earlie Server and Shauna Hugh and [place] for all they have done.  Nurse reports patient receiving 1 ml q2 hours as needed of Roxinal.  Message given to Dr. Earlie Server.

## 2013-07-08 ENCOUNTER — Telehealth: Payer: Self-pay | Admitting: Emergency Medicine

## 2013-07-08 NOTE — Telephone Encounter (Signed)
I will forward as an FYI. Pt passed away yesterday. 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

## 2013-07-09 DEATH — deceased

## 2013-07-23 ENCOUNTER — Telehealth: Payer: Self-pay | Admitting: Cardiovascular Disease

## 2013-07-23 NOTE — Telephone Encounter (Signed)
Still need orders that was sent over on 3-24,3-31and 4-8.Please fax this back asap to [phone].

## 2013-07-23 NOTE — Telephone Encounter (Signed)
Message forwarded to Kathryn, RN.  

## 2013-07-24 NOTE — Telephone Encounter (Signed)
Forms signed and faxed back.

## 2013-07-25 ENCOUNTER — Ambulatory Visit (HOSPITAL_COMMUNITY): Payer: Medicare Other

## 2013-07-25 ENCOUNTER — Other Ambulatory Visit: Payer: Medicare Other

## 2013-07-28 ENCOUNTER — Ambulatory Visit: Payer: Medicare Other | Admitting: Internal Medicine

## 2013-08-19 NOTE — Telephone Encounter (Signed)
Close encounter 

## 2013-11-05 ENCOUNTER — Encounter: Payer: Self-pay | Admitting: Cardiovascular Disease

## 2013-12-05 ENCOUNTER — Other Ambulatory Visit: Payer: Self-pay | Admitting: Pharmacist
